# Patient Record
Sex: Female | Born: 1965
Health system: Southern US, Community
[De-identification: ages and names within clinical notes are randomized; demographics above are authoritative.]

## PROBLEM LIST (undated history)

## (undated) DIAGNOSIS — R51 Headache: Secondary | ICD-10-CM

## (undated) DIAGNOSIS — F209 Schizophrenia, unspecified: Secondary | ICD-10-CM

## (undated) DIAGNOSIS — Z973 Presence of spectacles and contact lenses: Secondary | ICD-10-CM

## (undated) DIAGNOSIS — N840 Polyp of corpus uteri: Secondary | ICD-10-CM

## (undated) DIAGNOSIS — F32A Depression, unspecified: Secondary | ICD-10-CM

## (undated) DIAGNOSIS — M545 Low back pain, unspecified: Secondary | ICD-10-CM

## (undated) DIAGNOSIS — R519 Headache, unspecified: Secondary | ICD-10-CM

## (undated) DIAGNOSIS — F200 Paranoid schizophrenia: Secondary | ICD-10-CM

## (undated) DIAGNOSIS — F329 Major depressive disorder, single episode, unspecified: Secondary | ICD-10-CM

## (undated) DIAGNOSIS — I8390 Asymptomatic varicose veins of unspecified lower extremity: Secondary | ICD-10-CM

## (undated) DIAGNOSIS — F419 Anxiety disorder, unspecified: Secondary | ICD-10-CM

## (undated) DIAGNOSIS — R7303 Prediabetes: Secondary | ICD-10-CM

## (undated) DIAGNOSIS — K5909 Other constipation: Secondary | ICD-10-CM

## (undated) DIAGNOSIS — F319 Bipolar disorder, unspecified: Secondary | ICD-10-CM

## (undated) DIAGNOSIS — G8929 Other chronic pain: Secondary | ICD-10-CM

## (undated) HISTORY — PX: NO PAST SURGERIES: SHX2092

## (undated) HISTORY — DX: Depression, unspecified: F32.A

## (undated) HISTORY — DX: Low back pain: M54.5

## (undated) HISTORY — DX: Prediabetes: R73.03

## (undated) HISTORY — DX: Paranoid schizophrenia: F20.0

## (undated) HISTORY — DX: Headache: R51

## (undated) HISTORY — DX: Schizophrenia, unspecified: F20.9

## (undated) HISTORY — DX: Low back pain, unspecified: M54.50

## (undated) HISTORY — DX: Major depressive disorder, single episode, unspecified: F32.9

---

## 2000-10-18 ENCOUNTER — Emergency Department (HOSPITAL_COMMUNITY): Admission: EM | Admit: 2000-10-18 | Discharge: 2000-10-18 | Payer: Self-pay | Admitting: Emergency Medicine

## 2000-10-31 ENCOUNTER — Emergency Department (HOSPITAL_COMMUNITY): Admission: EM | Admit: 2000-10-31 | Discharge: 2000-10-31 | Payer: Self-pay | Admitting: Emergency Medicine

## 2000-10-31 ENCOUNTER — Encounter: Payer: Self-pay | Admitting: Emergency Medicine

## 2000-11-24 ENCOUNTER — Other Ambulatory Visit: Admission: RE | Admit: 2000-11-24 | Discharge: 2000-11-24 | Payer: Self-pay | Admitting: Family Medicine

## 2001-07-14 ENCOUNTER — Inpatient Hospital Stay (HOSPITAL_COMMUNITY): Admission: EM | Admit: 2001-07-14 | Discharge: 2001-07-20 | Payer: Self-pay | Admitting: *Deleted

## 2002-09-01 ENCOUNTER — Other Ambulatory Visit: Admission: RE | Admit: 2002-09-01 | Discharge: 2002-09-01 | Payer: Self-pay | Admitting: Obstetrics and Gynecology

## 2003-04-04 ENCOUNTER — Emergency Department (HOSPITAL_COMMUNITY): Admission: EM | Admit: 2003-04-04 | Discharge: 2003-04-04 | Payer: Self-pay | Admitting: Emergency Medicine

## 2003-04-04 ENCOUNTER — Encounter: Payer: Self-pay | Admitting: Emergency Medicine

## 2003-04-16 ENCOUNTER — Emergency Department (HOSPITAL_COMMUNITY): Admission: EM | Admit: 2003-04-16 | Discharge: 2003-04-17 | Payer: Self-pay | Admitting: *Deleted

## 2004-01-14 ENCOUNTER — Emergency Department (HOSPITAL_COMMUNITY): Admission: EM | Admit: 2004-01-14 | Discharge: 2004-01-14 | Payer: Self-pay | Admitting: Emergency Medicine

## 2004-07-23 ENCOUNTER — Inpatient Hospital Stay (HOSPITAL_COMMUNITY): Admission: RE | Admit: 2004-07-23 | Discharge: 2004-07-27 | Payer: Self-pay | Admitting: Psychiatry

## 2004-11-28 ENCOUNTER — Ambulatory Visit: Payer: Self-pay | Admitting: Internal Medicine

## 2005-01-20 ENCOUNTER — Ambulatory Visit: Payer: Self-pay | Admitting: Internal Medicine

## 2005-04-10 ENCOUNTER — Emergency Department (HOSPITAL_COMMUNITY): Admission: EM | Admit: 2005-04-10 | Discharge: 2005-04-11 | Payer: Self-pay | Admitting: Emergency Medicine

## 2005-05-07 ENCOUNTER — Ambulatory Visit: Payer: Self-pay | Admitting: Internal Medicine

## 2005-07-22 ENCOUNTER — Ambulatory Visit: Payer: Self-pay | Admitting: Internal Medicine

## 2005-07-28 ENCOUNTER — Ambulatory Visit: Payer: Self-pay | Admitting: Internal Medicine

## 2006-08-25 ENCOUNTER — Ambulatory Visit: Payer: Self-pay | Admitting: Internal Medicine

## 2006-11-24 ENCOUNTER — Encounter: Payer: Self-pay | Admitting: Internal Medicine

## 2006-11-24 ENCOUNTER — Ambulatory Visit: Payer: Self-pay | Admitting: Internal Medicine

## 2006-11-24 ENCOUNTER — Other Ambulatory Visit: Admission: RE | Admit: 2006-11-24 | Discharge: 2006-11-24 | Payer: Self-pay | Admitting: Internal Medicine

## 2007-02-04 ENCOUNTER — Ambulatory Visit: Payer: Self-pay | Admitting: Internal Medicine

## 2007-04-12 ENCOUNTER — Ambulatory Visit: Payer: Self-pay | Admitting: Internal Medicine

## 2007-04-12 LAB — CONVERTED CEMR LAB
ALT: 18 units/L (ref 0–40)
AST: 26 units/L (ref 0–37)
Albumin: 3.6 g/dL (ref 3.5–5.2)
Alkaline Phosphatase: 48 units/L (ref 39–117)
Basophils Absolute: 0 10*3/uL (ref 0.0–0.1)
Basophils Relative: 0.6 % (ref 0.0–1.0)
Bilirubin, Direct: 0.1 mg/dL (ref 0.0–0.3)
Eosinophils Absolute: 0 10*3/uL (ref 0.0–0.6)
Eosinophils Relative: 0.7 % (ref 0.0–5.0)
HCT: 37.7 % (ref 36.0–46.0)
Hemoglobin: 13.2 g/dL (ref 12.0–15.0)
Lymphocytes Relative: 25.4 % (ref 12.0–46.0)
MCHC: 35 g/dL (ref 30.0–36.0)
MCV: 92.7 fL (ref 78.0–100.0)
Monocytes Absolute: 0.5 10*3/uL (ref 0.2–0.7)
Monocytes Relative: 11.1 % — ABNORMAL HIGH (ref 3.0–11.0)
Neutro Abs: 3.2 10*3/uL (ref 1.4–7.7)
Neutrophils Relative %: 62.2 % (ref 43.0–77.0)
Phenytoin Lvl: 0.4 ug/mL — ABNORMAL LOW (ref 10.0–20.0)
Platelets: 189 10*3/uL (ref 150–400)
RBC: 4.07 M/uL (ref 3.87–5.11)
RDW: 12.7 % (ref 11.5–14.6)
Total Bilirubin: 0.4 mg/dL (ref 0.3–1.2)
Total Protein: 6.4 g/dL (ref 6.0–8.3)
Valproic Acid Lvl: 107.5 ug/mL — ABNORMAL HIGH (ref 50.0–100.0)
WBC: 4.9 10*3/uL (ref 4.5–10.5)

## 2007-06-03 ENCOUNTER — Ambulatory Visit: Payer: Self-pay | Admitting: Internal Medicine

## 2007-06-03 LAB — CONVERTED CEMR LAB: Phenytoin Lvl: 0.7 ug/mL — ABNORMAL LOW (ref 10.0–20.0)

## 2007-06-09 ENCOUNTER — Ambulatory Visit: Payer: Self-pay | Admitting: Internal Medicine

## 2007-09-05 ENCOUNTER — Ambulatory Visit: Payer: Self-pay | Admitting: Internal Medicine

## 2007-09-05 DIAGNOSIS — M545 Low back pain, unspecified: Secondary | ICD-10-CM | POA: Insufficient documentation

## 2007-09-05 LAB — CONVERTED CEMR LAB
BUN: 6 mg/dL (ref 6–23)
CO2: 28 meq/L (ref 19–32)
Calcium: 9.4 mg/dL (ref 8.4–10.5)
Chloride: 108 meq/L (ref 96–112)
Creatinine, Ser: 1 mg/dL (ref 0.4–1.2)
GFR calc Af Amer: 79 mL/min
GFR calc non Af Amer: 65 mL/min
Glucose, Bld: 87 mg/dL (ref 70–99)
Hgb A1c MFr Bld: 5.7 % (ref 4.6–6.0)
Potassium: 3.5 meq/L (ref 3.5–5.1)
Sodium: 141 meq/L (ref 135–145)

## 2007-10-17 ENCOUNTER — Telehealth: Payer: Self-pay | Admitting: Internal Medicine

## 2007-10-25 ENCOUNTER — Telehealth (INDEPENDENT_AMBULATORY_CARE_PROVIDER_SITE_OTHER): Payer: Self-pay | Admitting: *Deleted

## 2007-12-05 ENCOUNTER — Ambulatory Visit: Payer: Self-pay | Admitting: Internal Medicine

## 2007-12-05 DIAGNOSIS — B372 Candidiasis of skin and nail: Secondary | ICD-10-CM

## 2008-01-02 ENCOUNTER — Telehealth: Payer: Self-pay | Admitting: Internal Medicine

## 2008-01-25 ENCOUNTER — Telehealth (INDEPENDENT_AMBULATORY_CARE_PROVIDER_SITE_OTHER): Payer: Self-pay | Admitting: *Deleted

## 2008-03-05 ENCOUNTER — Ambulatory Visit: Payer: Self-pay | Admitting: Internal Medicine

## 2008-03-22 ENCOUNTER — Telehealth: Payer: Self-pay | Admitting: Internal Medicine

## 2008-04-06 ENCOUNTER — Telehealth: Payer: Self-pay | Admitting: Internal Medicine

## 2008-06-04 ENCOUNTER — Ambulatory Visit: Payer: Self-pay | Admitting: Internal Medicine

## 2008-06-04 DIAGNOSIS — R51 Headache: Secondary | ICD-10-CM

## 2008-06-04 DIAGNOSIS — R519 Headache, unspecified: Secondary | ICD-10-CM | POA: Insufficient documentation

## 2008-06-04 LAB — CONVERTED CEMR LAB: Beta hcg, urine, semiquantitative: NEGATIVE

## 2008-08-13 ENCOUNTER — Telehealth: Payer: Self-pay | Admitting: Internal Medicine

## 2008-08-16 ENCOUNTER — Telehealth: Payer: Self-pay | Admitting: Internal Medicine

## 2008-10-29 ENCOUNTER — Telehealth: Payer: Self-pay | Admitting: Internal Medicine

## 2008-11-14 ENCOUNTER — Telehealth: Payer: Self-pay | Admitting: Internal Medicine

## 2008-11-23 ENCOUNTER — Ambulatory Visit: Payer: Self-pay | Admitting: Internal Medicine

## 2008-11-26 ENCOUNTER — Ambulatory Visit: Payer: Self-pay | Admitting: Internal Medicine

## 2008-11-29 LAB — CONVERTED CEMR LAB
Preg, Serum: NEGATIVE
Prolactin: 32.4 ng/mL

## 2009-04-15 ENCOUNTER — Encounter: Payer: Self-pay | Admitting: Internal Medicine

## 2009-06-12 ENCOUNTER — Ambulatory Visit: Payer: Self-pay | Admitting: Internal Medicine

## 2009-06-12 ENCOUNTER — Telehealth (INDEPENDENT_AMBULATORY_CARE_PROVIDER_SITE_OTHER): Payer: Self-pay | Admitting: *Deleted

## 2009-06-13 LAB — CONVERTED CEMR LAB
ALT: 22 units/L (ref 0–35)
AST: 25 units/L (ref 0–37)
Alkaline Phosphatase: 53 units/L (ref 39–117)
Basophils Absolute: 0 10*3/uL (ref 0.0–0.1)
Eosinophils Relative: 1.2 % (ref 0.0–5.0)
HCT: 39.8 % (ref 36.0–46.0)
Hemoglobin: 13.9 g/dL (ref 12.0–15.0)
Lymphocytes Relative: 27.8 % (ref 12.0–46.0)
Monocytes Relative: 11.9 % (ref 3.0–12.0)
Neutro Abs: 3.5 10*3/uL (ref 1.4–7.7)
Platelets: 198 10*3/uL (ref 150.0–400.0)
RDW: 12.7 % (ref 11.5–14.6)
Total Bilirubin: 0.7 mg/dL (ref 0.3–1.2)
WBC: 6 10*3/uL (ref 4.5–10.5)

## 2009-07-08 ENCOUNTER — Telehealth (INDEPENDENT_AMBULATORY_CARE_PROVIDER_SITE_OTHER): Payer: Self-pay | Admitting: *Deleted

## 2009-09-18 ENCOUNTER — Telehealth: Payer: Self-pay | Admitting: Internal Medicine

## 2010-02-21 ENCOUNTER — Ambulatory Visit: Payer: Self-pay | Admitting: Internal Medicine

## 2010-05-30 ENCOUNTER — Telehealth (INDEPENDENT_AMBULATORY_CARE_PROVIDER_SITE_OTHER): Payer: Self-pay | Admitting: *Deleted

## 2010-06-01 ENCOUNTER — Ambulatory Visit (HOSPITAL_COMMUNITY): Admission: RE | Admit: 2010-06-01 | Discharge: 2010-06-01 | Payer: Self-pay | Admitting: Psychiatry

## 2010-06-02 ENCOUNTER — Ambulatory Visit: Payer: Self-pay | Admitting: Psychiatry

## 2010-06-02 ENCOUNTER — Inpatient Hospital Stay (HOSPITAL_COMMUNITY): Admission: EM | Admit: 2010-06-02 | Discharge: 2010-06-03 | Payer: Self-pay | Admitting: Emergency Medicine

## 2010-06-02 ENCOUNTER — Encounter (INDEPENDENT_AMBULATORY_CARE_PROVIDER_SITE_OTHER): Payer: Self-pay | Admitting: Internal Medicine

## 2010-06-03 ENCOUNTER — Ambulatory Visit: Payer: Self-pay | Admitting: Psychiatry

## 2011-01-22 NOTE — Progress Notes (Signed)
Summary: FYI  Phone Note Call from Patient   Caller: Patient Call For: Stacie Glaze MD Summary of Call: Pt has been vomiting all morning, and is having multiple BMs that are formed.  No fever. [phone] Feels better. Initial call taken by: Lynann Beaver CMA,  May 30, 2010 1:21 PM  Follow-up for Phone Call        clear liquids for 48 h ours per dr j enkins Follow-up by: Willy Eddy, LPN,  May 30, 2010 1:52 PM  Additional Follow-up for Phone Call Additional follow up Details #1::        Pt. notified. Additional Follow-up by: Lynann Beaver CMA,  May 30, 2010 2:01 PM

## 2011-03-09 LAB — COMPREHENSIVE METABOLIC PANEL
ALT: 16 U/L (ref 0–35)
AST: 19 U/L (ref 0–37)
CO2: 25 mEq/L (ref 19–32)
Chloride: 107 mEq/L (ref 96–112)
GFR calc Af Amer: >60 mL/min (ref >60)
GFR calc non Af Amer: >60 mL/min (ref >60)
Sodium: 138 mEq/L (ref 135–145)
Total Bilirubin: 0.8 mg/dL (ref 0.3–1.2)

## 2011-03-09 LAB — CARDIAC PANEL(CRET KIN+CKTOT+MB+TROPI)
CK, MB: 2.3 ng/mL (ref 0.3–4.0)
Total CK: 125 U/L (ref 7–177)

## 2011-03-09 LAB — CK TOTAL AND CKMB (NOT AT ARMC)
CK, MB: 3.4 ng/mL (ref 0.3–4.0)
Relative Index: 1.9 (ref 0.0–2.5)

## 2011-03-09 LAB — LIPID PANEL
Cholesterol: 164 mg/dL (ref 0–200)
Total CHOL/HDL Ratio: 4.3 RATIO

## 2011-03-09 LAB — CBC
MCHC: 33.5 g/dL (ref 30.0–36.0)
MCV: 89.8 fL (ref 78.0–100.0)
Platelets: 208 10*3/uL (ref 150–400)
RBC: 4.07 MIL/uL (ref 3.87–5.11)
RDW: 13.4 % (ref 11.5–15.5)
WBC: 9 10*3/uL (ref 4.0–10.5)

## 2011-03-09 LAB — POCT CARDIAC MARKERS
CKMB, poc: 3.2 ng/mL (ref 1.0–8.0)
Myoglobin, poc: 95.9 ng/mL (ref 12–200)

## 2011-03-09 LAB — DIFFERENTIAL
Basophils Absolute: 0.1 10*3/uL (ref 0.0–0.1)
Basophils Relative: 1 % (ref 0–1)
Eosinophils Absolute: 0.1 10*3/uL (ref 0.0–0.7)
Monocytes Relative: 11 % (ref 3–12)
Neutrophils Relative %: 61 % (ref 43–77)

## 2011-03-09 LAB — RAPID URINE DRUG SCREEN, HOSP PERFORMED
Barbiturates: NOT DETECTED
Opiates: NOT DETECTED
Tetrahydrocannabinol: NOT DETECTED

## 2011-03-09 LAB — BASIC METABOLIC PANEL
BUN: 4 mg/dL — ABNORMAL LOW (ref 6–23)
CO2: 25 mEq/L (ref 19–32)
Calcium: 9.5 mg/dL (ref 8.4–10.5)
Chloride: 105 mEq/L (ref 96–112)
Creatinine, Ser: 0.88 mg/dL (ref 0.4–1.2)
Glucose, Bld: 106 mg/dL — ABNORMAL HIGH (ref 70–99)

## 2011-03-09 LAB — T3, FREE: T3, Free: 2.8 pg/mL (ref 2.3–4.2)

## 2011-05-05 NOTE — Assessment & Plan Note (Signed)
[facility]                                 ON-CALL NOTE   Charlotte [name], Charlotte [name]                       MRN:          [id number]  DATE:09/05/2007                            DOB:          [birth year]    Patient of Dr. Darryll Capers   PHONE NUMBER:  [phone]   Phone call came at 5:52 p.m. on September 05, 2007.  She was in for a  Depo-Provera shot today.  The nurse apparently got the wrong medication  at first, did not give it to her, told her she had the wrong medicine,  went and got the right medicine, came back and gave it to her.  She is  now concerned because she is afraid that maybe she got the wrong  medicine and is calling kind of anxious.   PLAN:  I told her that is extremely unlikely to have gotten the wrong  medicine when the nurse realized she had the wrong medicine first and  went back to get the right one.  I told her she would be really on alert  at that point and tried to reassure that would be unlikely.  I told her  that she could call tomorrow to confirm what the medicine was and the  lot number and all that if she wanted to be sure of it but I told her  that it was unlikely once the nurse had recognized an error and had not  given her the wrong medicine that she would once again get the wrong  medicine for her.     Karie Schwalbe, MD  Electronically Signed    RIL/MedQ  DD: 09/05/2007  DT: 09/06/2007  Job #: [id number]

## 2011-05-08 NOTE — Discharge Summary (Signed)
Charlotte [name], Charlotte [name]                          ACCOUNT NO.:  [id number]   MEDICAL RECORD NO.:  [id number]                   PATIENT TYPE:  IPS   LOCATION:  0500                                 FACILITY[facility]  [facility]   PHYSICIAN:  Geoffery Lyons, M.D.                   DATE OF BIRTH:  1966-01-18   DATE OF ADMISSION:  07/23/2004  DATE OF DISCHARGE:  07/27/2004                                 DISCHARGE SUMMARY   CHIEF COMPLAINT AND PRESENT ILLNESS:  This was the second admission to [facility] for this 45 year old married white female voluntarily  admitted.  History of psychosis.  Experiencing positive visual  hallucinations, seeing Mini-Wheats with faces.  Hearing three songs running  through her head all the time.  Also been seeing faces in the microwave as  well as in her bathroom.  Seeing six different faces on her curtains, the  faces of the devil, Jesus and a rock star.  She started seeing these the  Sunday before this admission.  Claimed she had been compliant with her  medication.  She claimed that she felt overwhelmed with needy friends and  neighbors.  Apparently, her husband has to get up in the middle of the night  to do different things for these people.   PAST PSYCHIATRIC HISTORY:  Second time at [facility].  Was admitted  in 2002.  Had been having thoughts at that time to kill her husband.  No  current outpatient treatment.   ALCOHOL/DRUG HISTORY:  Denies the use or abuse any substances.   PAST MEDICAL HISTORY:  Bulimia.  No other medical conditions.   MEDICATIONS:  Prolixin 5 mg twice a day, Cogentin 1 mg twice a day.   PHYSICAL EXAMINATION:  Performed and failed to show any acute findings.   LABORATORY DATA:  CBC with white blood cell count 11. 6.  Urine pregnancy  test was negative.  Urine drug screen was negative.  TSH was 2.751.   MENTAL STATUS EXAM:  Alert, cooperative female with good eye contact.  Casually dressed.  Speech was clear.  Pleasant,  does not appear to be  stressed by internal stimuli.  Very bright.  Thought processes were positive  for endorsing auditory and visual hallucinations.  No current suicidal or  homicidal ideation.  No evidence of delusional ideas.  Cognition well-  preserved.   ADMISSION DIAGNOSES:   AXIS I:  Psychotic disorder not otherwise specified.   AXIS II:  No diagnosis.   AXIS III:  No diagnosis.   AXIS IV:  Moderate.   AXIS V:  Global Assessment of Functioning upon admission 35; highest Global  Assessment of Functioning in the last year 60-65.   HOSPITAL COURSE:  She was admitted and started intensive individual and  group psychotherapy.  She was placed on Ambien at bedtime for sleep.  She  was maintained on Prolixin 5 mg  twice a day and Cogentin 1 mg twice a day.  Prolixin was increased to 5 mg three times a day.  She was given some  Klonopin for some anxiety.  Endorsed again that the main stressor was  increased demands and expectations from the neighbors and how the husband  has been responding and feeling entitled, hard time setting limits.  As she  was able to open up and discuss all these things, she claimed that she was  feeling better.  She used to use 20 mg of Prolixin and felt that does help  her more.  By August 7th, she continued to improve.  She was able to  tolerate the Prolixin well without any signs of extrapyramidal reactions.  Her mood improved.  Her affect was brighter.  Denied having any more  hallucinations.  Had been able to discuss the stressors with her husband and  they were both coming to some resolutions, setting limits.  She was looking  forward to being discharged and pursuing the relationship with the husband.  She was going to follow up on an outpatient basis.   DISCHARGE DIAGNOSES:   AXIS I:  Psychotic disorder not otherwise specified.   AXIS II:  No diagnosis.   AXIS III:  No diagnosis.   AXIS IV:  Moderate.   AXIS V:  Global Assessment of  Functioning upon discharge 55.   DISCHARGE MEDICATIONS:  1.  Prolixin 5 mg three times a day.  2.  Cogentin 1 mg twice a day.   FOLLOW UP:  Dr. Lovell Sheehan, [facility], and Dr. Lolly Mustache at [facility].                                               Geoffery Lyons, M.D.    IL/MEDQ  D:  08/20/2004  T:  08/21/2004  Job:  [id number]

## 2011-05-08 NOTE — H&P (Signed)
Charlotte [name], Charlotte [name]                          ACCOUNT NO.:  [id number]   MEDICAL RECORD NO.:  [id number]                   PATIENT TYPE:  IPS   LOCATION:  0500                                 FACILITY:  [facility]   PHYSICIAN:  Geoffery Lyons, M.D.                   DATE OF BIRTH:  05-20-1966   DATE OF ADMISSION:  07/23/2004  DATE OF DISCHARGE:                         PSYCHIATRIC ADMISSION ASSESSMENT   IDENTIFYING INFORMATION:  This is a 45 year old married white female  voluntarily admitted on July 23, 2004.   HISTORY OF PRESENT ILLNESS:  The patient presents with a history of  psychosis, experiencing positive visual hallucinations, seeing Mini-Wheats  with faces on, hearing three songs running through her head all the time.  The patient states she has also been seeing faces in the microwave as well  as in her bathroom, seeing six different faces on her curtains, with the  faces of the devil, Jesus and a rock star.  The patient states she started  seeing these the Sunday before this admission.  She reports she has been  compliant with her medications.  Her stressors include that patient feels  overwhelmed with needy friends and neighbors.  Her husband has to get up in  the middle of the night and do different things for these people.  She  states she has been sleeping well.  She does report a history of bulimia.  She reports that she, at times, wants to hurt herself when she gets angry  with the mother but is not experiencing at this time.   PAST PSYCHIATRIC HISTORY:  Second admission to [facility].  Was here  in 2002.  She states she was having thoughts at the time to kill her  husband.  No outpatient treatment.  Has a history of overdosing on Tylenol  and aspirin.  She reports she has had an overdose at least 14 times.   SOCIAL HISTORY:  This is a 45 year old married white female, married for  three years, no children.  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   FAMILY  HISTORY:  Unclear.   ALCOHOL/DRUG HISTORY:  Nonsmoker.  Denies any alcohol or drug use.   PRIMARY CARE PHYSICIAN:  Dr. Darryll Capers.   MEDICAL PROBLEMS:  The patient reports a history of bulimia.  She states she  drinks a lot of fluids right before eating and then when she does eat, she  starts to vomit.  She has not been doing anything very currently.   MEDICATIONS:  Has been on Prolixin 5 mg b.i.d. and Cogentin 1 mg b.i.d.  Again, has been compliant.   ALLERGIES:  SULFA, RISPERDAL.  She states with RISPERDAL she was having  hallucinations to hurt her husband.   PHYSICAL EXAMINATION:  GENERAL:  She is an overweight female in no acute  distress.  CHEST:  Clear.  HEART:  Regular rate and rhythm.  NEUROLOGIC:  Findings are intact.  Nonfocal findings.  VITAL SIGNS:  Stable.  Temperature 97.1, heart rate 93, respirations 16,  blood pressure 135/91, 5 feet 6 inches tall, 280 pounds.   LABORATORY DATA:  CBC shows a white count of 11.6.  Urine pregnancy test was  negative.  Urine drug screen was negative.  Urinalysis was negative.  TSH  was 2.750.   MENTAL STATUS EXAM:  Alert, oriented.  Good eye contact.  Casually dressed.  Speech is clear.  The patient is pleasant.  She does not appear to be  stressed by any internal stimuli.  She is very bright.  Thought processes  are with patient endorsing auditory and visual hallucinations.  No current  suicidal or homicidal thoughts.  Cognitive function is intact.  Memory is  fair.  Judgment is fair.  Insight is fair.  Somewhat of a poor historian.   DIAGNOSES:   AXIS I:  1. Psychosis not otherwise specified.  2. History of bulimia.  3. Rule out anxiety disorder not otherwise specified.   AXIS II:  Deferred.   AXIS III:  None.   AXIS IV:  Deferred.   AXIS V:  Current 35; estimated this past year 60-65.   PLAN:  Admission for psychosis.  Contract for safety.  Stabilize mood and  thinking.  Will increase Prolixin to decrease psychotic  symptoms.  The  patient is to increase her coping skills.  Attend individual and group  therapy.  Will have a family session with husband for baseline and support.  The patient to follow up with Dr. Lovell Sheehan.  The patient may need some  individual therapy.   TENTATIVE LENGTH OF STAY:  Three to five days.     Landry Corporal, N.P.                       Geoffery Lyons, M.D.    JO/MEDQ  D:  07/25/2004  T:  07/25/2004  Job:  [id number]

## 2011-05-08 NOTE — H&P (Signed)
[facility]  Patient:    Charlotte [name], Charlotte [name]                     MRN: [id number] Adm. Date:  [id number] Attending:  Denny Peon Dictator:   Landry Corporal, N.P.                   Psychiatric Admission Assessment  IDENTIFYING INFORMATION:  This is a 45 year old married white female voluntary admit to [facility] on July 14, 2001 for psychosis.  HISTORY OF PRESENT ILLNESS:  The patient presents with a  history of psychotic behavior, having auditory hallucinations.  Patients voices are telling her to hurt her husband, for the past 3 weeks.  Also experiencing visual hallucinations with an arm reaching out from a chair.  Patient attributes these hallucinations to a decrease in the dosage of Risperdal and a change from Prolixin to Risperdal.  Patient reports some depressive symptoms as well. No suicidal or homicidal ideations currently.  Patient was started on Prozac about 2 weeks ago.  She also reports some vivid dreams, denying any current auditory or visual hallucinations.  Her appetite has been satisfactory.  She reports some financial stress in her life.  PAST PSYCHIATRIC HISTORY:  Patient has a history of schizophrenia diagnosed in 49.  This is the first admission to [facility].  On an outpatient basis, she sees Harolyn Rutherford and Dr. Hortencia Pilar.  She had one other admission to [facility] in 1992.  SOCIAL HISTORY:  She is a 45 year old married white female, married for 6 months.  She has no children.  She is on disability.  She lives with her husband.  She has no legal problems.  She has completed 3rd semester at [employer].  FAMILY HISTORY:  She has a brother and father who are alcoholics.  ALCOHOL DRUG HISTORY:  Nonsmoker, denies any alcohol or substance abuse.  PAST MEDICAL HISTORY:  Primary care Rylen Hou is Dr. Lovell Sheehan in [place]. Medical problems are none.  Medications are Prozac 20 mg every day,  decreasing doses of Risperdal 2 mg and birth control pills.  Patient was on Depo-Provera and now on birth control pills.  DRUG ALLERGIES:  SULFA.  REVIEW OF SYSTEMS:  Patient reports no fever or chills or changes in her appetite.  Patient has some impaired vision.  She wears magnifying glasses as regular glasses, has not had an eye exam in several years.  Patient reports some ringing in her ears, no hearing loss.  CARDIO:  No chest pain or palpitations or any type of arrhythmias, no history of hypertension. RESPIRATORY:  She is a nonsmoker, no shortness of breath or cough.  GI: History of hiatal hernia with some constipation and diarrhea.  Patient gets reflux when she eats late at night.  Patient reports frequency and urgency when anxious.  MUSCULOSKELETAL:  Joint pain in her wrists, more so in the right than left.  SKIN: No bruising, open wounds or eczema.  NEUROLOGIC: History of headaches.  PSYCHIATRIC:  Paranoid schizophrenia.  ENDOCRINE: No thyroid or diabetic problems.  LYMPHATIC:  No enlarged or tender nodes. ALLERGIES:  No environmental allergies.  PHYSICAL EXAMINATION:  Patient is 5 feet 5-1/2 inches tall.  She is 187 pounds. Her vital signs:  96.4, 77, 20.  Blood pressure is 122/78.  GENERAL APPEARANCE:  Patient is a 45 year old  Caucasian female in no acute distress.  She is well-groomed, alert and cooperative.  HEAD:  Normocephalic.  She can raise  her eyebrows.  Her hair is bobbed, very dark black, of even distribution.  Her EOMs are intact bilaterally.  Her external ear canals are patent.  Her hearing is appropriate to conversation. There is no sinus tenderness, no nasal discharge.  Mucosa is moist.  Good dentition.  No lesions were seen.  Her tongue protrudes midline without tremor.  NECK:  Supple with full range of motion, no JVD.  Negative lymphadenopathy. Thyroid is nonpalpable, nontender, not enlarged.  Trachea is midline.  RESPIRATORY:  Clear to auscultation, no  adventitious sounds.  No cough.  CARDIOVASCULAR:  Heart rate is regular rate and rhythm without murmurs. Carotid pulses are equal and adequate bilaterally.  No edema was noted.  BREAST EXAM was deferred.  ABDOMEN:  Soft, nontender abdomen.  No CVA tenderness.  MUSCULOSKELETAL:  No joint swelling or deformity, good range of motion. Muscle strength and tone is equal bilaterally.  No signs of injuries.  SKIN:  Warm and dry with good turgor.  Nail beds are pink with good capillary refill.  NEUROLOGICAL:  She is oriented x 3.  Her cranial nerves are grossly intact. Good grip strength bilaterally.  No involuntary movements.  Cerebellar function is intact.  Romberg is negative.  Health maintenance issues were addressed.  MENTAL STATUS EXAMINATION:  She is an alert, young adult, overweight Caucasian female with jet black hair.  Very cooperative.  Good eye contact.  Speech is normal and relevant.  Mood is pleasant.  Some mild anxiety.  Affect is pleasant, with patient appearing mildly depressed.  Thought process is coherent.  No evidence of psychosis, no current auditory or visual hallucinations, no current suicidal or homicidal ideations, no paranoia. Cognitive function is intact.  Her memory is fair, judgment is fair, insight is fair.  ADMISSION DIAGNOSES: Axis I:    1. Psychosis not otherwise specified.            2. Rule out depression with psychotic features, paranoid               schizophrenia. Axis II:   Deferred. Axis III:  None. Axis IV:   Mild with other psychosocial problems related to her psychiatric            illness, economic problem. Axis V:    Current 40, estimated this past year 30.  INITIAL PLAN OF CARE:  Voluntary admission to [facility] for psychotic behavior.  Contract for safety, check every 15 minutes.  Patient agrees to be safe.  We will d/c her Risperdal, will resume her Prolixin, will obtain labs and have patient attend groups.  Our goal is  to stabilize her mood and thinking so patient and others can be safe for patient to return to prior  living arrangements, to follow with [facility].  TENTATIVE LENGTH OF STAY:  Three to four days. DD:  07/15/01 TD:  07/17/01 Job: [id number]

## 2011-05-08 NOTE — Discharge Summary (Signed)
[facility]  Patient:    JANNAT, ROSEMEYER Visit Number: [id number] MRN: [id number]          Service Type: PSY Location: 40 [id number] Attending Physician:  Denny Peon Dictated by:   Netta Cedars, M.D. Admit Date:  07/14/2001 Discharge Date: 07/20/2001                             Discharge Summary  INTRODUCTION:  Shivonne Schwartzman is a 45 year old white married female who was admitted because of signs of psychosis.  She presented with psychotic behavior, having auditory hallucinations and hearing a voice telling her to hurt her husband, for past 3 weeks.  She also had some visual hallucinations. Patient attributed these symptoms to decreasing the doses of Risperdal and change from Prolixin to Risperdal.  At the time of admission she also had some depressive symptoms, denied at the point of evaluation suicidal or homicidal ideations.  The patient has a history of schizophrenia diagnosed in 1.  She does not have any history of substance abuse and denies present use of alcohol or drugs.  MEDICAL PROBLEMS:  The patient is followed by Dr. Lovell Sheehan in [place], not having any medical problems.  She is on Depo-Provera and her others medications are Prozac 20 mg daily and Risperdal 2 mg at night.  Initial examination showed mild anxiety, presence of auditory hallucinations, lack of suicidal or homicidal ideations and some guarding, but not overt paranoia.  HOSPITAL COURSE:  After being admitted to the ward, patient was placed on special observation.  She was started on Prolixin on a p.r.n. basis 2 mg b.i.d. and 2 mg on a p.r.n. basis.  On July 26, Prozac was added.  The patient was also started on Risperdal 2 mg twice  a day.  Since she linked deterioration of her symptoms in switching from Prolixin to Risperdal, I discontinued Risperdal and instead increased Prolixin.  Because the patient had some urinary tract infection, Cipro 250 mg twice a  day x 3 days was ordered.  On July 28, the patient experienced some side effect dystonia-type of her medication and Cogentin was added 1 mg twice a day with good effect. On July 30, the patient felt better, no hallucinations, no dangerous ideas, still some problems infected judgment, but overall grossly improved.  Stable, I decided to discharge her on July 31 home.  Social worker met with patient and her husband and patient felt that she had made progress and could be ready to discharge home.  On July 31, patient was free from psychosis, no side effects from  medications.  She slept well.  I instructed the patient to check herself for abnormal movements and explained the risk for tardive dyskinesia, since she is back on Prolixin.  I felt that long-term possible side effects outweighed benefit from treating adequately her psychosis.  The patient agreed with this and the rest of the family was also compliant.  Before discharge, she felt much better, no depression, no dangerous ideation, no psychosis.  Review of blood work showed borderline elevation of T4, otherwise tyroid is normal.  CBC normal, chemistry profile normal, including renal and kidney function tests.  Drug screen upon admission was negative. Pregnancy test was negative.  Urinalysis was normal upon discharge, but showing many bacteria and white blood cells in the urine.  DISCHARGE DIAGNOSES: Axis I:    Schizophrenia, paranoid type, chronic, exacerbated. Axis II:   No diagnosis.  Axis III:  No diagnosis. Axis IV:   Moderate, psychosocial stressor and exacerbation of illness. Axis V:    Global assessment of function upon admission 35-40,            maximum for past year 65.  DISCHARGE MEDICATIONS: 1. Prozac 20 mg daily. 2. Prolixin 3 mg 3 times a day. 3. Cogentin 1 mg twice a day.  DISCHARGE RECOMMENDATIONS:  The patient was aware of extrapyramidal symptoms and side effects from medication.  She was discharged in good  condition and she promised to be compliant with her treatment.  She felt that Prolixin worked for her much better than Risperdal.  In the event of exacerbation or problems with medication, the patient should call until she sees her therapist and physician at mental health.  The patient received a followup appointment at [facility] on Thursday, August 8, with Harolyn Rutherford at 10 a.m. Dictated by:   Netta Cedars, M.D. Attending Physician:  Denny Peon DD:  09/26/01 TD:  09/26/01 Job: [id number]

## 2011-08-28 ENCOUNTER — Telehealth: Payer: Self-pay | Admitting: Internal Medicine

## 2011-08-28 DIAGNOSIS — E785 Hyperlipidemia, unspecified: Secondary | ICD-10-CM

## 2011-08-28 DIAGNOSIS — Z5181 Encounter for therapeutic drug level monitoring: Secondary | ICD-10-CM

## 2011-08-28 DIAGNOSIS — E039 Hypothyroidism, unspecified: Secondary | ICD-10-CM

## 2011-08-28 DIAGNOSIS — T887XXA Unspecified adverse effect of drug or medicament, initial encounter: Secondary | ICD-10-CM

## 2011-08-28 NOTE — Telephone Encounter (Signed)
Future orders for labs placed give her a lab appointment prior to visit

## 2011-08-28 NOTE — Telephone Encounter (Signed)
Pt has sch and ov to see Dr Lovell Sheehan on 9/18 re: consult on diabetes and thyroid issues. Pt rcvd an order to get Valproic Acid,total and Comp Basic Meta Panel, lipid panel(80061) from [facility]. Pt is wondering if she could get these labs done here at [facility]?

## 2011-09-08 ENCOUNTER — Ambulatory Visit (INDEPENDENT_AMBULATORY_CARE_PROVIDER_SITE_OTHER): Payer: PRIVATE HEALTH INSURANCE | Admitting: Internal Medicine

## 2011-09-08 ENCOUNTER — Encounter: Payer: Self-pay | Admitting: Internal Medicine

## 2011-09-08 DIAGNOSIS — R634 Abnormal weight loss: Secondary | ICD-10-CM

## 2011-09-08 DIAGNOSIS — E785 Hyperlipidemia, unspecified: Secondary | ICD-10-CM

## 2011-09-08 DIAGNOSIS — Z5181 Encounter for therapeutic drug level monitoring: Secondary | ICD-10-CM

## 2011-09-08 DIAGNOSIS — R5381 Other malaise: Secondary | ICD-10-CM

## 2011-09-08 DIAGNOSIS — E039 Hypothyroidism, unspecified: Secondary | ICD-10-CM

## 2011-09-08 DIAGNOSIS — T887XXA Unspecified adverse effect of drug or medicament, initial encounter: Secondary | ICD-10-CM

## 2011-09-08 DIAGNOSIS — H6120 Impacted cerumen, unspecified ear: Secondary | ICD-10-CM

## 2011-09-08 LAB — BASIC METABOLIC PANEL
Chloride: 102 mEq/L (ref 96–112)
GFR: 73.91 mL/min (ref >60.00)
Glucose, Bld: 81 mg/dL (ref 70–99)
Potassium: 4.3 mEq/L (ref 3.5–5.1)
Sodium: 139 mEq/L (ref 135–145)

## 2011-09-08 LAB — HEPATIC FUNCTION PANEL
ALT: 13 U/L (ref 0–35)
AST: 19 U/L (ref 0–37)
Albumin: 3.9 g/dL (ref 3.5–5.2)
Alkaline Phosphatase: 47 U/L (ref 39–117)

## 2011-09-08 LAB — LIPID PANEL
Cholesterol: 149 mg/dL (ref 0–200)
VLDL: 25.2 mg/dL (ref 0.0–40.0)

## 2011-09-08 NOTE — Progress Notes (Signed)
Addended by: Rita Ohara R on: 09/08/2011 03:53 PM   Modules accepted: Orders

## 2011-09-08 NOTE — Progress Notes (Signed)
  Subjective:    Patient ID: Charlotte [name], female    DOB: 02/28/1966, 45 y.o.   MRN: [id number]  HPI Pt is a 45 year old female with chief complaint of weight loss of 8lbs over three months ( but none detected today) and a sence of dry shin and temperature intolerance. She has been see by psychiatry and they manage her medicatons She has hearing loss in the right ear.    Review of Systems  Constitutional: Positive for activity change and unexpected weight change. Negative for appetite change and fatigue.  HENT: Positive for hearing loss. Negative for ear pain, congestion, neck pain, postnasal drip and sinus pressure.   Eyes: Negative for redness and visual disturbance.  Respiratory: Negative for cough, shortness of breath and wheezing.   Gastrointestinal: Negative for abdominal pain and abdominal distention.  Genitourinary: Negative for dysuria, frequency and menstrual problem.  Musculoskeletal: Negative for myalgias, joint swelling and arthralgias.  Skin: Negative for rash and wound.  Neurological: Negative for dizziness, weakness and headaches.  Hematological: Negative for adenopathy. Does not bruise/bleed easily.  Psychiatric/Behavioral: Negative for sleep disturbance and decreased concentration.   Past Medical History  Diagnosis Date  . Headache   . Paranoid schizophrenia   . Depression    No past surgical history on file.  reports that she has quit smoking. She does not have any smokeless tobacco history on file. Her alcohol and drug histories not on file. family history is not on file. Allergies  Allergen Reactions  . Sulfonamide Derivatives     REACTION: Eye irritation       Objective:   Physical Exam  Nursing note and vitals reviewed. Constitutional: She is oriented to person, place, and time. She appears well-developed and well-nourished. No distress.  HENT:  Head: Normocephalic and atraumatic.  Right Ear: External ear normal.  Left Ear: External ear normal.    Nose: Nose normal.  Mouth/Throat: Oropharynx is clear and moist.  Eyes: Conjunctivae and EOM are normal. Pupils are equal, round, and reactive to light.  Neck: Normal range of motion. Neck supple. No JVD present. No tracheal deviation present. No thyromegaly present.  Cardiovascular: Normal rate, regular rhythm, normal heart sounds and intact distal pulses.   No murmur heard. Pulmonary/Chest: Effort normal and breath sounds normal. She has no wheezes. She exhibits no tenderness.  Abdominal: Soft. Bowel sounds are normal.  Musculoskeletal: Normal range of motion. She exhibits no edema and no tenderness.  Lymphadenopathy:    She has no cervical adenopathy.  Neurological: She is alert and oriented to person, place, and time. She has normal reflexes. No cranial nerve deficit.  Skin: Skin is warm and dry. She is not diaphoretic.  Psychiatric: She has a normal mood and affect. Her behavior is normal.          Assessment & Plan:  Wax in ear with hearing loss She  Gave informed consent and the right ear was lavaged until clear and cleared with a cerumen spoon. We will check thyroid labs for fatigue, dry skin and weight loss.

## 2011-09-11 ENCOUNTER — Telehealth: Payer: Self-pay | Admitting: *Deleted

## 2011-09-11 NOTE — Telephone Encounter (Signed)
Left message with normal labs on voice mail.

## 2011-09-11 NOTE — Telephone Encounter (Signed)
All other labs were normal including her cholesterol and her thyroid lab

## 2011-09-11 NOTE — Telephone Encounter (Signed)
Pt is calling for lab results 

## 2011-09-21 ENCOUNTER — Telehealth: Payer: Self-pay | Admitting: *Deleted

## 2011-09-21 NOTE — Telephone Encounter (Signed)
Given normal lab results.

## 2011-09-21 NOTE — Telephone Encounter (Signed)
Calling for lab results. °

## 2011-09-25 ENCOUNTER — Telehealth: Payer: Self-pay | Admitting: *Deleted

## 2011-09-25 NOTE — Telephone Encounter (Signed)
Pt advised.

## 2011-09-25 NOTE — Telephone Encounter (Signed)
Positional dizziness can be many things including dehydration Charlotte [name] to get some Gatorade and drink it over the next few days and see if the dizziness subsides spell her that there is important that she stay hydrated if she is having diarrhea and that is resulting in dizziness that she needs to be seen in the Saturday clinic

## 2011-09-25 NOTE — Telephone Encounter (Signed)
Pt leaves a voice mail on Triage that she gets dizzy everytime she stands up.

## 2011-11-05 ENCOUNTER — Telehealth: Payer: Self-pay | Admitting: Internal Medicine

## 2011-11-05 NOTE — Telephone Encounter (Signed)
Pt said medication is not helping her and would like to come in for labs please contact patient.

## 2011-11-06 ENCOUNTER — Other Ambulatory Visit (INDEPENDENT_AMBULATORY_CARE_PROVIDER_SITE_OTHER): Payer: PRIVATE HEALTH INSURANCE

## 2011-11-06 ENCOUNTER — Telehealth: Payer: Self-pay | Admitting: Internal Medicine

## 2011-11-06 DIAGNOSIS — F259 Schizoaffective disorder, unspecified: Secondary | ICD-10-CM

## 2011-11-06 DIAGNOSIS — Z79899 Other long term (current) drug therapy: Secondary | ICD-10-CM

## 2011-11-06 NOTE — Telephone Encounter (Signed)
Rcvd faxed script from [facility] for Depakote lvs and valproic acid dx v56.69 and 295.70. Bonnye said to go ahead and sch pt for these labs this afternoon. Pts spouse has been called and pt has been sch for today at 2pm, as noted.

## 2011-11-06 NOTE — Telephone Encounter (Signed)
Pt had ov in September-Left message on machine Have pscyh  To put on script what labs they want with dx code an d we will be glad to draw labs

## 2011-11-06 NOTE — Telephone Encounter (Signed)
Ok to order today asap when you get order and dx code-thanks

## 2011-11-06 NOTE — Telephone Encounter (Signed)
[facility] is faxing of lab order for Depakote lvls checked. Pt needs to have these labs drawn today. Orders are coming from Dr Juanda Crumble office. Pls advise.

## 2011-11-07 LAB — VALPROIC ACID LEVEL: Valproic Acid Lvl: 128.2 ug/mL — ABNORMAL HIGH (ref 50.0–100.0)

## 2011-11-18 ENCOUNTER — Telehealth: Payer: Self-pay | Admitting: *Deleted

## 2011-11-18 NOTE — Telephone Encounter (Signed)
Patient calling for lab results and faxed to [phone]

## 2011-11-18 NOTE — Telephone Encounter (Signed)
Done

## 2011-12-09 ENCOUNTER — Ambulatory Visit: Payer: PRIVATE HEALTH INSURANCE | Admitting: Internal Medicine

## 2011-12-17 ENCOUNTER — Encounter: Payer: Self-pay | Admitting: Internal Medicine

## 2011-12-18 ENCOUNTER — Ambulatory Visit: Payer: PRIVATE HEALTH INSURANCE | Admitting: Internal Medicine

## 2011-12-30 ENCOUNTER — Emergency Department (HOSPITAL_COMMUNITY)
Admission: EM | Admit: 2011-12-30 | Discharge: 2012-01-01 | Disposition: A | Discharge status: Other Acute Inpatient Hospital | Payer: PRIVATE HEALTH INSURANCE | Source: Home / Self Care | Attending: Emergency Medicine | Admitting: Emergency Medicine

## 2011-12-30 ENCOUNTER — Encounter (HOSPITAL_COMMUNITY): Payer: Self-pay | Admitting: *Deleted

## 2011-12-30 DIAGNOSIS — F209 Schizophrenia, unspecified: Secondary | ICD-10-CM | POA: Insufficient documentation

## 2011-12-30 DIAGNOSIS — Z79899 Other long term (current) drug therapy: Secondary | ICD-10-CM | POA: Insufficient documentation

## 2011-12-30 LAB — COMPREHENSIVE METABOLIC PANEL
AST: 15 U/L (ref 0–37)
Albumin: 3.9 g/dL (ref 3.5–5.2)
Chloride: 103 mEq/L (ref 96–112)
Creatinine, Ser: 0.87 mg/dL (ref 0.50–1.10)
Total Bilirubin: 0.2 mg/dL — ABNORMAL LOW (ref 0.3–1.2)
Total Protein: 7.2 g/dL (ref 6.0–8.3)

## 2011-12-30 LAB — CBC
MCV: 92.9 fL (ref 78.0–100.0)
Platelets: 157 10*3/uL (ref 150–400)
RDW: 13.5 % (ref 11.5–15.5)
WBC: 7.3 10*3/uL (ref 4.0–10.5)

## 2011-12-30 LAB — RAPID URINE DRUG SCREEN, HOSP PERFORMED
Amphetamines: NOT DETECTED
Benzodiazepines: NOT DETECTED
Cocaine: NOT DETECTED
Opiates: NOT DETECTED
Tetrahydrocannabinol: NOT DETECTED

## 2011-12-30 MED ORDER — ACETAMINOPHEN 325 MG PO TABS: 650.0000 mg | ORAL_TABLET | ORAL | Status: DC | PRN

## 2011-12-30 MED ORDER — ONDANSETRON HCL 4 MG PO TABS: 4.0000 mg | ORAL_TABLET | Freq: Three times a day (TID) | ORAL | Status: DC | PRN

## 2011-12-30 MED ORDER — LORAZEPAM 1 MG PO TABS
1.0000 mg | ORAL_TABLET | Freq: Three times a day (TID) | ORAL | Status: DC | PRN
  Administered 2011-12-31: 1 mg via ORAL
  Filled 2011-12-30: qty 1

## 2011-12-30 MED ORDER — PALIPERIDONE ER 6 MG PO TB24
6.0000 mg | ORAL_TABLET | Freq: Every day | ORAL | Status: DC
  Administered 2011-12-30 – 2011-12-31 (×2): 6 mg via ORAL
  Filled 2011-12-30 (×3): qty 1

## 2011-12-30 NOTE — ED Notes (Signed)
Pt received from triage  Pt is alert and oriented x 3 Skin warm and dry  Pt states she is schizophrenic and has been having visual and audible hallucinations Pt states she has been having difficulty with her husband  States he does not understand how she can be a christian and have schizophrenia  Pt states she has known she had problems since middle school  Pt states she used to leave class everyday and go to the restroom or wander the grounds during class  Pt states even in college she would not stay in classes  Pt states she went to college for a year and a half but did not graduate  Pt states she got schizophrenia as a baby because her oxygen levels were low and half her body was purple and they used to have to smack her a lot to make her breathe to keep her oxygen levels up  Pt states recently she has been reminded of problems from her past and has been having a difficult time dealing with it

## 2011-12-30 NOTE — BH Assessment (Signed)
Assessment Note   Charlotte [name] is a 46 y.o. female who presents to the ED with AVHV. Patient was brought in by husband after patient reportedly locked her self in her bathroom for several hours last night. Patient states she had "things going through my head" explaining "it was like I was listening to a tape a player and following the instructions." Patient currently denies any AVHV with commands. Patient states she has a history of schizophrenia. She states she has been hearing several voices for the past 2 weeks. She states she hears her own voice, people talking, her husbands voice, and a man from her church who died last year. She states earlier today she heard a voice tell her to "have an agenda." Patient states she is very religous but is having doubts about her faith due to her illness. She reports this is very troubling for her. She states she feels "emotionally challenged." Patient reports 4 prior inpatient stays at [facility], [facility], and [facility]. Patient states she feels safe at home when her husband is home but is unsure about safety when he is at work.   Patient denies any SI, HI, and SA. Patient denies any abuse but states that she feels neglected by her family. She states that her husband is very supportive.   Patient's husband reports patient has an appointment with a psychiatrist, Dr. Steve Rattler January 30th. He states concerns that patient has difficulty taking medications and has side effects from medications. Patient reports tremors due to one of her medications, but is unsure which one. Patient was visibly shaking during assessment. Both patient and husband are seeking assistance with medication management.            Axis I: Psychotic Disorder NOS Axis II: Deferred Axis III:  Past Medical History  Diagnosis Date  . Headache   . Paranoid schizophrenia   . Depression   . Schizophrenia   . Low back pain    Axis IV: economic problems and other psychosocial or  environmental problems Axis V: 31-40 impairment in reality testing  Past Medical History:  Past Medical History  Diagnosis Date  . Headache   . Paranoid schizophrenia   . Depression   . Schizophrenia   . Low back pain     History reviewed. No pertinent past surgical history.  Family History:  Family History  Problem Relation Age of Onset  . Diabetes    . Hypertension    . Breast cancer      Social History:  reports that she has never smoked. She does not have any smokeless tobacco history on file. She reports that she drinks alcohol. She reports that she does not use illicit drugs.  Additional Social History:  Alcohol / Drug Use History of alcohol / drug use?: No history of alcohol / drug abuse Allergies:  Allergies  Allergen Reactions  . Depakote     Causing tremors  . Risperdal Itching    Causes altered mental status  . Sulfonamide Derivatives     REACTION: Eye irritation    Home Medications:  Medications Prior to Admission  Medication Dose Route Frequency Provider Last Rate Last Dose  . acetaminophen (TYLENOL) tablet 650 mg  650 mg Oral Q4H PRN Angus Seller, PA      . LORazepam (ATIVAN) tablet 1 mg  1 mg Oral Q8H PRN Angus Seller, PA      . ondansetron [facility]) tablet 4 mg  4 mg Oral Q8H PRN Phill Mutter  Dammen, PA      . paliperidone (INVEGA) 24 hr tablet 6 mg  6 mg Oral Daily Angus Seller, PA   6 mg at 12/30/11 1027   Medications Prior to Admission  Medication Sig Dispense Refill  . benztropine (COGENTIN) 1 MG tablet Take 1 mg by mouth 3 (three) times daily.        . fluPHENAZine (PROLIXIN) 5 MG tablet Take 2.5 mg by mouth 3 (three) times daily.        . paliperidone (INVEGA) 6 MG 24 hr tablet Take 9 mg by mouth every morning.       . Valproic Acid (STAVZOR) 500 MG CPDR Take by mouth. 2 at bedtime          OB/GYN Status:  No LMP recorded.  General Assessment Data Location of Assessment: [facility] ED Living Arrangements: Spouse/significant other Can pt return  to current living arrangement?: Yes Admission Status: Voluntary Is patient capable of signing voluntary admission?: Yes Transfer from: Home Referral Source: Self/Family/Friend     Risk to self Suicidal Ideation: No Suicidal Intent: No Is patient at risk for suicide?: No Suicidal Plan?: No Access to Means: No What has been your use of drugs/alcohol within the last 12 months?: none Previous Attempts/Gestures: No How many times?: 0  Other Self Harm Risks: none Triggers for Past Attempts: None known Intentional Self Injurious Behavior: None Family Suicide History: No Recent stressful life event(s): Conflict (Comment);Financial Problems Persecutory voices/beliefs?: Yes Depression: Yes Depression Symptoms: Fatigue;Isolating Substance abuse history and/or treatment for substance abuse?: No Suicide prevention information given to non-admitted patients: Not applicable  Risk to Others Homicidal Ideation: No Thoughts of Harm to Others: No Current Homicidal Intent: No Current Homicidal Plan: No Access to Homicidal Means: No Identified Victim: none History of harm to others?: No Assessment of Violence: None Noted Violent Behavior Description: none Does patient have access to weapons?: No Criminal Charges Pending?: No Does patient have a court date: No  Psychosis Hallucinations: Auditory;Visual;With command Delusions: None noted  Mental Status Report Appear/Hygiene: Disheveled Eye Contact: Fair Motor Activity: Tremors Speech: Slow;Tangential Level of Consciousness: Alert Mood: Depressed;Apprehensive Affect: Depressed;Apprehensive Anxiety Level: Minimal Thought Processes: Tangential Judgement: Impaired Orientation: Person;Place;Time Obsessive Compulsive Thoughts/Behaviors: None  Cognitive Functioning Concentration: Decreased Memory: Recent Intact;Remote Intact IQ: Average Insight: Poor Impulse Control: Fair Appetite: Good Weight Loss: 0  Weight Gain: 0  Sleep: No  Change (change in times sleeping, not in ammount) Total Hours of Sleep: 8  Vegetative Symptoms: None  Prior Inpatient Therapy Prior Inpatient Therapy: Yes Prior Therapy Dates: 1610,9604, unknown Prior Therapy Facilty/Provider(s): [facility], [facility], [facility] Reason for Treatment: schizophrenia   Prior Outpatient Therapy Prior Outpatient Therapy: Yes Prior Therapy Facilty/Provider(s): Dr. Steve Rattler Reason for Treatment: schizophrenia          Abuse/Neglect Assessment (Assessment to be complete while patient is alone) Physical Abuse: Denies Verbal Abuse: Denies Sexual Abuse: Denies Exploitation of patient/patient's resources: Denies Self-Neglect: Denies Values / Beliefs Cultural Requests During Hospitalization: None Spiritual Requests During Hospitalization: None        Additional Information 1:1 In Past 12 Months?: No CIRT Risk: No Elopement Risk: No Does patient have medical clearance?: Yes     Disposition:  Disposition Disposition of Patient: Other dispositions (Pending Telepsych)  On Site Evaluation by:   Reviewed with Physician:     Marjean Donna 12/30/2011 8:37 PM

## 2011-12-30 NOTE — ED Notes (Signed)
Pt given blue scrubs, and instructed to take all clothing items off and place in pt belonging bags.

## 2011-12-30 NOTE — ED Notes (Signed)
Asked patient if she would like a shower now. Patient still declining. Encouraged pt to let me know if she changed her mind.

## 2011-12-30 NOTE — ED Notes (Signed)
Pt remains on unit, denies needs, cont to monitor 

## 2011-12-30 NOTE — ED Provider Notes (Signed)
Medical screening examination/treatment/procedure(s) were performed by non-physician practitioner and as supervising physician I was immediately available for consultation/collaboration.   Vida Roller, MD 12/30/11 2204

## 2011-12-30 NOTE — ED Provider Notes (Signed)
History     CSN: [id number]  Arrival date & time 12/30/11  0150   First MD Initiated Contact with Patient 12/30/11 0315      Chief Complaint  Patient presents with  . Medical Clearance     HPI  History provided by the patient and significant other. Patient is a 46 year old female with history of paranoid schizophrenia who presents with complaints of worsening symptoms of paranoia and "losing touch with reality". Patient spouse reports that she has been behaving fairly normally all day yesterday until later on in the evening when she became very quiet and withdrawn. Patient states that she does not fill her medications are helping with her condition. She was recently seen last month and had increase her dosage of Invega. Patient's spouse states that he is concerned she has history in the past of wondering out of the house and into the streets or doing other erratic behaviors. Patient denies any intentional thoughts of SI/HI. She does state that she feels unsafe at times but gives no specific examples. Pt has upcoming appointment with her psychiatrist in March. Pt has no other complaints.    Past Medical History  Diagnosis Date  . Headache   . Paranoid schizophrenia   . Depression   . Schizophrenia   . Low back pain     History reviewed. No pertinent past surgical history.  Family History  Problem Relation Age of Onset  . Diabetes    . Hypertension    . Breast cancer      History  Substance Use Topics  . Smoking status: Never Smoker   . Smokeless tobacco: Not on file  . Alcohol Use: Yes    OB History    Grav Para Term Preterm Abortions TAB SAB Ect Mult Living                  Review of Systems  Constitutional: Negative for fever and chills.  Respiratory: Negative for cough and shortness of breath.   Gastrointestinal: Negative for nausea, vomiting, abdominal pain and diarrhea.  All other systems reviewed and are negative.    Allergies  Depakote; Risperdal; and  Sulfonamide derivatives  Home Medications   Current Outpatient Rx  Name Route Sig Dispense Refill  . BENZTROPINE MESYLATE 1 MG PO TABS Oral Take 1 mg by mouth 3 (three) times daily.      Marland Kitchen FLUPHENAZINE HCL 5 MG PO TABS Oral Take 2.5 mg by mouth 3 (three) times daily.      Marland Kitchen PALIPERIDONE ER 6 MG PO TB24 Oral Take 9 mg by mouth every morning.     Marland Kitchen VALPROIC ACID 500 MG PO CPDR Oral Take by mouth. 2 at bedtime        BP 138/90  Pulse 108  Temp(Src) 98.8 F (37.1 C) (Oral)  Resp 20  SpO2 100%  Physical Exam  Nursing note and vitals reviewed. Constitutional: She is oriented to person, place, and time. She appears well-developed and well-nourished. No distress.  HENT:  Head: Normocephalic.  Cardiovascular: Normal rate and regular rhythm.   Pulmonary/Chest: Effort normal and breath sounds normal.  Abdominal: Soft.  Neurological: She is alert and oriented to person, place, and time.  Skin: Skin is warm and dry. No rash noted.  Psychiatric: Her affect is blunt. She is withdrawn. She is not actively hallucinating. Thought content is paranoid. She expresses no homicidal and no suicidal ideation.    ED Course  Procedures (including critical care time)   Labs  Reviewed  CBC  COMPREHENSIVE METABOLIC PANEL  ETHANOL  URINE RAPID DRUG SCREEN (HOSP PERFORMED)  VALPROIC ACID LEVEL    Results for orders placed during the hospital encounter of 12/30/11  CBC      Component Value Range   WBC 7.3  4.0 - 10.5 (K/uL)   RBC 4.06  3.87 - 5.11 (MIL/uL)   Hemoglobin 12.4  12.0 - 15.0 (g/dL)   HCT 02.7  25.3 - 66.4 (%)   MCV 92.9  78.0 - 100.0 (fL)   MCH 30.5  26.0 - 34.0 (pg)   MCHC 32.9  30.0 - 36.0 (g/dL)   RDW 40.3  47.4 - 25.9 (%)   Platelets 157  150 - 400 (K/uL)  COMPREHENSIVE METABOLIC PANEL      Component Value Range   Sodium 141  135 - 145 (mEq/L)   Potassium 3.6  3.5 - 5.1 (mEq/L)   Chloride 103  96 - 112 (mEq/L)   CO2 26  19 - 32 (mEq/L)   Glucose, Bld 101 (*) 70 - 99  (mg/dL)   BUN 6  6 - 23 (mg/dL)   Creatinine, Ser 5.63  0.50 - 1.10 (mg/dL)   Calcium 9.3  8.4 - 87.5 (mg/dL)   Total Protein 7.2  6.0 - 8.3 (g/dL)   Albumin 3.9  3.5 - 5.2 (g/dL)   AST 15  0 - 37 (U/L)   ALT 13  0 - 35 (U/L)   Alkaline Phosphatase 64  39 - 117 (U/L)   Total Bilirubin 0.2 (*) 0.3 - 1.2 (mg/dL)   GFR calc non Af Amer 79 (*) >90 (mL/min)   GFR calc Af Amer >90  >90 (mL/min)  ETHANOL      Component Value Range   Alcohol, Ethyl (B) <11  0 - 11 (mg/dL)  URINE RAPID DRUG SCREEN (HOSP PERFORMED)      Component Value Range   Opiates NONE DETECTED  NONE DETECTED    Cocaine NONE DETECTED  NONE DETECTED    Benzodiazepines NONE DETECTED  NONE DETECTED    Amphetamines NONE DETECTED  NONE DETECTED    Tetrahydrocannabinol NONE DETECTED  NONE DETECTED    Barbiturates NONE DETECTED  NONE DETECTED       1. Schizophrenia       MDM  3:15AM patient seen and evaluated. Patient in no acute distress.   Patient is voluntary. Psych holding orders in place. [facility] act team to see and evaluate.     Angus Seller, [place] 12/30/11 [phone]

## 2011-12-30 NOTE — ED Notes (Signed)
Pt in c/o auditory hallucinations and paranoia, states she has a history of schizophrenia and depression, has been taking her medication as directed but noted increased symptoms over last few week, also noted to have tremors that started tonight, pt states she had a recent increase in her Depakote dose, pt denies SI/HI but states she has been struggling with these thoughts due to the voices she is hearing

## 2011-12-30 NOTE — ED Notes (Signed)
Therapeutic discussion with pt ie need to have good rest/activity balance. Pt states her husband's shift has changed and they are now staying up until 3 or 4 am but sleeping until afternoon which has affected her . Discussion with pt concerning exercise which would help her with depression, sleep, circulation etc. Pt states she could walk her dog.

## 2011-12-30 NOTE — ED Notes (Signed)
Psych ED notified of potential pt, awaiting return call for report

## 2011-12-30 NOTE — ED Notes (Signed)
Patient is resting comfortably. Given several women's magazines to read. Pt states she does not like to watch TV. Pt has been quite calm. Pt has been beside another pt who has been acting out loudly, so offered Ativan for anxiety. Mrs. Byington stated she was ok and did not need the medication.

## 2011-12-30 NOTE — ED Notes (Signed)
Husband leaves, pt to be moved to conference room

## 2011-12-30 NOTE — ED Notes (Signed)
Patient did not eat her lunch tray. Tray sat on bedside table too long. I disposed of tray and offered pt [place] sandwich and chips. Pt also requested cup of water. Provided for patient.

## 2011-12-30 NOTE — ED Notes (Signed)
ALP bedside to speak with pt 

## 2011-12-30 NOTE — ED Notes (Signed)
Pt given blue scrubs.  

## 2011-12-31 MED ORDER — PALIPERIDONE ER 6 MG PO TB24
9.0000 mg | ORAL_TABLET | ORAL | Status: DC
  Administered 2012-01-01: 9 mg via ORAL
  Filled 2011-12-31 (×4): qty 1

## 2011-12-31 MED ORDER — VALPROIC ACID 500 MG PO CPDR: 1000.0000 mg | DELAYED_RELEASE_CAPSULE | Freq: Every day | ORAL | Status: DC

## 2011-12-31 MED ORDER — BENZTROPINE MESYLATE 1 MG PO TABS
1.0000 mg | ORAL_TABLET | Freq: Three times a day (TID) | ORAL | Status: DC
  Administered 2011-12-31 – 2012-01-01 (×3): 1 mg via ORAL
  Filled 2011-12-31 (×3): qty 1

## 2011-12-31 MED ORDER — FLUPHENAZINE HCL 2.5 MG PO TABS
2.5000 mg | ORAL_TABLET | Freq: Three times a day (TID) | ORAL | Status: DC
  Administered 2011-12-31 – 2012-01-01 (×3): 2.5 mg via ORAL
  Filled 2011-12-31 (×8): qty 1

## 2011-12-31 NOTE — ED Notes (Signed)
Asked patient if she would like to shower today. Patient stated "no I don't really want to." Told patient that since she didn't take one yesterday that she would feel better if she took a shower. Patient decided she would shower. Provided with toiletries and clean scrubs. Cleaned room and changed linens while pt showering.

## 2011-12-31 NOTE — ED Notes (Signed)
Pt telepsych consult completed - recommended to have IP admission to psych ward - continue meds as being given.  Vida Roller, MD 12/31/11 [phone]

## 2011-12-31 NOTE — ED Notes (Signed)
Telepsych recommends inpatient treatment. Patients information sent to [facility].

## 2012-01-01 ENCOUNTER — Inpatient Hospital Stay (HOSPITAL_COMMUNITY)
Admission: AD | Admit: 2012-01-01 | Discharge: 2012-01-06 | DRG: 885 | Disposition: A | Discharge status: Home / Self Care | Payer: PRIVATE HEALTH INSURANCE | Source: Ambulatory Visit | Attending: Psychiatry | Admitting: Psychiatry

## 2012-01-01 ENCOUNTER — Encounter (HOSPITAL_COMMUNITY): Payer: Self-pay

## 2012-01-01 DIAGNOSIS — F3130 Bipolar disorder, current episode depressed, mild or moderate severity, unspecified: Secondary | ICD-10-CM

## 2012-01-01 DIAGNOSIS — F259 Schizoaffective disorder, unspecified: Principal | ICD-10-CM

## 2012-01-01 DIAGNOSIS — Z79899 Other long term (current) drug therapy: Secondary | ICD-10-CM

## 2012-01-01 DIAGNOSIS — F250 Schizoaffective disorder, bipolar type: Secondary | ICD-10-CM | POA: Diagnosis present

## 2012-01-01 MED ORDER — ALUM & MAG HYDROXIDE-SIMETH 200-200-20 MG/5ML PO SUSP: 30.0000 mL | ORAL | Status: DC | PRN

## 2012-01-01 MED ORDER — MAGNESIUM HYDROXIDE 400 MG/5ML PO SUSP: 30.0000 mL | Freq: Every day | ORAL | Status: DC | PRN

## 2012-01-01 MED ORDER — ACETAMINOPHEN 325 MG PO TABS: 650.0000 mg | ORAL_TABLET | Freq: Four times a day (QID) | ORAL | Status: DC | PRN

## 2012-01-01 NOTE — ED Notes (Signed)
AVW:[id number]> Expected date:<BR> Expected time:<BR> Means of arrival:<BR> Comments:<BR> For triage 4

## 2012-01-01 NOTE — Progress Notes (Signed)
Pt did not bring any belongings with her to the hospital.  Did make Pt aware that any items brought to the hospital and brought on the unit is her responsible.

## 2012-01-01 NOTE — Progress Notes (Signed)
Pt is admitted involuntarily to the service of Dr. Allena Katz. Pt is cooperative through out the admission. States that her "brain was misfiring and I was hearing voices". Pt states that she is not hearing voices now, but was glad that she was in the hospital. Reports that she has Schizophrenia as well as being Bipolar. Given support, and reassurance. Oriented to the unit. Encouraged to attend all the groups. Denies SI and HI.

## 2012-01-01 NOTE — ED Provider Notes (Addendum)
Pt alert, vitals normal, no acute distress. Assessment team has assessed pt - awaiting 400 hall bed.   Suzi Roots, MD 01/01/12 1028   Act team indicates pt has bed at [facility], bed ready, Dr readling accepting md.   Suzi Roots, MD 01/01/12 [phone]

## 2012-01-02 MED ORDER — FLUPHENAZINE HCL 5 MG PO TABS
2.5000 mg | ORAL_TABLET | Freq: Three times a day (TID) | ORAL | Status: DC
  Administered 2012-01-02 – 2012-01-04 (×7): 2.5 mg via ORAL
  Filled 2012-01-02 (×13): qty 1

## 2012-01-02 MED ORDER — VALPROIC ACID 500 MG PO CPDR
500.0000 mg | DELAYED_RELEASE_CAPSULE | ORAL | Status: DC
  Administered 2012-01-02 – 2012-01-06 (×9): 500 mg via ORAL

## 2012-01-02 MED ORDER — PALIPERIDONE ER 3 MG PO TB24
9.0000 mg | ORAL_TABLET | ORAL | Status: DC
  Administered 2012-01-02 – 2012-01-06 (×5): 9 mg via ORAL
  Filled 2012-01-02 (×7): qty 3

## 2012-01-02 MED ORDER — BENZTROPINE MESYLATE 1 MG PO TABS
1.0000 mg | ORAL_TABLET | Freq: Three times a day (TID) | ORAL | Status: DC
  Administered 2012-01-02 – 2012-01-04 (×7): 1 mg via ORAL
  Filled 2012-01-02 (×13): qty 1

## 2012-01-02 NOTE — Progress Notes (Signed)
Pt request her medications this morning saying she has to have them or she will have an episode   She does report hearing voices at times but not at the present time   Pt is cooperative and compliant with treatment and has attended group this morning   Verbal support given  Medications administered and effectiveness monitored   Q 15 min checks   Pt safe at present

## 2012-01-02 NOTE — Progress Notes (Addendum)
Suicide Risk Assessment  Admission Assessment     Demographic factors:   lives with her husband Current Mental Status:    Fully alert female completely oriented to person and situation, calm and cooperative. Her affect is mildly anxious, mood is mildly anxious. She's denying any suicidal thoughts, plan, or intent. She does not appear internally distracted. No delusional statements made, and she is given a coherent history today.  Loss Factors:   upset about finances Historical Factors:   hx of bipolar d/o Risk Reduction Factors:   living with husband, taking meds and out pt care  CLINICAL FACTORS:   Bipolar Disorder:   Depressive phase  COGNITIVE FEATURES THAT CONTRIBUTE TO RISK:  Thought constriction (tunnel vision)    SUICIDE RISK:   Mild:  Suicidal ideation of limited frequency, intensity, duration, and specificity.  There are no identifiable plans, no associated intent, mild dysphoria and related symptoms, good self-control (both objective and subjective assessment), few other risk factors, and identifiable protective factors, including available and accessible social support.  PLAN OF CARE:  1. admiting for safety  2. Adjusting meds a needed  Wonda Cerise 01/02/2012, 6:17 PM

## 2012-01-02 NOTE — Progress Notes (Signed)
[facility] Group Notes:  (Counselor/Nursing/MHT/Case Management/Adjunct)  01/02/2012 11 AM  Type of Therapy:  Group Therapy, Dance/Movement Therapy   Participation Level:  Minimal  Participation Quality:  Attentive  Affect:  Appropriate  Cognitive:  Appropriate  Insight:  Limited  Engagement in Group:  Limited  Engagement in Therapy:  Limited  Modes of Intervention:  Clarification, Problem-solving, Role-play, Socialization and Support  Summary of Progress/Problems: pt participated in a group discussion on feelings. Pt stated that they felt like the color "orange" for she can be bright and "wishy washy" at the same time. Pt then spoke about always wanting her room painted orange and believing that the color orange helps her to be calm.    Gevena Mart

## 2012-01-02 NOTE — Progress Notes (Signed)
Patient ID: Charlotte [name], female   DOB: 1966/12/20, 46 y.o.   MRN: [id number] The patient was somewhat apprehensive, but pleasant. She stated that sometimes she hears voices but denies any auditory hallucination at present. Social with roommate and interacting appropriately in the milieu. Denies any suicidal ideation.

## 2012-01-02 NOTE — H&P (Signed)
  Identifying information: This is a 46 year old Caucasian female, this is a voluntary admission.  History of present illness: Charlotte [name] presents after being brought to the emergency room by her husband. She says she thought her husband was speaking like the devil and locked herself in the bathroom. Her husband finally broke in and brought her to the emergency room, concerned about her agitation and delusional thinking. She reports that she's been under a lot of extra stress recently and has been having nightmares for the past 3 night screaming that she died. Most recently the couple has made a purchase of a new home a new car and most recently a new TV. Charlotte [name] believes that these purchases are the straw that broke the camel's back.  Today she reports she's been taking her medications regularly and is feeling anxious but denies any suicidal thoughts, intent, or plan for suicide. She rates her depression a 5/10, on 1-10 scale if 10 is the worst symptoms.  She reports sleep very poor for the past 1-2 weeks.   Past psychiatric history:  Charlotte [name] denies any substance abuse. [facility] and Dr. Archer Asa. She reports that she has been very stable for the past year on her current medication regimen. No hospitalizations in more than a year. She was diagnosed with schizophrenia 20 years ago and several years ago was also diagnosed with manic depression.  Social history:  Married Caucasian female, lives at home with her husband of many years, who is also bipolar and very supportive. No legal problems. She is on disability for schizophrenia. No children.  Medical evaluation:  Have medically and physically evaluated this patient today in my findings are consistent with those of the emergency room where her full medical clearance was performed. This is a normally developed Caucasian female with no abnormal movements, adequately hydrated,  adequately nourished. BUN 6 creatinine 0.87, her CBC was normal as was her metabolic panel. Urine drug screen and alcohol screens negative. VPA 98.5.  Admitting medications:  Prolixin 2.5 mg by mouth 3 times a day for clear thoughts. Invega 9 mg by mouth every morning for clear thoughts. Valproic acid CPDR (Stafzor)  500 mg 2 tabs at bedtime.  Benztropine 1 mg 3 times a day, to prevent Invega side effects.  Mental status exam:  Fully alert female completely oriented to person and situation, calm and cooperative. Her affect is mildly anxious, mood is mildly anxious. She's denying any suicidal thoughts, plan, or intent. She does not appear internally distracted. No delusional statements made, and she is given a coherent history today.  Admitting diagnosis: Schizoaffective disorder, bipolar type, depressed  Plan: We are going to continue her current medication regimen. She is apparently allergic to Depakote and we've asked her to bring him her  Stafzor valproic acid medication from home to take in its place. We'll continue her other routine medications and consider adding an antidepressant.

## 2012-01-02 NOTE — H&P (Signed)
Psychiatric Admission Assessment Adult  Patient Identification:  Charlotte [name] Date of Evaluation:  01/02/2012   I saw the pt with NP and agree with the her key elements stated in her H&P note dated for today.  Wonda Cerise 1/12/20136:20 PM

## 2012-01-03 DIAGNOSIS — F3130 Bipolar disorder, current episode depressed, mild or moderate severity, unspecified: Secondary | ICD-10-CM

## 2012-01-03 NOTE — Progress Notes (Signed)
[facility] Group Notes:  (Counselor/Nursing/MHT/Case Management/Adjunct)  01/03/2012 11 AM  Type of Therapy:  Group Therapy, Dance/Movement Therapy   Participation Level:  Did Not Attend   Charlotte [name]  

## 2012-01-03 NOTE — Progress Notes (Signed)
Pt is pleasant and cooperative  She is still a little confused about her medication dosages and wonders why the md has decreased some of her medications   She takes her medications without any problem and is compliant with treatment  Verbal support given  Medications administered and effectiveness monitored  Q 15 min checks  Pt safe at present

## 2012-01-03 NOTE — Progress Notes (Signed)
Patient ID: Charlotte [name], female   DOB: 1966-02-12, 46 y.o.   MRN: [id number] The patient is is pleasant and cooperative, but concerned that we are giving her too much medication. She spent most of the evening in her room and retired for bed early. Admits to hearing voices. Denies any suicidal ideation.
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
## 2012-01-03 NOTE — Progress Notes (Signed)
Pt's husband reported that the pt has an apt with Dr. Alcario Drought office with a Demetrios Isaacs 01/20/12 9:50 AM.

## 2012-01-03 NOTE — Progress Notes (Signed)
[facility] Adult Inpatient Family/Significant Other Suicide Prevention Education  Suicide Prevention Education:  Education Completed; Yumalay Circle (husband) [phone] Rexene Edison, [phone] C has been identified by the patient as the family member/significant other with whom the patient will be residing, and identified as the person(s) who will aid the patient in the event of a mental health crisis (suicidal ideations/suicide attempt).  With written consent from the patient, the family member/significant other has been provided the following suicide prevention education, prior to the and/or following the discharge of the patient.  The suicide prevention education provided includes the following:  Suicide risk factors  Suicide prevention and interventions  National Suicide Hotline telephone number  [facility] assessment telephone number  [facility] Emergency Assistance 911  [facility] and/or Residential Mobile Crisis Unit telephone number  Request made of family/significant other to:  Remove weapons (e.g., guns, rifles, knives), all items previously/currently identified as safety concern.    Remove drugs/medications (over-the-counter, prescriptions, illicit drugs), all items previously/currently identified as a safety concern.  The family member/significant other verbalizes understanding of the suicide prevention education information provided.  The family member/significant other agrees to remove the items of safety concern listed above.  Pt. accepted information on suicide prevention, warning signs to look for with suicide and crisis line numbers to use. The pt. agreed to call crisis line numbers if having warning signs or having thoughts of suicide.  Pt's husband reported that the pt was not talking to him about her mediaction not working until she was in crisis. He worried about leaving the pt at home when he works nights, he would like to take the pt to her mothers home during  this time but reports that the pt wont do this.    [facility] 01/03/2012, 4:24 PM

## 2012-01-03 NOTE — Progress Notes (Signed)
Patient ID: Charlotte [name], female   DOB: 18-Dec-1966, 46 y.o.   MRN: [id number]  "I'm tired today."   [name] husband has brought in her Stafzor valproic acid and she has been taking it as ordered.  Restarted on medications yesterday.  Had an episode of 'scary' thoughts very early this morning upon awakening, but none since.  She awakened and went to breakfast in the cafeteria.  She is pleased that her mother and sister came by yesterday to visit with her and her husband talked with her by phone.  She rates her depression a 7/10, and denies hopelessness and anxiety. Denies suicidal thoughts. Denies hallucinations.   Says she doesn't want to to talk in group about the financial stressors at home that have been worrying her - thinks she just made a big thing out of it when really 'it is just life.'  VPA level is therapeutic.   P:  Will contact husband for corroboration. Continue current meds.

## 2012-01-04 DIAGNOSIS — F250 Schizoaffective disorder, bipolar type: Secondary | ICD-10-CM | POA: Diagnosis present

## 2012-01-04 DIAGNOSIS — F259 Schizoaffective disorder, unspecified: Principal | ICD-10-CM

## 2012-01-04 LAB — COMPREHENSIVE METABOLIC PANEL WITH GFR
ALT: 17 U/L (ref 0–35)
AST: 22 U/L (ref 0–37)
Albumin: 3.7 g/dL (ref 3.5–5.2)
Alkaline Phosphatase: 51 U/L (ref 39–117)
BUN: 21 mg/dL (ref 6–23)
CO2: 26 meq/L (ref 19–32)
Calcium: 9.8 mg/dL (ref 8.4–10.5)
Chloride: 102 meq/L (ref 96–112)
Creatinine, Ser: 0.9 mg/dL (ref 0.50–1.10)
GFR calc Af Amer: 88 mL/min — ABNORMAL LOW
GFR calc non Af Amer: 76 mL/min — ABNORMAL LOW
Glucose, Bld: 111 mg/dL — ABNORMAL HIGH (ref 70–99)
Potassium: 3.9 meq/L (ref 3.5–5.1)
Sodium: 139 meq/L (ref 135–145)
Total Bilirubin: 0.2 mg/dL — ABNORMAL LOW (ref 0.3–1.2)
Total Protein: 7.2 g/dL (ref 6.0–8.3)

## 2012-01-04 MED ORDER — TRAZODONE HCL 100 MG PO TABS
100.0000 mg | ORAL_TABLET | Freq: Every day | ORAL | Status: DC
  Administered 2012-01-04 – 2012-01-05 (×2): 100 mg via ORAL
  Filled 2012-01-04 (×3): qty 1

## 2012-01-04 MED ORDER — FLUPHENAZINE HCL 2.5 MG PO TABS
2.5000 mg | ORAL_TABLET | ORAL | Status: DC
  Administered 2012-01-04 – 2012-01-06 (×5): 2.5 mg via ORAL
  Filled 2012-01-04 (×8): qty 1

## 2012-01-04 MED ORDER — MAGNESIUM HYDROXIDE 400 MG/5ML PO SUSP: 30.0000 mL | Freq: Every day | ORAL | Status: DC | PRN

## 2012-01-04 MED ORDER — BENZTROPINE MESYLATE 1 MG PO TABS
1.0000 mg | ORAL_TABLET | ORAL | Status: DC
  Administered 2012-01-04 – 2012-01-06 (×5): 1 mg via ORAL
  Filled 2012-01-04 (×6): qty 1

## 2012-01-04 NOTE — Discharge Planning (Signed)
Met with patient in Aftercare Planning Group.   States she is scheduled to see Bonita Quin at [facility] on 01/20/12.  Husband called Case Manager, and CM referred to counselor.  Utilization review done for additional days.  Ambrose Mantle, LCSW 01/04/2012, 1:58 PM

## 2012-01-04 NOTE — Progress Notes (Signed)
[facility] MD Progress Note  01/04/2012 3:22 PM  Diagnosis:  Axis I: Schizoaffective Disorder - Bipolar Type.   The patient was seen today and reports the following:   AL's: Intact  Sleep: The patient reports to having moderate difficulty maintaining sleep.  Appetite: The patient reports a good appetite.   Mild>(1-10) >Severe  Hopelessness (1-10): 2  Depression (1-10): 2  Anxiety (1-10): 3-4   Suicidal Ideation: The patient adamantly denies any suicidal ideations today.  Plan: No  Intent: No  Means: No   Homicidal Ideation: The patient adamantly denies any homicidal ideations today.  Plan: No  Intent: No.  Means: No   General Appearance /Behavior: The patient was appropriate and friendly but was mildly hypomanic.  Eye Contact: Good.  Speech: Appropriate in rate and volume with mild pressuring today.  Motor Behavior: wnl.  Level of Consciousness: Alert and Oriented x 3.  Mental Status: O x 3.  Mood: Mildly depressed.  Affect: Mildly constricted.  Anxiety Level: Mild to moderately anxiety.  Thought Process: WNL.  Thought Content: The patient denies any auditory or visual hallucinations for the last 24 hours.  She does report mild paranoid delusions related to her husband. Perception: nl.  Judgment: Fair to Good.  Insight: Fair to Good.  Cognition: Orientation time, place and person.  Sleep:  Number of Hours: 3.75   Vital Signs:Blood pressure 106/76, pulse 99, temperature 97.5 F (36.4 C), temperature source Oral, resp. rate 20, height 5' 4.75" (1.645 m), weight 91.173 kg (201 lb).  Lab Results: No results found for this or any previous visit (from the past 48 hour(s)).  Treatment Plan Summary:  1. Daily contact with patient to assess and evaluate symptoms and progress in treatment  2. Medication management  3. The patient will deny suicidal ideations or homicidal ideations for 48 hours prior to discharge and have a depression and anxiety rating of 3 or less. The patient  will also deny any auditory or visual hallucinations or delusional thinking.   Plan:  1. Will continue the patient on her current medications.  2. Will continue to monitor. 3. Will add Trazodone 100 mgs po qhs for sleep. 4. Discharge soon.  Jahzeel Poythress 01/04/2012, 3:22 PM

## 2012-01-04 NOTE — Progress Notes (Signed)
Recreation Therapy Notes  01/04/2012         Time: 0930      Group Topic/Focus: The focus of this group is on enhancing the patient's understanding of leisure, barriers to leisure, and the importance of engaging in positive leisure activities upon discharge for improved total health.  Participation Level: Active  Participation Quality: Appropriate and Attentive  Affect: Appropriate  Cognitive: Oriented   Additional Comments: None.   Anil Havard 01/04/2012 11:46 AM 

## 2012-01-04 NOTE — Progress Notes (Signed)
In hallway on approach. Appears flat and depressed. Calm and cooperative with assessment. A/Ox4. No acute distress noted. States she has had and up/down day. States the up part was going to the gym. The down part is her "problems with religion which is normal. I am trying to come up with all the answers, but I am not doing well at it.". Support and encouragement provided. Did question HS meds and requested to skip her trazodone because it is new, she doesn't want to be too drowsy in AM and she wants to discuss with MD. Discussed medication and indication. Pt more calm after discussion and agreed to take it tonight and discuss it with MD in AM. Otherwise, no additional questions or concerns. Denies SI/HI/AVH and contracts for safety. Denies pain or discomfort. POC and medications for the shift reviewed and understanding verbalized. Safety has been maintained with Q68minute observation. Will continue current POC.

## 2012-01-04 NOTE — Tx Team (Signed)
Interdisciplinary Treatment Plan Update (Adult)  Date:  01/04/2012  Time Reviewed:  11:20 AM   Progress in Treatment: Attending groups:  Yes Participating in groups:    Yes, fully engaged Taking medication as prescribed:    Yes Tolerating medication:   Yes Family/Significant other contact made:  Yes, needed to establish what baseline is Patient understands diagnosis:   Yes Discussing patient identified problems/goals with staff:   Yes Medical problems stabilized or resolved:   Yes Denies suicidal/homicidal ideation:  Yes Issues/concerns per patient self-inventory:   Concerned about staying on medications Other:  New problem(s) identified: No, Describe:    Reason for Continuation of Hospitalization: Delusions  Depression Hallucinations Medication stabilization  Interventions implemented related to continuation of hospitalization:  Medication monitoring and adjustment, safety checks Q15 min., group therapy, psychoeducation, collateral contact  Additional comments:  Not applicable  Estimated length of stay:  2 days  Discharge Plan:  Return home to live with husband, follow up with [facility]  New goal(s):  Not applicable  Review of initial/current patient goals per problem list:   1.  Goal(s):  Reduce manic symptoms to baseline.  Met:  Yes  Target date:  By Discharge   As evidenced by:  No evidence of mania  2.  Goal(s):  Reduce paranoid delusions & voices to baseline.  Met:  No  Target date:  By Discharge   As evidenced by:  Is getting better, voices gone and paranoid delusions "mild" (still some about husband)  3.  Goal(s):  Make commitment and plan to staying on medications.  Met:  No  Target date:  By Discharge   As evidenced by:  Working with Charity fundraiser on a plan, pill box, understanding of medication  4.  Goal(s):  Reduce depression from 10 to 3.  Met:  Yes  Target date:  By Discharge   As evidenced by:  Today is at a "2"  Attendees: Patient:   Charlotte [name]  01/04/2012 11:19 AM   Family:     Physician:  Dr. Harvie Heck Readling 01/04/2012 11:19 AM  Nursing:   Robbie Louis, RN 01/04/2012 11:19 AM     Case Manager:  Ambrose Mantle, LCSW 01/04/2012  11:19 AM   Counselor:  Veto Kemps, MT-BC 01/04/2012 11:20 AM    Other:   Lynann Bologna, NP 01/04/2012 11:19 AM   Other:      Other:      Other:       Scribe for Treatment Team:   Sarina Ser, 01/04/2012, 11:20 AM

## 2012-01-04 NOTE — Progress Notes (Signed)
Pt was lying in bed awake upon first assessment.  She answered her questions on her self-inventory and rated both her depression and hopelessness a 1 today and her anxiety 0.  She did answer that she might would have a problem staying on her medications at discharge.  Once we discussed why, she stated,"I really just need a list of times and dosages I get them confused sometimes"  We did talk about how she would get a list of her meds and times they are due upon discharge.  She did say she had a pill box at home and she was planning to use that and then right the times on the side.  Pt denies any A/V hallucinations and had lunch with her husband today and feels "comfortable" with him now.  She talked about how it has been 20 years since the last time she heard any voices.  She did discuss issues she had with her mother and her husband mentioned that too.  Pt's husband said that pt will call her mother 4-10 times a day sometimes and her mother is watching children and has to get up at 0400 and doesn't have time to talk with her all the time.  Discussed this with pt and she agreed that she did call her mother some days but if she doesn't call her for a couple of days, then her mother is calling her.  Her husband is also concerned that pt tries to keep up with his schedule instead of her own.  Most of the time, he works late in the evening and come home late.  He will typically sleep late in the morning and she needs to be up doing things for herself.  He wants her to get back involved in [facility] which she could be there during the day.  Pt has been talking about getting involved with them again but hasn't yet.  Pt did admit this morning that she didn't sleep well last night because she is used to staying up late and sleeping during the day.  She did agree that she needs to try and change her habits so she can do more for herself.  Dr. Allena Katz has ordered some labs to be drawn and added trazodone for sleep to  start tonight.  Pt voiced understanding.  Dr. Allena Katz told her in treatment team that she would be here a couple of days longer.

## 2012-01-04 NOTE — Progress Notes (Signed)
Patient ID: Charlotte [name], female   DOB: 28-Dec-1965, 46 y.o.   MRN: [id number] The patient has a blunted affect and neutral mood. She was pleasant but only minimal engagement in the milieu. Denies any A/V hallucinations.

## 2012-01-05 LAB — T3, FREE: T3, Free: 2.4 pg/mL (ref 2.3–4.2)

## 2012-01-05 LAB — TSH: TSH: 2.689 u[IU]/mL (ref 0.350–4.500)

## 2012-01-05 LAB — T4, FREE: Free T4: 0.88 ng/dL (ref 0.80–1.80)

## 2012-01-05 NOTE — Progress Notes (Signed)
Patient ID: Charlotte [name], female   DOB: [birth year], 46 y.o.   MRN: [id number] Slept very well all through the night last night after taking Trazodone 100mg  - she thinks this helped her a lot. At home she had gotten into a cycle of sleeping a lot during the day and then being up at night - and this had worried her husband.  She denies suicidal thoughts.  Rates depression a 1-2/10, and rates hopelessness a 0/10. Denies suicidal thoughts. Her sister came to visit last night.  She has spoken with her husband on the phone .  She thinks she might be ready to go home tomorrow.   O: Full affect, appropriate manner, dress, and hygiene.  Pleasant and cooperative. Denying any dangerous ideas.  Eye contact is solid.  She is pleasant.  Group participation is good. No delusional statements made.   P: Consider discharge in am, continue current plan.

## 2012-01-05 NOTE — Progress Notes (Signed)
[facility] Group Notes:  (Counselor/Nursing/MHT/Case Management/Adjunct)  01/05/2012 12:18 PM  Type of Therapy:  Group Therapy  Participation Level:  Active  Participation Quality:  Attentive and Sharing  Affect:  Blunted  Cognitive:  Oriented  Insight:  Limited  Engagement in Group:  Good  Engagement in Therapy:  Good  Modes of Intervention:  Clarification, Education and Support  Summary of Progress/Problems: Patient was quiet but was attentive. She gave support for her peer. Patient reported being drowsy today from her Trazadone. Slept well last night.    HartisAram [name] 01/05/2012, 12:18 PM

## 2012-01-05 NOTE — Progress Notes (Signed)
In hallway on approach. Appears flat and depressed, but does brighten at intervals. Calm and cooperative with assessment. A/Ox4. No acute distress noted. States she has had a good day. When asked to qualify good day, states she is glad to know she will be D/C'd in AM. States she feels like her mood is improving and is hopeful trazodone will continue to help her get more regular sleep. Support and encouragement provided. When asked what will be different after D/C, replied, "probably not much." with a half smile, but then returned to better sleep and med compliance as her plan. Otherwise, no additional questions or concerns. Denies SI/HI/AVH and contracts for safety. POC and medications for the shift reviewed and understanding verbalized. Safety has been maintained with Q25minute observation. Will f/u response to prn and continue current POC

## 2012-01-05 NOTE — Progress Notes (Signed)
[facility] Group Notes:  (Counselor/Nursing/MHT/Case Management/Adjunct)  01/05/2012 12:13 PM  Type of Therapy:  Group Therapy 01/04/2012  Participation Level:  Active  Participation Quality:  Attentive and Sharing  Affect:  Blunted  Cognitive:  Oriented  Insight:  Limited  Engagement in Group:  Good  Engagement in Therapy:  Good  Modes of Intervention:  Clarification, Education and Support  Summary of Progress/Problems: Patient was active in discussion about [facility] which is where her husband wants her to go during the day. She stated that she has a friend that goes there but she doesn't want to go because she doesn't want to make his girlfriend jealous. Group strongly encouraged her to go and take care of herself. Patient discussed her husband wanting these same friends to move in with them to keep her company, but she doesn't this to happen.   HartisAram Beecham 01/05/2012, 12:13 PM

## 2012-01-05 NOTE — Progress Notes (Signed)
Pt has been up for all groups today.  She rated her depression a 2 hopelessness a 1 and her anxiety a 2 today.  She denies any voices at all.  She has been tearful at times this afternoon talking about her husband and after she had time to talk, she would calm right down.  She talked about how she was glad she was taking the trazodone now to help her sleep.  She stated, "my husband worries about me because I stay up late at night and he thinks I should be asleep"  She talked about when she goes home she wants to plan to sleep more at night so she can do more activities in the daytime.  Possible discharge tomorrow if she continues to do well.  No new orders thus far.

## 2012-01-05 NOTE — Discharge Planning (Signed)
Met with patient in Aftercare Planning Group.   Patient believes that Husband will pick her up from hospital, but it may be mother.  She is unable to say how her Husband feels she is doing, although she says he has been talking to her on the phone.  Follow-up confirmed with [name], PA, at [facility] on 01/21/12 at 9:50AM.  Ambrose Mantle, LCSW 01/05/2012, 3:10 PM

## 2012-01-05 NOTE — Progress Notes (Signed)
Adult Services Patient-Family Contact/Session  Attendees:  Patient's husband, [name] [phone])  Goal(s):  Return call  Safety Concerns:  None reported  Narrative:  Husband called to get an update on patient. Reviewed with him from treatment team meeting with patient. Reported patient not currently hearing voices, depression and anxiety both decreased. Continues to have some difficulty with her thinking. Doctor plans to discharge her in a few days. Husband asked about medications and was referred to the nursing staff for questions. Counselor discussed further plans for when patient is discharged. He would like for her to go to [facility] but she is afraid if she goes there she will get something from the others (i.e. their illness). Told husband that this would be addressed because this would be a way to keep patient more active. He is at home during the day but he wants her to get on a schedule of her own, not his. He stated that she spends too much time contacting her mother, but her mother is trying to push her away. He has tried to get patient to stay with mother from 5-10pm when he works for [employer] but she refuses.  Barrier(s):  None   Interventions: information, discharge planning   Recommendation(s):  Outpatient follow up, attend [facility]  Follow-up Required:  No  Explanation:    Veto Kemps 01/05/2012, 8:35 AM

## 2012-01-06 MED ORDER — TRAZODONE HCL 100 MG PO TABS: 100.0000 mg | ORAL_TABLET | Freq: Every day | ORAL | Status: AC

## 2012-01-06 MED ORDER — VALPROIC ACID 500 MG PO CPDR: 1000.0000 mg | DELAYED_RELEASE_CAPSULE | Freq: Every day | ORAL | Status: DC

## 2012-01-06 MED ORDER — PALIPERIDONE ER 6 MG PO TB24: ORAL_TABLET | ORAL | Status: DC

## 2012-01-06 MED ORDER — VALPROIC ACID 500 MG PO CPDR: 500.0000 mg | DELAYED_RELEASE_CAPSULE | ORAL | Status: DC

## 2012-01-06 MED ORDER — BENZTROPINE MESYLATE 1 MG PO TABS: 1.0000 mg | ORAL_TABLET | Freq: Three times a day (TID) | ORAL | Status: DC

## 2012-01-06 MED ORDER — FLUPHENAZINE HCL 5 MG PO TABS: 2.5000 mg | ORAL_TABLET | Freq: Three times a day (TID) | ORAL | Status: DC

## 2012-01-06 NOTE — [facility] Suicide Risk Assessment (Signed)
Suicide Risk Assessment  Discharge Assessment     Demographic factors:    Current Mental Status:    Risk Reduction Factors:     CLINICAL FACTORS:   Previous Psychiatric Diagnoses and Treatments Schizoaffective Disorder - Bipolar Type.  COGNITIVE FEATURES THAT CONTRIBUTE TO RISK:    Diagnosis:  Axis I: Schizoaffective Disorder - Bipolar Type.   The patient was seen today and reports the following:   AL's: Intact  Sleep: The patient reports to sleeping well with the medication Trazodone.  Appetite: The patient reports a good appetite.   Mild>(1-10) >Severe  Hopelessness (1-10): 0  Depression (1-10): 0  Anxiety (1-10): 0   Suicidal Ideation: The patient adamantly denies any suicidal ideations today.  Plan: No  Intent: No  Means: No   Homicidal Ideation: The patient adamantly denies any homicidal ideations today.  Plan: No  Intent: No.  Means: No   General Appearance /Behavior: The patient was appropriate and friendly today.  Eye Contact: Good.  Speech: Appropriate in rate and volume with no pressuring noted.  Motor Behavior: wnl.  Level of Consciousness: Alert and Oriented x 3.  Mental Status: O x 3.  Mood: Euthymic.  Affect: Bright and Full.  Anxiety Level: None Reported or observed. Thought Process: WNL.  Thought Content: The patient denies any auditory or visual hallucinations today as well as any delusional thinking. Perception: wnl.  Judgment: Good.  Insight: Good.  Cognition: Orientated to time, place and person.   Time was spent today discussing with the patient her discharge today.  The patient states that she is feeling well and is appreciative of her care.  She plans to stay with her Mother for a few days while her husband is at work to make sure she adjusts well to being back home.  She will follow up with [facility].  Treatment Plan Summary:  1. Daily contact with patient to assess and evaluate symptoms and progress in treatment  2.  Medication management  3. The patient will deny suicidal ideations or homicidal ideations for 48 hours prior to discharge and have a depression and anxiety rating of 3 or less. The patient will also deny any auditory or visual hallucinations or delusional thinking.   Plan:  1. Will continue the patient on her current medications.  2. Will continue to monitor.  3. Discharge today to outpatient follow up.  SUICIDE RISK:   Minimal: No identifiable suicidal ideation.  Patients presenting with no risk factors but with morbid ruminations; may be classified as minimal risk based on the severity of the depressive symptoms  Charlotte [name] 01/06/2012, 11:31 AM

## 2012-01-06 NOTE — Tx Team (Signed)
Interdisciplinary Treatment Plan Update (Adult)  Date:  01/06/2012  Time Reviewed:  10:55 AM   Progress in Treatment: Attending groups:  Yes Participating in groups:  Yes, fully engaged   Taking medication as prescribed: Yes    Tolerating medication:   Yes, no side effects have been reported by patient or noted by staff Family/Significant other contact made:  Yes Patient understands diagnosis:   Yes Discussing patient identified problems/goals with staff:   Yes Medical problems stabilized or resolved:   Yes Denies suicidal/homicidal ideation:  Yes Issues/concerns per patient self-inventory:   None Other:  New problem(s) identified: No, Describe:    Reason for Continuation of Hospitalization: None  Interventions implemented related to continuation of hospitalization:  Medication monitoring and adjustment, safety checks Q15 min., group therapy, psychoeducation, collateral contact - until discharge  Additional comments:  Will stay with mother for a few days to feel comfortable, then will go home with husband when ready.  Is vacillating on whether she would be interested in going too Psychosocial Rehabilitation at [facility].  Agrees to consider and look into this, whether they take her insurance, etc.  Estimated length of stay:  Discharge today  Discharge Plan:  Go home with husband (stay with mother at first), follow up at [facility]  New goal(s):  Not applicable  Review of initial/current patient goals per problem list:   1.  Goal(s):  Reduce paranoid delusions & voices to baseline.  Met:  Yes  Target date:  By Discharge   As evidenced by:  Denies but then hesitates, then denies again  2.  Goal(s):  Make commitment and plan to staying on medications.  Met:  Yes  Target date:  By Discharge   As evidenced by:  Has a pillbox and will fill this on the same day weekly with help of husband, will put on dryer so she can see it throughout the day instead of it being  in the cabinet where cannot see it  3.  Goal(s):  Reduce depression from 10 to 3.  Met:  Yes  Target date:  By Discharge   As evidenced by:  Denies any depression today     Attendees: Patient:  Charlotte [name]  01/06/2012 10:55 AM   Family:     Physician:  Dr. Harvie Heck Readling 01/06/2012 10:55 AM   Nursing:   Robbie Louis, RN 01/06/2012   10:55 AM   Case Manager:  Ambrose Mantle, LCSW 01/06/2012  10:55 AM   Counselor:  Veto Kemps, MT-BC 01/06/2012  10:55 AM   Other:   Riverside Cellar, RN 01/06/2012 10:56 AM   Other:      Other:      Other:       Scribe for Treatment Team:   Sarina Ser, 01/06/2012, 10:55 AM

## 2012-01-06 NOTE — Progress Notes (Signed)
[facility] Case Management Discharge Plan:  Will you be returning to the same living situation after discharge: Yes,  with husband after short stay with mother At discharge, do you have transportation home?:Yes,  husband to transport Do you have the ability to pay for your medications:Yes,  insurance and income  Interagency Information:     Release of information consent forms completed and in the chart;  Patient's signature needed at discharge.  Patient to Follow up at:  Follow-up Information    Follow up with [name] at [facility] on 01/21/2012. (9:50AM appointment)    Contact information:   [address] [place]  [address] Telephone:  [phone]         Patient denies SI/HI:   Yes,      Safety Planning and Suicide Prevention discussed:  Yes,  During Aftercare Planning Group, Case Manager provided psychoeducation on "Suicide Prevention Information."  This included descriptions of risk factors for suicide, warning signs that an individual is in crisis and thinking of suicide, and what to do if this occurs.  Pt indicated understanding of information provided, and will read brochure given upon discharge.     Barrier to discharge identified:No.  Summary and Recommendations:  Consider PSR, follow up at [facility].   Sarina Ser 01/06/2012, 12:46 PM

## 2012-01-06 NOTE — Discharge Summary (Signed)
  H&P: This is a 46 year old married Caucasian female, this is a voluntary admission.  Date of admission: 01/01/2012 Date of discharge: 01/06/2012  Discharge diagnoses: Axis I: Schizoaffective disorder, bipolar type. Axis II: No diagnosis Axis III: No diagnosis Axis IV: Recent financial stressors, stabilizing. Axis V: Current 58 past year not known.  Note: This patient is being discharged on 2 antipsychotics after at least 2 failed trials of a single antipsychotic. This is the same medication regimen on which she was admitted. We recommend her outpatient psychiatrist consider reducing or discontinuing Prolixin.  Discharge medications: Trazodone 100 mg each bedtime, to promote sleep. Benztropine 1 mg 3 times daily, to prevent EPS. Fluphenazine 5 mg tablets, one half tab 3 times daily, for psychosis. Invega 9 mg every morning, for psychosis. Valproic acid 500 CPDR mg every morning and each bedtime, for mood stability. Note: She is allergic to Depakote.   Diagnostic studies: Valproic acid level 98.5 at the time of admission.  Course Of Hospitalization This was a brief admission for Charlotte [name] who presented after being brought to the emergency room by her husband. She thought her husband was speaking like the devil and locked herself in the bathroom. Her husband finally broke in and brought her to the emergency room, concerned about her agitation and delusional thinking. She reports that she's been under a lot of extra stress recently and has been having nightmares for the past 3 night screaming that she died. Most recently the couple has made a purchase of a new home a new car and most recently a new TV. Charlotte [name] believes that these purchases are the "straw that broke the camel's back."  Charlotte [name] was restarted on her routine medications, which he tolerated well. She revealed that she had gotten into a cycle at home where she slept most of the day and wandered around the house at night. We added  trazodone to facilitate her sleep, and she tolerated this well and has been sleeping through the night. Her group participation was satisfactory. She was calm and cooperative on the unit., And by the 16th her thinking was logical insight much improved she was ready for discharge.  She will followup with [name] at [facility] on January 31 at 9:50 AM.

## 2012-01-06 NOTE — Progress Notes (Signed)
Pt was discharged home today.  She denied any S/I H/I or A/V hallucinations.  She was given f/u appointment, rx, sample medications, hotline info booklet, and letter provided by the case manager.  She voiced understanding to all instructions provided.  She declined the need for smoking cessation materials.   

## 2012-01-06 NOTE — Progress Notes (Signed)
Recreation Therapy Notes  01/06/2012         Time: 0930      Group Topic/Focus: The focus of this group is on discussing various styles of communication and communicating assertively using 'I' (feeling) statements.  Participation Level: Minimal  Participation Quality: Appropriate and Attentive  Affect: Appropriate  Cognitive: Oriented   Additional Comments: Patient missed much of group as she was meeting with the doctor.  Migdalia Olejniczak 01/06/2012 11:51 AM

## 2012-01-07 NOTE — Progress Notes (Signed)
Patient Discharge Instructions:  Admission Note Faxed,  01/07/2012 After Visit Summary Faxed,  01/07/2012 Faxed to the Next Level Care provider:  01/07/2012 D/C Summary faxed 01/07/2012 Facesheet faxed 01/07/2012  Faxed to [facility] @ [phone], Eduard Clos, 01/07/2012, 5:57 PM

## 2012-05-26 ENCOUNTER — Encounter: Payer: Self-pay | Admitting: Internal Medicine

## 2012-09-02 ENCOUNTER — Encounter: Payer: Self-pay | Admitting: Internal Medicine

## 2012-10-06 ENCOUNTER — Encounter (HOSPITAL_COMMUNITY): Payer: Self-pay | Admitting: Family Medicine

## 2012-10-06 ENCOUNTER — Telehealth (HOSPITAL_COMMUNITY): Payer: Self-pay | Admitting: Licensed Clinical Social Worker

## 2012-10-06 ENCOUNTER — Emergency Department (HOSPITAL_COMMUNITY)
Admission: EM | Admit: 2012-10-06 | Discharge: 2012-10-08 | Disposition: A | Discharge status: Home / Self Care | Payer: Medicare Other | Attending: Emergency Medicine | Admitting: Emergency Medicine

## 2012-10-06 DIAGNOSIS — T50901A Poisoning by unspecified drugs, medicaments and biological substances, accidental (unintentional), initial encounter: Secondary | ICD-10-CM

## 2012-10-06 DIAGNOSIS — T443X1A Poisoning by other parasympatholytics [anticholinergics and antimuscarinics] and spasmolytics, accidental (unintentional), initial encounter: Secondary | ICD-10-CM | POA: Insufficient documentation

## 2012-10-06 DIAGNOSIS — T433X1A Poisoning by phenothiazine antipsychotics and neuroleptics, accidental (unintentional), initial encounter: Secondary | ICD-10-CM | POA: Insufficient documentation

## 2012-10-06 DIAGNOSIS — F250 Schizoaffective disorder, bipolar type: Secondary | ICD-10-CM

## 2012-10-06 DIAGNOSIS — F205 Residual schizophrenia: Secondary | ICD-10-CM

## 2012-10-06 DIAGNOSIS — T43591A Poisoning by other antipsychotics and neuroleptics, accidental (unintentional), initial encounter: Secondary | ICD-10-CM | POA: Insufficient documentation

## 2012-10-06 DIAGNOSIS — F259 Schizoaffective disorder, unspecified: Secondary | ICD-10-CM | POA: Insufficient documentation

## 2012-10-06 LAB — COMPREHENSIVE METABOLIC PANEL
BUN: 10 mg/dL (ref 6–23)
CO2: 25 mEq/L (ref 19–32)
Calcium: 9.2 mg/dL (ref 8.4–10.5)
Creatinine, Ser: 0.9 mg/dL (ref 0.50–1.10)
GFR calc Af Amer: 88 mL/min — ABNORMAL LOW (ref >90)
GFR calc non Af Amer: 76 mL/min — ABNORMAL LOW (ref >90)
Glucose, Bld: 92 mg/dL (ref 70–99)
Total Bilirubin: 0.4 mg/dL (ref 0.3–1.2)

## 2012-10-06 LAB — VALPROIC ACID LEVEL: Valproic Acid Lvl: <10 ug/mL — ABNORMAL LOW (ref 50.0–100.0)

## 2012-10-06 LAB — POCT PREGNANCY, URINE: Preg Test, Ur: NEGATIVE

## 2012-10-06 LAB — RAPID URINE DRUG SCREEN, HOSP PERFORMED
Amphetamines: NOT DETECTED
Barbiturates: NOT DETECTED

## 2012-10-06 LAB — CBC
HCT: 36.6 % (ref 36.0–46.0)
Hemoglobin: 12.6 g/dL (ref 12.0–15.0)
MCH: 29.2 pg (ref 26.0–34.0)
MCV: 84.7 fL (ref 78.0–100.0)
RBC: 4.32 MIL/uL (ref 3.87–5.11)

## 2012-10-06 LAB — ACETAMINOPHEN LEVEL: Acetaminophen (Tylenol), Serum: <15 ug/mL (ref 10–30)

## 2012-10-06 LAB — SALICYLATE LEVEL: Salicylate Lvl: <2 mg/dL — ABNORMAL LOW (ref 2.8–20.0)

## 2012-10-06 LAB — GLUCOSE, CAPILLARY

## 2012-10-06 MED ORDER — FLUPHENAZINE HCL 2.5 MG PO TABS
2.5000 mg | ORAL_TABLET | Freq: Three times a day (TID) | ORAL | Status: DC
  Administered 2012-10-06 – 2012-10-08 (×6): 2.5 mg via ORAL
  Filled 2012-10-06 (×8): qty 1

## 2012-10-06 MED ORDER — BENZTROPINE MESYLATE 1 MG PO TABS
2.0000 mg | ORAL_TABLET | Freq: Every day | ORAL | Status: DC
  Administered 2012-10-06 – 2012-10-08 (×3): 2 mg via ORAL
  Filled 2012-10-06 (×3): qty 2

## 2012-10-06 MED ORDER — FLUPHENAZINE HCL 5 MG PO TABS: 5.0000 mg | ORAL_TABLET | Freq: Two times a day (BID) | ORAL | Status: DC

## 2012-10-06 MED ORDER — POTASSIUM CHLORIDE CRYS ER 20 MEQ PO TBCR
20.0000 meq | EXTENDED_RELEASE_TABLET | Freq: Once | ORAL | Status: AC
  Administered 2012-10-06: 20 meq via ORAL
  Filled 2012-10-06: qty 1

## 2012-10-06 MED ORDER — BENZTROPINE MESYLATE 1 MG/ML IJ SOLN: 2.0000 mg | Freq: Every day | INTRAMUSCULAR | Status: DC

## 2012-10-06 NOTE — ED Notes (Signed)
Requested urine sample from pt x2. Pt sts she cannot give sample at this time.

## 2012-10-06 NOTE — ED Notes (Signed)
Pt's husband reports pt was hospitalized at [facility] this past January and sts none of her medications were changed and that they "have been dealing with this since then." Sts pt wanders from home. Sts he wants to make sure that pt's medications are adjusted.

## 2012-10-06 NOTE — ED Notes (Signed)
Pt reports "I took too many pills because I thought it was what the Shaune Pollack wanted me to do".  Pt reports taking medications at 0700.   Presents with slurred speech, drowsiness. Per EMS Vitals: BP: 122/82, 90bpm, RR18, 99%RA

## 2012-10-06 NOTE — ED Notes (Signed)
1 Belonging's bag at nurse's station in front of room 16.  Bag contains 1 pair pajama pants, gray shirt, tan shirt, underwear, and 1 yellow metal ring.

## 2012-10-06 NOTE — BH Assessment (Signed)
Assessment Note   Charlotte [name] is an 46 y.o. female. Patient presented to [facility]. Patient was brought to [facility] via EMS after taking am overdose on her antipsychotic medication. Reportedly patient took fluphenazine 5 mg tablets x 5, and benztropine one milligrams tablets x 5, stating she had the freedom to take what she wanted. Patient later stated that  that God wanted her to to take her medication.  Patient was found in distress, in a fetal position, drowsy, with slurred speech. Pt's spouse reported he freaked out when he saw her in fetal position in a chair at home and not responding verbally. Patients husband called her mother, who asked him to wake her up and then contacted the emergency medical services During the assessment patient was  guarded, paranoid, delusional, and hypomanic. She denies SI, HI, and AVH's.  Patient has no history of for drug of abuse or alcoholism. She does admit to a prior diagnosis of schizophrenia. Reportedly during her previous psychiatric admission January 2013. Patient thought her husband was speaking like the devil and than locked herself in the bathroom. atient reported she has been suffering with with mental illness since he was 46 years old. Patient has been following up with outpatient psychiatry at [facility], saw the providers two days ago without changes in her medication.   Patient evaluated by Dr. Oneta Rack and he noted that patient overdosed on her medications in response to command hallucinations/delusions. Patient has no history of for drug of abuse and alcoholism.    Axis I: Schizoaffective Disorder NOS Axis II: Deferred Axis III:  Past Medical History  Diagnosis Date  . Headache   . Paranoid schizophrenia   . Depression   . Schizophrenia   . Low back pain    Axis IV: problems related to social environment Axis V: 31-40 impairment in reality testing  Past Medical History:  Past Medical History  Diagnosis Date  .  Headache   . Paranoid schizophrenia   . Depression   . Schizophrenia   . Low back pain     History reviewed. No pertinent past surgical history.  Family History:  Family History  Problem Relation Age of Onset  . Diabetes    . Hypertension    . Breast cancer      Social History:  reports that she has never smoked. She does not have any smokeless tobacco history on file. She reports that she drinks alcohol. She reports that she does not use illicit drugs.  Additional Social History:  Alcohol / Drug Use Pain Medications: SEE MAR Prescriptions: SEE MAR Over the Counter: SEE MAR History of alcohol / drug use?: No history of alcohol / drug abuse Longest period of sobriety (when/how long): n/a  CIWA: CIWA-Ar BP: 130/89 mmHg Pulse Rate: 93  COWS:    Allergies:  Allergies  Allergen Reactions  . Divalproex Sodium     Causing tremors  . Risperidone Itching    Causes altered mental status  . Sulfonamide Derivatives     REACTION: Eye irritation    Home Medications:  (Not in a hospital admission)  OB/GYN Status:  Patient's last menstrual period was 09/29/2012.  General Assessment Data Location of Assessment: [facility] Living Arrangements: Spouse/significant other Can pt return to current living arrangement?: Yes Admission Status: Voluntary Is patient capable of signing voluntary admission?: Yes Transfer from: Acute Hospital Referral Source: Self/Family/Friend  Education Status Is patient currently in school?: No  Risk to self Suicidal Ideation: No Suicidal Intent:  No Is patient at risk for suicide?: No Suicidal Plan?: No Access to Means: No What has been your use of drugs/alcohol within the last 12 months?:  (pt denies) Previous Attempts/Gestures: Yes How many times?:  (1x in 1997) Other Self Harm Risks:  (none reported) Triggers for Past Attempts: Other (Comment) Intentional Self Injurious Behavior: None Family Suicide History: Yes (pt refused to provide  details) Recent stressful life event(s): Other (Comment) (pt denies) Persecutory voices/beliefs?: No Depression: No Substance abuse history and/or treatment for substance abuse?: No Suicide prevention information given to non-admitted patients: Not applicable  Risk to Others Homicidal Ideation: No Thoughts of Harm to Others: No Current Homicidal Intent: No Current Homicidal Plan: No Access to Homicidal Means: No Identified Victim:  (n/a) History of harm to others?: No Assessment of Violence: None Noted Violent Behavior Description:  (pt calm and cooperative) Does patient have access to weapons?: No Criminal Charges Pending?: No Does patient have a court date: No  Psychosis Hallucinations: None noted Delusions: None noted  Mental Status Report Appear/Hygiene: Disheveled Eye Contact: Poor Motor Activity: Freedom of movement Speech: Logical/coherent Level of Consciousness: Alert Mood: Preoccupied Affect: Apprehensive Anxiety Level: None Thought Processes: Circumstantial Judgement: Impaired Orientation: Person;Place;Time;Situation Obsessive Compulsive Thoughts/Behaviors: None  Cognitive Functioning Concentration: Decreased Memory: Remote Intact;Recent Intact IQ: Average Insight: Poor Impulse Control: Poor Appetite: Fair Weight Loss:  (none reported) Weight Gain:  (none reported) Sleep: Decreased Total Hours of Sleep:  (3-4 hrs) Vegetative Symptoms: None  ADLScreening [facility] Assessment Services) Patient's cognitive ability adequate to safely complete daily activities?: Yes Patient able to express need for assistance with ADLs?: Yes Independently performs ADLs?: Yes (appropriate for developmental age)  Abuse/Neglect [facility]) Physical Abuse: Denies Verbal Abuse: Denies Sexual Abuse: Denies  Prior Inpatient Therapy Prior Inpatient Therapy: Yes Prior Therapy Dates:  (last admission was 1 yr ago; pt unable to recall others) Prior Therapy Facilty/Provider(s):  ([facility]  yr ago and [facility]) Reason for Treatment:  (schizophrenia/psychotic symptoms)  Prior Outpatient Therapy Prior Outpatient Therapy: Yes ([facility]) Prior Therapy Dates:  (current) Prior Therapy Facilty/Provider(s):  ([facility], English as a foreign language) Reason for Treatment:  (medication management)  ADL Screening (condition at time of admission) Patient's cognitive ability adequate to safely complete daily activities?: Yes Patient able to express need for assistance with ADLs?: Yes Independently performs ADLs?: Yes (appropriate for developmental age) Weakness of Legs: None Weakness of Arms/Hands: None  Home Assistive Devices/Equipment Home Assistive Devices/Equipment: None    Abuse/Neglect Assessment (Assessment to be complete while patient is alone) Physical Abuse: Denies Verbal Abuse: Denies Sexual Abuse: Denies Exploitation of patient/patient's resources: Denies Values / Beliefs Cultural Requests During Hospitalization: None Spiritual Requests During Hospitalization: None   Advance Directives (For Healthcare) Advance Directive: Patient does not have advance directive Nutrition Screen- [facility] Adult/[facility] Patient's home diet: Regular  Additional Information 1:1 In Past 12 Months?: No CIRT Risk: No Elopement Risk: No Does patient have medical clearance?: No     Disposition:   Pt referred to [facility] and pending review.   On Site Evaluation by:   Reviewed with Physician:     Melynda Ripple [facility] 10/06/2012 7:12 PM

## 2012-10-06 NOTE — ED Notes (Signed)
Poison control called to review pt status. Case closed per poison control.

## 2012-10-06 NOTE — ED Notes (Signed)
Pt's husband [name] requests to be made aware of any major changes.  Cell Phone: [phone]

## 2012-10-06 NOTE — ED Notes (Signed)
[facility] 91 

## 2012-10-06 NOTE — ED Provider Notes (Signed)
History     CSN: [id number]  Arrival date & time 10/06/12  4098   First MD Initiated Contact with Patient 10/06/12 [phone]      Chief Complaint  Patient presents with  . Drug Overdose    (Consider location/radiation/quality/duration/timing/severity/associated sxs/prior treatment) Patient is a 46 y.o. female presenting with Overdose. The history is provided by the patient.  Drug Overdose Pertinent negatives include no chest pain, no abdominal pain, no headaches and no shortness of breath.  patient states that approximately 7 am today, she 'misunderstood what the Shaune Pollack was telling her' and took approximately 5 extra of her prolixin and cogentin. Denies other ingestion. Denies suicidal thoughts. Denies depression. Hx schizophrenia. Denies hearing voices currently. States recent health at baseline. w symptoms this am, denies any specific exacerbating or alleviating factors.     Past Medical History  Diagnosis Date  . Headache   . Paranoid schizophrenia   . Depression   . Schizophrenia   . Low back pain     History reviewed. No pertinent past surgical history.  Family History  Problem Relation Age of Onset  . Diabetes    . Hypertension    . Breast cancer      History  Substance Use Topics  . Smoking status: Never Smoker   . Smokeless tobacco: Not on file  . Alcohol Use: Yes    OB History    Grav Para Term Preterm Abortions TAB SAB Ect Mult Living                  Review of Systems  Constitutional: Negative for fever and chills.  HENT: Negative for neck pain.   Eyes: Negative for redness.  Respiratory: Negative for shortness of breath.   Cardiovascular: Negative for chest pain.  Gastrointestinal: Negative for abdominal pain.  Genitourinary: Negative for flank pain.  Musculoskeletal: Negative for back pain.  Skin: Negative for rash.  Neurological: Negative for headaches.  Hematological: Does not bruise/bleed easily.  Psychiatric/Behavioral: Negative for  agitation. The patient is not nervous/anxious.     Allergies  Divalproex sodium; Risperidone; and Sulfonamide derivatives  Home Medications   Current Outpatient Rx  Name Route Sig Dispense Refill  . BENZTROPINE MESYLATE 1 MG PO TABS Oral Take 1 tablet (1 mg total) by mouth 3 (three) times daily. To Prevent EPS.    Marland Kitchen FLUPHENAZINE HCL 5 MG PO TABS Oral Take 0.5 tablets (2.5 mg total) by mouth 3 (three) times daily. For Psychosis    . PALIPERIDONE ER 6 MG PO TB24  Take 9mg  by mouth every morning, for psychosis.    Marland Kitchen VALPROIC ACID 500 MG PO CPDR Oral Take 1 capsule (500 mg total) by mouth 2 (two) times daily in the am and at bedtime.. For mood stability.      BP 120/89  Pulse 97  Temp 97.7 F (36.5 C) (Oral)  Resp 19  SpO2 100%  LMP 09/29/2012  Physical Exam  Nursing note and vitals reviewed. Constitutional: She is oriented to person, place, and time. She appears well-developed and well-nourished. No distress.  HENT:  Head: Atraumatic.  Mouth/Throat: Oropharynx is clear and moist.  Eyes: Conjunctivae normal are normal. Pupils are equal, round, and reactive to light. No scleral icterus.  Neck: Neck supple. No tracheal deviation present.  Cardiovascular: Normal rate, regular rhythm, normal heart sounds and intact distal pulses.   Pulmonary/Chest: Effort normal and breath sounds normal. No respiratory distress.  Abdominal: Soft. Normal appearance and bowel sounds are normal. She exhibits  no distension.  Musculoskeletal: She exhibits no edema and no tenderness.  Neurological: She is alert and oriented to person, place, and time.  Skin: Skin is warm and dry. No rash noted.  Psychiatric: She has a normal mood and affect.       Denies si.     ED Course  Procedures (including critical care time)   Labs Reviewed  CBC  COMPREHENSIVE METABOLIC PANEL  ETHANOL  ACETAMINOPHEN LEVEL  SALICYLATE LEVEL  URINE RAPID DRUG SCREEN (HOSP PERFORMED)  VALPROIC ACID LEVEL      MDM    Monitor, pulse ox.  Reviewed nursing notes and prior charts for additional history.    Date: 10/06/2012  Rate: 87  Rhythm: normal sinus rhythm  QRS Axis: normal  Intervals: normal  ST/T Wave abnormalities: normal  Conduction Disutrbances:none  Narrative Interpretation:   Old EKG Reviewed: unchanged          Suzi Roots, MD 10/09/12 2345

## 2012-10-06 NOTE — Consult Note (Signed)
Reason for Consult: Delusional, paranoid, overdose on medications Referring Physician: Dr. Roney Jaffe is an 46 y.o. female.  HPI: Patient was seen and chart reviewed. Patient was brought in by emergency medical services for taking overdose on her antipsychotic medication and saying that God wanted her to to take her medication. Reportedly patient taken fluphenazine 5 mg tablets x 5, and benztropine one milligrams tablets x 5, as a response to a psychotic delusions. Patient was found in distress and fetal position, drowsiness and slurred speech. Patient asked [place] to pray with her during assessment and also stated she took pills because she has the "freedom of choice". Patient has been guarded, paranoid, delusional, and hypomanic. Patient was found in her room dancing on the floor when no one watching her. Patient husband was concerned about financial reasons not to be admitted to the inpatient hospital. Patient husband reported he freaked out when he saw her in fetal position in a chair at home and not responding verbally. Patient husband called her mother, who asked him to wake her up and then contacted the emergency medical services. Reportedly during her previous psychiatric admission January 2013, Patient thought her husband was speaking like the devil and than locked herself in the bathroom. Her husband finally was broke in and brought her to the emergency department. Patient reported she has been suffering with with mental illness since he was 46 years old. She's been married for 12 years. Patient husband reported he has disability and works part-time only. Patient has been following up with outpatient psychiatry at [facility], saw the providers two days ago without changes in her medication. Patient denied symptoms of depression, and anxiety. Patient denied current suicidal or homicidal ideation, intentions, or plans. Patient overdose on her medications in response  to command hallucinations/delusions. Patient has no menstrual cycles in 10-12 weeks and she thought she was pregnant, but it was determined, She has no pregnancy, as per pregnancy test completed in primary care office. Patient has no history of for drug of abuse and alcoholism.  Past Medical History  Diagnosis Date  . Headache   . Paranoid schizophrenia   . Depression   . Schizophrenia   . Low back pain     History reviewed. No pertinent past surgical history.  Family History  Problem Relation Age of Onset  . Diabetes    . Hypertension    . Breast cancer      Social History:  reports that she has never smoked. She does not have any smokeless tobacco history on file. She reports that she drinks alcohol. She reports that she does not use illicit drugs.  Allergies:  Allergies  Allergen Reactions  . Divalproex Sodium     Causing tremors  . Risperidone Itching    Causes altered mental status  . Sulfonamide Derivatives     REACTION: Eye irritation    Medications: I have reviewed the patient's current medications.  Results for orders placed during the hospital encounter of 10/06/12 (from the past 48 hour(s))  CBC     Status: Normal   Collection Time   10/06/12 10:05 AM      Component Value Range Comment   WBC 10.0  4.0 - 10.5 K/uL    RBC 4.32  3.87 - 5.11 MIL/uL    Hemoglobin 12.6  12.0 - 15.0 g/dL    HCT 09.8  11.9 - 14.7 %    MCV 84.7  78.0 - 100.0 fL  MCH 29.2  26.0 - 34.0 pg    MCHC 34.4  30.0 - 36.0 g/dL    RDW 95.6  21.3 - 08.6 %    Platelets 234  150 - 400 K/uL   COMPREHENSIVE METABOLIC PANEL     Status: Abnormal   Collection Time   10/06/12 10:05 AM      Component Value Range Comment   Sodium 136  135 - 145 mEq/L    Potassium 3.2 (*) 3.5 - 5.1 mEq/L    Chloride 100  96 - 112 mEq/L    CO2 25  19 - 32 mEq/L    Glucose, Bld 92  70 - 99 mg/dL    BUN 10  6 - 23 mg/dL    Creatinine, Ser 5.78  0.50 - 1.10 mg/dL    Calcium 9.2  8.4 - 46.9 mg/dL    Total Protein  6.8  6.0 - 8.3 g/dL    Albumin 4.0  3.5 - 5.2 g/dL    AST 20  0 - 37 U/L    ALT 16  0 - 35 U/L    Alkaline Phosphatase 61  39 - 117 U/L    Total Bilirubin 0.4  0.3 - 1.2 mg/dL    GFR calc non Af Amer 76 (*) >90 mL/min    GFR calc Af Amer 88 (*) >90 mL/min   ETHANOL     Status: Normal   Collection Time   10/06/12 10:05 AM      Component Value Range Comment   Alcohol, Ethyl (B) <11  0 - 11 mg/dL   ACETAMINOPHEN LEVEL     Status: Normal   Collection Time   10/06/12 10:05 AM      Component Value Range Comment   Acetaminophen (Tylenol), Serum <15.0  10 - 30 ug/mL   SALICYLATE LEVEL     Status: Abnormal   Collection Time   10/06/12 10:05 AM      Component Value Range Comment   Salicylate Lvl <2.0 (*) 2.8 - 20.0 mg/dL   VALPROIC ACID LEVEL     Status: Abnormal   Collection Time   10/06/12 10:05 AM      Component Value Range Comment   Valproic Acid Lvl <10.0 (*) 50.0 - 100.0 ug/mL   URINE RAPID DRUG SCREEN (HOSP PERFORMED)     Status: Normal   Collection Time   10/06/12 10:54 AM      Component Value Range Comment   Opiates NONE DETECTED  NONE DETECTED    Cocaine NONE DETECTED  NONE DETECTED    Benzodiazepines NONE DETECTED  NONE DETECTED    Amphetamines NONE DETECTED  NONE DETECTED    Tetrahydrocannabinol NONE DETECTED  NONE DETECTED    Barbiturates NONE DETECTED  NONE DETECTED   POCT PREGNANCY, URINE     Status: Normal   Collection Time   10/06/12 10:58 AM      Component Value Range Comment   Preg Test, Ur NEGATIVE  NEGATIVE   GLUCOSE, CAPILLARY     Status: Normal   Collection Time   10/06/12 11:01 AM      Component Value Range Comment   Glucose-Capillary 91  70 - 99 mg/dL    Comment 1 Notify RN       No results found.  No depression, No anxiety and Positive for delusional, hallucination and overdose on antipsychotics. Blood pressure 117/80, pulse 81, temperature 97.7 F (36.5 C), temperature source Oral, resp. rate 16, last menstrual period 09/29/2012, SpO2  100.00%.   Assessment/Plan: Schizophrenia, chronic, undifferentiated  Recommended acute psychiatric hospitalization for safety and stabilization. Patient will continue her current home medication fluphenazine 5 mg twice daily, and Cogentin 2 mg at bedtime.  Katianna Mcclenney,[name] 10/06/2012, 5:35 PM

## 2012-10-06 NOTE — ED Notes (Signed)
JYN:[id number]<AO> Expected date:<BR> Expected time:<BR> Means of arrival:<BR> Comments:<BR> EMS: overdose

## 2012-10-07 NOTE — ED Notes (Signed)
After Terri, CN spoke with patient her mood appeared changed to happiness.

## 2012-10-07 NOTE — ED Notes (Signed)
Patient came to desk and stated that she wanted to see someone in charge because when she get"s discharged she wanted to take her bed, mattress, and everything in her room including TV. Patient informed that she could not and patient then replied " what's going to keep from taking it" Tresa Endo, RN replied jail. Patient was visibly upset when she returned to her room. Charge Nurse Terri contacted to come and speak with patient.

## 2012-10-08 MED ORDER — DIPHENHYDRAMINE HCL 25 MG PO CAPS
50.0000 mg | ORAL_CAPSULE | Freq: Four times a day (QID) | ORAL | Status: DC | PRN
  Administered 2012-10-08: 50 mg via ORAL
  Filled 2012-10-08: qty 2

## 2012-10-08 MED ORDER — BENZTROPINE MESYLATE 1 MG PO TABS: 1.0000 mg | ORAL_TABLET | Freq: Two times a day (BID) | ORAL | Status: DC

## 2012-10-08 MED ORDER — FLUPHENAZINE HCL 5 MG PO TABS: 2.5000 mg | ORAL_TABLET | Freq: Three times a day (TID) | ORAL | Status: DC

## 2012-10-08 NOTE — ED Notes (Signed)
Pt says that she prayed about different companies and then used what God gave her and called stoke brokers and gave them tips and they kept wanting more and more tips from her. Pt is religiously preoccupied.   Pt's husband says that we didn't get it right 10 months ago and she's been hospitalized 7 times since they've been married and 4 of them were just for a vacation because she didn't really need to be hospitalized. He mentioned he doesn't know if pt is doing the things she's doing for attention or if there's a real problem behind her behaviors. He said pt acted like she took her medication when he told her to but really she didn't because she thought she was pregnant and even though the doctors have told her that her pregnancy test was negative she wanted a pelvic exam or ultrasound to confirm whether or not she was pregnant.   Pt says that the stress of the pelvic exam could have caused her symptoms this time.   Husband and pt both feel that she's better and can be handled by her doctor at [facility] now rather than her being hospitalized.

## 2012-10-08 NOTE — ED Notes (Signed)
Standing in her doorway 

## 2012-10-08 NOTE — ED Provider Notes (Signed)
0715.  Pt sleep, NAD.  Note stable VS.  No acute overnight events reported.  She awaits placement.  Tobin Chad, MD 10/08/12 [phone]

## 2012-10-08 NOTE — ED Notes (Signed)
Standing in her doorway

## 2012-10-08 NOTE — ED Provider Notes (Signed)
  Physical Exam  BP 119/77  Pulse 74  Temp 97.5 F (36.4 C) (Oral)  Resp 18  SpO2 100%  LMP 09/29/2012  Physical Exam  ED Course  Procedures  MDM Patient has had a total psych evaluation and is recommended discharge. He also requested that the medicines but refilled. She appears to be cleared at this time and will followup.      Juliet Rude. [name], MD 10/08/12 1722

## 2012-10-08 NOTE — ED Notes (Signed)
Spoke to EDP about husband and pt feeling she can be managed outpt now rather than go inpt and EDP said we can repeat Telepsych and see what they say but he's following their recommendations.

## 2012-10-08 NOTE — ED Notes (Signed)
Pt sitting on bed reading a magazine.

## 2012-10-08 NOTE — ED Notes (Signed)
Pt is standing in her doorway

## 2012-10-08 NOTE — [facility] Counselor (Signed)
Telepsych consult rec pt to be discharged. Pt refuses to sign no harm contract. 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 Pt sts that she doesn't want to sign as then the doc could be used by her mother to get her admitted. She says mother unfairly had her admitted when pt was gone from house only 1 hr. Clinical research associate explains content of contract again. Pt replies that her signing would be her confirming that she has SI thoughts and pt sts she handles those thoughts safely. Pt sts her admission of having SI thoughts would lead to her being admitted again against her will. Pt sts she will call her current provider this week and arrange an appt.

## 2012-10-08 NOTE — ED Notes (Signed)
Info called / faxed to [facility]

## 2012-10-08 NOTE — ED Notes (Signed)
Up to bathroom

## 2012-10-08 NOTE — ED Notes (Signed)
Explained to pt that the EDP wants her to talk to the psychiatrist on the monitor again today and see if they change her disposition and allow her to go home and do out patient care.

## 2012-10-19 ENCOUNTER — Other Ambulatory Visit: Payer: Self-pay | Admitting: Family Medicine

## 2012-10-19 DIAGNOSIS — Z1231 Encounter for screening mammogram for malignant neoplasm of breast: Secondary | ICD-10-CM

## 2012-10-28 ENCOUNTER — Other Ambulatory Visit (HOSPITAL_COMMUNITY)
Admission: RE | Admit: 2012-10-28 | Discharge: 2012-10-28 | Disposition: A | Discharge status: Home / Self Care | Payer: Medicare Other | Source: Ambulatory Visit | Attending: Family Medicine | Admitting: Family Medicine

## 2012-10-28 ENCOUNTER — Other Ambulatory Visit: Payer: Self-pay | Admitting: Family Medicine

## 2012-10-28 DIAGNOSIS — Z124 Encounter for screening for malignant neoplasm of cervix: Secondary | ICD-10-CM | POA: Insufficient documentation

## 2012-10-28 DIAGNOSIS — Z113 Encounter for screening for infections with a predominantly sexual mode of transmission: Secondary | ICD-10-CM | POA: Insufficient documentation

## 2012-11-30 ENCOUNTER — Encounter (HOSPITAL_COMMUNITY): Payer: Self-pay | Admitting: Family Medicine

## 2012-11-30 ENCOUNTER — Emergency Department (HOSPITAL_COMMUNITY)
Admission: EM | Admit: 2012-11-30 | Discharge: 2012-11-30 | Disposition: A | Discharge status: Home / Self Care | Payer: Medicare Other | Attending: Emergency Medicine | Admitting: Emergency Medicine

## 2012-11-30 DIAGNOSIS — Z8659 Personal history of other mental and behavioral disorders: Secondary | ICD-10-CM | POA: Insufficient documentation

## 2012-11-30 DIAGNOSIS — Z8679 Personal history of other diseases of the circulatory system: Secondary | ICD-10-CM | POA: Insufficient documentation

## 2012-11-30 DIAGNOSIS — F29 Unspecified psychosis not due to a substance or known physiological condition: Secondary | ICD-10-CM | POA: Insufficient documentation

## 2012-11-30 DIAGNOSIS — Z8739 Personal history of other diseases of the musculoskeletal system and connective tissue: Secondary | ICD-10-CM | POA: Insufficient documentation

## 2012-11-30 DIAGNOSIS — Z79899 Other long term (current) drug therapy: Secondary | ICD-10-CM | POA: Insufficient documentation

## 2012-11-30 DIAGNOSIS — Z008 Encounter for other general examination: Secondary | ICD-10-CM | POA: Insufficient documentation

## 2012-11-30 DIAGNOSIS — F259 Schizoaffective disorder, unspecified: Secondary | ICD-10-CM

## 2012-11-30 DIAGNOSIS — F250 Schizoaffective disorder, bipolar type: Secondary | ICD-10-CM

## 2012-11-30 LAB — URINALYSIS, ROUTINE W REFLEX MICROSCOPIC
Bilirubin Urine: NEGATIVE
Ketones, ur: NEGATIVE mg/dL
Leukocytes, UA: NEGATIVE
Nitrite: NEGATIVE
Specific Gravity, Urine: 1.007 (ref 1.005–1.030)
Urobilinogen, UA: 0.2 mg/dL (ref 0.0–1.0)

## 2012-11-30 LAB — COMPREHENSIVE METABOLIC PANEL
ALT: 19 U/L (ref 0–35)
Alkaline Phosphatase: 65 U/L (ref 39–117)
BUN: 13 mg/dL (ref 6–23)
Chloride: 101 mEq/L (ref 96–112)
GFR calc Af Amer: 83 mL/min — ABNORMAL LOW (ref >90)
Glucose, Bld: 102 mg/dL — ABNORMAL HIGH (ref 70–99)
Potassium: 3.4 mEq/L — ABNORMAL LOW (ref 3.5–5.1)
Sodium: 136 mEq/L (ref 135–145)
Total Bilirubin: 0.3 mg/dL (ref 0.3–1.2)
Total Protein: 7.4 g/dL (ref 6.0–8.3)

## 2012-11-30 LAB — ACETAMINOPHEN LEVEL: Acetaminophen (Tylenol), Serum: <15 ug/mL (ref 10–30)

## 2012-11-30 LAB — RAPID URINE DRUG SCREEN, HOSP PERFORMED
Barbiturates: NOT DETECTED
Benzodiazepines: NOT DETECTED
Cocaine: NOT DETECTED
Tetrahydrocannabinol: NOT DETECTED

## 2012-11-30 LAB — CBC
MCHC: 33.4 g/dL (ref 30.0–36.0)
RDW: 13.5 % (ref 11.5–15.5)

## 2012-11-30 MED ORDER — LORAZEPAM 1 MG PO TABS
1.0000 mg | ORAL_TABLET | Freq: Once | ORAL | Status: AC
  Administered 2012-11-30: 1 mg via ORAL
  Filled 2012-11-30: qty 1

## 2012-11-30 MED ORDER — CLONAZEPAM 0.5 MG PO TABS
0.5000 mg | ORAL_TABLET | Freq: Two times a day (BID) | ORAL | Status: DC
Start: 2012-11-30 — End: 2012-11-30
  Administered 2012-11-30: 0.5 mg via ORAL
  Filled 2012-11-30: qty 1

## 2012-11-30 MED ORDER — FLUPHENAZINE HCL 2.5 MG PO TABS
2.5000 mg | ORAL_TABLET | Freq: Three times a day (TID) | ORAL | Status: DC
Start: 2012-11-30 — End: 2012-11-30
  Administered 2012-11-30: 2.5 mg via ORAL
  Filled 2012-11-30 (×2): qty 1

## 2012-11-30 MED ORDER — CLONAZEPAM 0.5 MG PO TABS: 0.5000 mg | ORAL_TABLET | Freq: Two times a day (BID) | ORAL | Status: DC

## 2012-11-30 MED ORDER — LAMOTRIGINE 25 MG PO TABS: 25.0000 mg | ORAL_TABLET | Freq: Every day | ORAL | Status: DC

## 2012-11-30 MED ORDER — BENZTROPINE MESYLATE 1 MG PO TABS
1.0000 mg | ORAL_TABLET | Freq: Two times a day (BID) | ORAL | Status: DC
  Administered 2012-11-30: 1 mg via ORAL
  Filled 2012-11-30: qty 1

## 2012-11-30 MED ORDER — LAMOTRIGINE 25 MG PO TABS
25.0000 mg | ORAL_TABLET | Freq: Every day | ORAL | Status: DC
  Administered 2012-11-30: 25 mg via ORAL
  Filled 2012-11-30: qty 1

## 2012-11-30 NOTE — ED Notes (Signed)
Pt asked if we could call her husband for her and she gave me the telephone number. Number provided was called and I handed the phone to the pt and she totally wigged out and cut the phone off, sat it down, and walked away. MD notified. Pt had previously stated that technology is bad and that she would not be using the telephone nor watching any tv.

## 2012-11-30 NOTE — ED Provider Notes (Signed)
History     CSN: [id number]  Arrival date & time 11/30/12  0341   First MD Initiated Contact with Patient 11/30/12 [phone]      Chief Complaint  Patient presents with  . Medical Clearance    (Consider location/radiation/quality/duration/timing/severity/associated sxs/prior treatment) HPI 46 yo female presents to the emergency department with her husband who reports that she has had a change in behavior since Thanksgiving. He reports that she is more physical, clingy. He reports she wants to "hug and love on me" all the time. Patient has been confused at times, speaking out of the ordinary. He reports it started up with her playing loud music at all hours. 2 days ago, patient got naked and went to bed, which he reports as not her norm. The next day she was showering without the shower curtain in place. She told him that "I'm waiting for you".   He reports that she cries often, has very labile moods. Her menstrual cycle has been very erratic. Her last menstrual period was 52 days ago, before that it was 36 days, and the one before that was 22. She was seen by her primary care doctor yesterday who did a women's exam. At that time they did a pregnancy test which he reports was equivocal and she is to return in 2 days to have a blood test again.  Patient was admitted in October, and husband report after discharge she was doing much better. He reports that they dropped her Prolixin from 10 mg a day to 7.5. He is unsure if she is taking her a.m. dosing. He reports he leaves the pills out for her, but does not watch her take them. Occasionally he does not watch her take her afternoon dose in. He reports that she is not sleeping, up all hours of the night.   Past Medical History  Diagnosis Date  . Headache   . Paranoid schizophrenia   . Depression   . Schizophrenia   . Low back pain     History reviewed. No pertinent past surgical history.  Family History  Problem Relation Age of Onset  .  Diabetes    . Hypertension    . Breast cancer      History  Substance Use Topics  . Smoking status: Never Smoker   . Smokeless tobacco: Not on file  . Alcohol Use: No    OB History    Grav Para Term Preterm Abortions TAB SAB Ect Mult Living                  Review of Systems  Unable to perform ROS: Mental status change    Allergies  Divalproex sodium; Risperidone; and Sulfonamide derivatives  Home Medications   Current Outpatient Rx  Name  Route  Sig  Dispense  Refill  . BENZTROPINE MESYLATE 1 MG PO TABS   Oral   Take 1 tablet (1 mg total) by mouth 2 (two) times daily. To Prevent EPS.   60 tablet   0   . FLUPHENAZINE HCL 5 MG PO TABS   Oral   Take 2.5 mg by mouth 3 (three) times daily.         Marland Kitchen MELATONIN 5 MG PO TABS   Oral   Take 1 tablet by mouth at bedtime. scheduled           BP 132/87  Pulse 96  Temp 98.7 F (37.1 C) (Oral)  Resp 18  SpO2 100%  LMP 11/17/2012  Physical Exam  Nursing note and vitals reviewed. Constitutional: She is oriented to person, place, and time. She appears well-developed and well-nourished.  HENT:  Head: Normocephalic and atraumatic.  Nose: Nose normal.  Mouth/Throat: Oropharynx is clear and moist.       Patient noted be hirsute   Eyes: Conjunctivae normal and EOM are normal. Pupils are equal, round, and reactive to light.  Neck: Normal range of motion. Neck supple. No JVD present. No tracheal deviation present. No thyromegaly present.  Cardiovascular: Normal rate, regular rhythm, normal heart sounds and intact distal pulses.  Exam reveals no gallop and no friction rub.   No murmur heard. Pulmonary/Chest: Effort normal and breath sounds normal. No stridor. No respiratory distress. She has no wheezes. She has no rales. She exhibits no tenderness.  Abdominal: Soft. Bowel sounds are normal. She exhibits no distension and no mass. There is no tenderness. There is no rebound and no guarding.  Musculoskeletal: Normal range  of motion. She exhibits no edema and no tenderness.  Lymphadenopathy:    She has no cervical adenopathy.  Neurological: She is alert and oriented to person, place, and time. She has normal reflexes. No cranial nerve deficit. She exhibits normal muscle tone. Coordination normal.  Skin: Skin is warm and dry. No rash noted. No erythema. No pallor.  Psychiatric: She has a normal mood and affect. Her behavior is normal. Judgment normal.       During my valuation of her, patient comes abscess with the idea that she drank tequila as a teenager which "caused me to be this way" patient has not drank tequila since this time, but thinks that she's been eating a lot of [place] food recently and this has caused her to become altered    ED Course  Procedures (including critical care time)  Labs Reviewed  COMPREHENSIVE METABOLIC PANEL - Abnormal; Notable for the following:    Potassium 3.4 (*)     Glucose, Bld 102 (*)     GFR calc non Af Amer 72 (*)     GFR calc Af Amer 83 (*)     All other components within normal limits  SALICYLATE LEVEL - Abnormal; Notable for the following:    Salicylate Lvl <2.0 (*)     All other components within normal limits  URINALYSIS, ROUTINE W REFLEX MICROSCOPIC - Abnormal; Notable for the following:    Hgb urine dipstick MODERATE (*)     All other components within normal limits  ACETAMINOPHEN LEVEL  CBC  ETHANOL  URINE RAPID DRUG SCREEN (HOSP PERFORMED)  HCG, QUANTITATIVE, PREGNANCY  URINE MICROSCOPIC-ADD ON   No results found.   1. Psychosis       MDM   46 year old female with what sounds to be worsening of her schizophrenia. She is not suicidal or homicidal. She is very reluctant to be admitted to a psychiatric facility, and husband feels the same. Husband feels like he is able to take care of her at home, but is confused and frustrated with her current behaviors. They both declined telepsych at this time, as patient has had bad results with this in the  past. Will have her seen by the a.m. psychiatrist for medication recommendations and evaluation       Charlotte Mackie, MD 11/30/12 [phone]

## 2012-11-30 NOTE — ED Notes (Addendum)
Per husband, patient has been having "radical behavior since leaving here before Thanksgiving." Had blood pregnancy test done at Dr. Zachery Dauer' office today and was told that her result was "iffy" and is scheduled to have repeat pregnancy test done this Thursday. States that patient is not sleeping and not sure if she has been taking her medication. States that patient has been craving dairy products and that she is having difficulty remembering things. Patient denies SI/HI.

## 2012-11-30 NOTE — ED Notes (Signed)
Pt's husband reports that the "radical behavior" consisted of the pt being "clingy" with the husband.  Pt's husband also reports that he found his clothing taken out of the closet and placed in a spare bedroom closet.  Then a neighbor left the house and the husband found his wife naked.  On another instance, the husband found the pt taking a shower in the morning without the shower curtain pulled over.  The pt denies remembering these specific events.  Pt has a has a focus on things western and states "I like western things.  I like the style of clothing and the houses."  Pt denies SI or HI.

## 2012-11-30 NOTE — Consult Note (Signed)
Reason for Consult: Manic symptoms, bizarre behaviors, insomnia Referring Physician: Dr. Teola Bradley is an 46 y.o. female.  HPI: Patient was known to this provider from her [name]. Evaluation on 10/06/2012. Patient was brought in by her husband with the complaints of bizarre behavior, playing the radio allowed, hypersexuality, mood swings, decreased concentration, forgetfulness, and delusional about being pregnant. Patient medication was Prolixin 2.5 mg 3 times a day and Cogentin 1 mg twice daily. Patient also takes melatonin 5 mg at bedtime for sleep, but not helpful. Patient denied current symptoms of for suicidal ideation, intentions, or plans. Patient has no homicidal ideation, intention, or plan. Patient has no evidence of psychotic symptoms. The patient has a, cognitively intact with the her name, date of birth current time, date, month, and year. Patient reported she was confused, because she was not sleeping well. She sleeps only 2 hours a night. Patient was well rested, but more than 5 hours after taking Ativan 1 mg today morning. Patient has been requested. The brain scan for possible brain tumor, but she does not have a signs of for neurological abnormalities and also has no family history. Patient has no drug of abuse or alcoholism. Patient urine drug screen was negative for drug of abuse and the blood alcohol was not significant. Patient and her husband weren't willing to followup with outpatient psychiatric services and medication adjustment during this visit.  MSE: Patient was calm, quite cooperative and pleasant during this evaluation. Patient reported mood, and doing fine. Her affect was constricted. She is a normal rate, rhythm, and volume of speech. Her thought processes linear and goal-directed. She denied suicidal, homicidal ideations, has no evidence of psychotic symptoms during this visit.  Past Medical History  Diagnosis Date  .  Headache   . Paranoid schizophrenia   . Depression   . Schizophrenia   . Low back pain     History reviewed. No pertinent past surgical history.  Family History  Problem Relation Age of Onset  . Diabetes    . Hypertension    . Breast cancer      Social History:  reports that she has never smoked. She does not have any smokeless tobacco history on file. She reports that she does not drink alcohol or use illicit drugs.  Allergies:  Allergies  Allergen Reactions  . Divalproex Sodium     Causing tremors  . Risperidone Itching    Causes altered mental status  . Sulfonamide Derivatives     REACTION: Eye irritation    Medications: I have reviewed the patient's current medications.  Results for orders placed during the hospital encounter of 11/30/12 (from the past 48 hour(s))  URINE RAPID DRUG SCREEN (HOSP PERFORMED)     Status: Normal   Collection Time   11/30/12  4:15 AM      Component Value Range Comment   Opiates NONE DETECTED  NONE DETECTED    Cocaine NONE DETECTED  NONE DETECTED    Benzodiazepines NONE DETECTED  NONE DETECTED    Amphetamines NONE DETECTED  NONE DETECTED    Tetrahydrocannabinol NONE DETECTED  NONE DETECTED    Barbiturates NONE DETECTED  NONE DETECTED   URINALYSIS, ROUTINE W REFLEX MICROSCOPIC     Status: Abnormal   Collection Time   11/30/12  4:20 AM      Component Value Range Comment   Color, Urine YELLOW  YELLOW    APPearance CLEAR  CLEAR    Specific  Gravity, Urine 1.007  1.005 - 1.030    pH 6.5  5.0 - 8.0    Glucose, UA NEGATIVE  NEGATIVE mg/dL    Hgb urine dipstick MODERATE (*) NEGATIVE    Bilirubin Urine NEGATIVE  NEGATIVE    Ketones, ur NEGATIVE  NEGATIVE mg/dL    Protein, ur NEGATIVE  NEGATIVE mg/dL    Urobilinogen, UA 0.2  0.0 - 1.0 mg/dL    Nitrite NEGATIVE  NEGATIVE    Leukocytes, UA NEGATIVE  NEGATIVE   URINE MICROSCOPIC-ADD ON     Status: Normal   Collection Time   11/30/12  4:20 AM      Component Value Range Comment   Squamous  Epithelial / LPF RARE  RARE    WBC, UA 0-2  <3 WBC/hpf    RBC / HPF 3-6  <3 RBC/hpf    Bacteria, UA RARE  RARE   ACETAMINOPHEN LEVEL     Status: Normal   Collection Time   11/30/12  4:22 AM      Component Value Range Comment   Acetaminophen (Tylenol), Serum <15.0  10 - 30 ug/mL   CBC     Status: Normal   Collection Time   11/30/12  4:22 AM      Component Value Range Comment   WBC 10.1  4.0 - 10.5 K/uL    RBC 4.73  3.87 - 5.11 MIL/uL    Hemoglobin 13.6  12.0 - 15.0 g/dL    HCT 84.1  32.4 - 40.1 %    MCV 86.0  78.0 - 100.0 fL    MCH 28.8  26.0 - 34.0 pg    MCHC 33.4  30.0 - 36.0 g/dL    RDW 02.7  25.3 - 66.4 %    Platelets 313  150 - 400 K/uL   COMPREHENSIVE METABOLIC PANEL     Status: Abnormal   Collection Time   11/30/12  4:22 AM      Component Value Range Comment   Sodium 136  135 - 145 mEq/L    Potassium 3.4 (*) 3.5 - 5.1 mEq/L    Chloride 101  96 - 112 mEq/L    CO2 26  19 - 32 mEq/L    Glucose, Bld 102 (*) 70 - 99 mg/dL    BUN 13  6 - 23 mg/dL    Creatinine, Ser 4.03  0.50 - 1.10 mg/dL    Calcium 9.9  8.4 - 47.4 mg/dL    Total Protein 7.4  6.0 - 8.3 g/dL    Albumin 4.2  3.5 - 5.2 g/dL    AST 25  0 - 37 U/L    ALT 19  0 - 35 U/L    Alkaline Phosphatase 65  39 - 117 U/L    Total Bilirubin 0.3  0.3 - 1.2 mg/dL    GFR calc non Af Amer 72 (*) >90 mL/min    GFR calc Af Amer 83 (*) >90 mL/min   ETHANOL     Status: Normal   Collection Time   11/30/12  4:22 AM      Component Value Range Comment   Alcohol, Ethyl (B) <11  0 - 11 mg/dL   SALICYLATE LEVEL     Status: Abnormal   Collection Time   11/30/12  4:22 AM      Component Value Range Comment   Salicylate Lvl <2.0 (*) 2.8 - 20.0 mg/dL   HCG, QUANTITATIVE, PREGNANCY     Status: Normal   Collection Time  11/30/12  4:22 AM      Component Value Range Comment   hCG, Beta Chain, Quant, S <1  <5 mIU/mL     No results found.  Positive for anxiety, bipolar, mood swings, sleep disturbance and Bizarre behaviors and  hypersexual behaviors Blood pressure 116/79, pulse 79, temperature 97.5 F (36.4 C), temperature source Oral, resp. rate 16, last menstrual period 11/17/2012, SpO2 100.00%.   Assessment/Plan: Schizoaffective disorder chronic most recent episode mania  Recommendations: Patient does not meet criteria for inpatient psychiatric hospitalization. Patient medication will be adjusted as below. Patient will continue taking fluphenazine 2.5 mg 3 times a day for psychosis, benztropine 1 mg twice daily for extrapyramidal symptoms and Lamictal 25 mg at bedtime for mania, and clonazepam 0.5 mg 2 times daily for anxiety. Patient has a scheduled appointment with the primary care psychiatry at [facility].  Makayela Secrest,[name] 11/30/2012, 3:22 PM

## 2012-11-30 NOTE — ED Notes (Signed)
Pt came in with a necklace on.  Pt. Necklace is locked in locker number [id number].

## 2012-11-30 NOTE — ED Notes (Signed)
Otter, MD at bedside.  

## 2012-11-30 NOTE — ED Notes (Signed)
Pt had one necklace with pearl on the end that was returned to pt at discharge. Husband arrived with pts clothing and belongings from home. Pt and husband escorted to discharge with security.

## 2012-11-30 NOTE — ED Notes (Signed)
Pt pacing the floor; anxiety meds given. Pt states she is waiting for her husband.

## 2012-11-30 NOTE — [facility] Suicide Risk Assessment (Signed)
Suicide Risk Assessment  Discharge Assessment     Demographic Factors:  Adolescent or young adult, Caucasian and Low socioeconomic status  Mental Status Per Nursing Assessment::   On Admission:     Current Mental Status by Physician: Patient was well rested after 1 mg of for Ativan, and than has fine mood with appropriate affect. Patient has no suicidal, homicidal ideation, intention, or plan. Patient does not appear to psychotic at this time.  Loss Factors: Financial problems/change in socioeconomic status  Historical Factors: Impulsivity  Risk Reduction Factors:   Sense of responsibility to family, Living with another person, especially a relative, Positive social support, Positive therapeutic relationship and Positive coping skills or problem solving skills  Continued Clinical Symptoms:  Severe Anxiety and/or Agitation Bipolar Disorder:   Mixed State Depression:   Impulsivity Insomnia Recent sense of peace/wellbeing Severe Previous Psychiatric Diagnoses and Treatments Medical Diagnoses and Treatments/Surgeries  Cognitive Features That Contribute To Risk:  Loss of executive function Polarized thinking    Suicide Risk:  Minimal: No identifiable suicidal ideation.  Patients presenting with no risk factors but with morbid ruminations; may be classified as minimal risk based on the severity of the depressive symptoms  Discharge Diagnoses:   AXIS I:  Schizoaffective Disorder AXIS II:  Deferred AXIS III:   Past Medical History  Diagnosis Date  . Headache   . Paranoid schizophrenia   . Depression   . Schizophrenia   . Low back pain    AXIS IV:  economic problems and problems related to social environment AXIS V:  51-60 moderate symptoms  Plan Of Care/Follow-up recommendations:  Activity:  as tolerated Diet:  Regular  Is patient on multiple antipsychotic therapies at discharge:  No   Has Patient had three or more failed trials of antipsychotic monotherapy by  history:  No  Recommended Plan for Multiple Antipsychotic Therapies: Not applicable  Julian Askin,[name] 11/30/2012, 3:33 PM

## 2012-11-30 NOTE — Progress Notes (Signed)
CARE MANAGEMENT ED NOTE 11/30/2012  Patient:  [name], [name]   Account Number:  [id number]  Date Initiated:  11/30/2012  Documentation initiated by:  Edd Arbour  Subjective/Objective Assessment:   46 Yr old female with [employer] medicare complete ready for d/c Schizoaffective disorder chronic most recent episode mania     Subjective/Objective Assessment Detail:   [facility] ED Psych RN stating pt needs medications for d/c. Husband first denied pt had insurance Husband then confirmed pt has [employer] medicare complete coverage He took the insurance card out of his wallet Husband requesting cost of d/c medications (Klonopin & Lamictal) [facility]ses [facility] to [facility] Rx per husband [facility] to [facility] out the cost of [facility]/c medicines. Husband informed CM & RN [facility] will not tell him the cost until after filling the medication Husband refuse to take pt home until find out cost of medicine Husband states he is disabled     Action/Plan:   Cm reviewed EPIC to find that pt has [employer] complete insurance coverage.  Confirmed with husband that pt does have AARP complete coverage Informed husband that [facility] is unable to assist pt with medications for d/c   Action/Plan Detail:   CM encouraged husband to contact the medication vendor for his insurance carrier to find out the cost of klonopin & lamictal Encouraged Security to show husband to conference room to have time to speak with his insurance company   Anticipated DC Date:  11/30/2012     Status Recommendation to Physician:   Result of Recommendation:    Other ED Services  Consult Working Plan    DC Planning Services  CM consult  Outpatient Services - Pt will follow up    Choice offered to / List presented to:            Status of service:  Completed, signed off

## 2012-11-30 NOTE — Progress Notes (Signed)
1629 CM contacted [name] at [facility] outpatient pharmacy to inquire if there was another way pt could find out the cost of her medication Charlotte [name] confirmed aarp medicare complete cost of medication can only be found out by contacting insurance carrier

## 2013-01-06 ENCOUNTER — Ambulatory Visit: Payer: Self-pay

## 2013-01-11 ENCOUNTER — Ambulatory Visit (HOSPITAL_COMMUNITY)
Admission: RE | Admit: 2013-01-11 | Discharge: 2013-01-11 | Disposition: A | Discharge status: Home / Self Care | Payer: 59 | Attending: Psychiatry | Admitting: Psychiatry

## 2013-01-11 DIAGNOSIS — F652 Exhibitionism: Secondary | ICD-10-CM | POA: Insufficient documentation

## 2013-01-11 DIAGNOSIS — F200 Paranoid schizophrenia: Secondary | ICD-10-CM | POA: Insufficient documentation

## 2013-01-11 NOTE — BH Assessment (Signed)
Assessment Note   [name] is an 47 y.o. female. PT WAS D/C FROM [facility] YESTERDAY AND OV MADE APPOINTMENT  THROUGH  [facility] FOR PT TO BE ASSESSED FOR PSYCH - IOP. PT REPORTS SHE HAS A HISTORY OF PARANOID SCHIZOPHRENIA, DEPRESSION AND EXHIBITIONISM REPORTING THAT SOME TIMES SHE BLACKS OUT AND TAKES HER CLOTHES OFF AND DO INAPPROPRIATE THINGS.  PT REPORTS SHE KNOWS OF 14 SUICIDE ATTEMPTS OF WHICH ALL WERE NOT REPORTED.  SHE OVERDOSED ON EACH OCCASION. SHE REPORTS SHE MAY BE ADDICTED TO RISPERDAL BUT IS NOT SURE. PT  WAS REFERRED TO PSYCH-IOP TO GET HELP WITH SELF-MOTIVATION,NOT CONCENTRATING CONFUSION WANTING TO HAVE A BABY AND FEELING SHE HAS A MENTALITY OF A 47 YR OLD.  BUT NEVER DIAGNOSED. PT REPORTS SHE HAS NOT HAD A SUICIDE ATTEMPT IN OVER  ONE YEAR AND CURRENTLY DENIES S/I BUT SUICIDAL PAMPHLET WAS GIVEN TO PATIENT. PT DENIES H/I AND IS NOT PSYCHOTIC. NO EMS NEEDED.  PT ALSO REPORTS SHE DOES NOT HAVE SUPPORT FROM HER FAMILY AND FEELS LIKE SHE IS TREATED RIGHT. SHE REPORTS THE ONLY TIME HER MOTHER WILL SEE HER IS WHEN SHE HAD TO BE ADMITTED TO A [facility].Marland Kitchen  THIS SEEMS TO PLAY A BIG ROLE IN PT'S MENTAL STABILITY.     Axis I: SCHIZOPHRENIA PARANOID TYPE, EXHIBITIONISM Axis II: Deferred Axis III:  Past Medical History  Diagnosis Date  . Headache   . Paranoid schizophrenia   . Depression   . Schizophrenia   . Low back pain    Axis IV: other psychosocial or environmental problems and problems with primary support group Axis V: 51-60 moderate symptoms  Past Medical History:  Past Medical History  Diagnosis Date  . Headache   . Paranoid schizophrenia   . Depression   . Schizophrenia   . Low back pain     No past surgical history on file.  Family History:  Family History  Problem Relation Age of Onset  . Diabetes    . Hypertension    . Breast cancer      Social History:  reports that she has never smoked. She does not have any smokeless tobacco history on  file. She reports that she does not drink alcohol or use illicit drugs.  Additional Social History:  Alcohol / Drug Use Pain Medications: NA Prescriptions: NA Over the Counter: NA History of alcohol / drug use?: Yes Substance #1 Name of Substance 1: ETOH 1 - Age of First Use: 15 1 - Amount (size/oz): WHAT EVER HER DAD LEFT AROUND THE HOUSE 1 - Frequency: 2-3 X WEEK 1 - Duration: 14 YRS 1 - Last Use / Amount: AGE 28  CIWA:   COWS:    Allergies:  Allergies  Allergen Reactions  . Divalproex Sodium     Causing tremors  . Risperidone Itching    Causes altered mental status  . Sulfonamide Derivatives     REACTION: Eye irritation    Home Medications:  (Not in a hospital admission)  OB/GYN Status:  No LMP recorded.  General Assessment Data Location of Assessment: [facility] Assessment Services Living Arrangements: Spouse/significant other Can pt return to current living arrangement?: Yes Admission Status: Voluntary Is patient capable of signing voluntary admission?: Yes Transfer from: Home Referral Source: Psychiatrist (DR REDDY AT [facility])  Education Status Is patient currently in school?: No Highest grade of school patient has completed: SOME COLLEGE  Risk to self Suicidal Ideation: No Suicidal Intent: No Is patient at risk for suicide?:  No Suicidal Plan?: No Access to Means: No What has been your use of drugs/alcohol within the last 12 months?: ALCOHOL Previous Attempts/Gestures: Yes How many times?: 14  Other Self Harm Risks: NA Triggers for Past Attempts: Family contact;Other personal contacts;Unpredictable;Other (Comment) (PSYCHOSIS) Intentional Self Injurious Behavior: None Family Suicide History: No Recent stressful life event(s): Other (Comment) (WANTS TO HAVE A BABY) Persecutory voices/beliefs?: No Depression: Yes Depression Symptoms: Despondent;Loss of interest in usual pleasures;Isolating;Fatigue;Feeling worthless/self pity Substance abuse history  and/or treatment for substance abuse?: Yes Suicide prevention information given to non-admitted patients: Yes  Risk to Others Homicidal Ideation: No Thoughts of Harm to Others: No Current Homicidal Intent: No Current Homicidal Plan: No Access to Homicidal Means: No Identified Victim: NA History of harm to others?: No Assessment of Violence: None Noted Violent Behavior Description: NA Does patient have access to weapons?: No Criminal Charges Pending?: No Does patient have a court date: No  Psychosis Hallucinations: None noted Delusions: None noted  Mental Status Report Appear/Hygiene: Improved Eye Contact: Good Motor Activity: Freedom of movement Speech: Logical/coherent Level of Consciousness: Alert Mood: Depressed;Helpless Affect: Appropriate to circumstance;Depressed;Sad Anxiety Level: None Thought Processes: Coherent;Relevant Judgement: Unimpaired Orientation: Person;Place;Time;Situation Obsessive Compulsive Thoughts/Behaviors: None  Cognitive Functioning Concentration: Normal Memory: Recent Intact;Remote Intact IQ: Average Insight: Good Impulse Control: Good Appetite: Good Sleep: No Change Total Hours of Sleep: 7  Vegetative Symptoms: None  ADLScreening [facility] Assessment Services) Patient's cognitive ability adequate to safely complete daily activities?: Yes Patient able to express need for assistance with ADLs?: Yes Independently performs ADLs?: Yes (appropriate for developmental age)  Abuse/Neglect [facility]) Physical Abuse: Denies Verbal Abuse: Denies Sexual Abuse: Denies  Prior Inpatient Therapy Prior Inpatient Therapy: Yes Prior Therapy Dates: UNK Prior Therapy Facilty/Provider(s): [facility] X 4  [facility] X 2 Reason for Treatment: PSYCHOSIS, SUICIDAL  Prior Outpatient Therapy Prior Outpatient Therapy: Yes Prior Therapy Dates: CURRENTLY Prior Therapy Facilty/Provider(s): [facility] Reason for Treatment: PSYCHOTIC, SUICIDAL  ADL Screening (condition at  time of admission) Patient's cognitive ability adequate to safely complete daily activities?: Yes Patient able to express need for assistance with ADLs?: Yes Independently performs ADLs?: Yes (appropriate for developmental age) Weakness of Legs: None Weakness of Arms/Hands: None     Therapy Consults (therapy consults require a physician order) PT Evaluation Needed: No OT Evalulation Needed: No SLP Evaluation Needed: No Abuse/Neglect Assessment (Assessment to be complete while patient is alone) Physical Abuse: Denies Verbal Abuse: Denies Sexual Abuse: Denies Exploitation of patient/patient's resources: Denies Self-Neglect: Denies Values / Beliefs Cultural Requests During Hospitalization: None Consults Spiritual Care Consult Needed: No Social Work Consult Needed: No Merchant navy officer (For Healthcare) Advance Directive: Patient does not have advance directive;Patient would not like information Pre-existing out of facility DNR order (yellow form or pink MOST form): No    Additional Information 1:1 In Past 12 Months?: No CIRT Risk: No Elopement Risk: No Does patient have medical clearance?: Yes     Disposition: [facility]ERRED FROM [facility] FOR [facility]OP APPOINTMENT NO MSE NECESSARY  Disposition Disposition of Patient: Outpatient treatment Type of outpatient treatment: Psych Intensive Outpatient  On Site Evaluation by:   Reviewed with Physician:     Hattie Perch Winford 01/11/2013 5:05 PM

## 2013-01-30 ENCOUNTER — Ambulatory Visit (INDEPENDENT_AMBULATORY_CARE_PROVIDER_SITE_OTHER): Payer: Self-pay | Admitting: Psychiatry

## 2013-01-30 ENCOUNTER — Encounter (HOSPITAL_COMMUNITY): Payer: Self-pay | Admitting: Psychiatry

## 2013-01-30 VITALS — BP 131/91 | HR 88 | Wt 191.6 lb

## 2013-01-30 DIAGNOSIS — F200 Paranoid schizophrenia: Secondary | ICD-10-CM

## 2013-01-30 MED ORDER — VENLAFAXINE HCL ER 37.5 MG PO CP24: 37.5000 mg | ORAL_CAPSULE | Freq: Every day | ORAL | Status: DC

## 2013-01-30 MED ORDER — VALPROIC ACID 250 MG PO CAPS: 250.0000 mg | ORAL_CAPSULE | Freq: Every day | ORAL | Status: DC

## 2013-01-30 MED ORDER — BENZTROPINE MESYLATE 1 MG PO TABS: 1.0000 mg | ORAL_TABLET | Freq: Two times a day (BID) | ORAL | Status: DC

## 2013-01-30 MED ORDER — FLUPHENAZINE HCL 5 MG PO TABS: 5.0000 mg | ORAL_TABLET | Freq: Two times a day (BID) | ORAL | Status: DC

## 2013-01-30 MED ORDER — OLANZAPINE 10 MG PO TBDP: 20.0000 mg | ORAL_TABLET | Freq: Every day | ORAL | Status: DC

## 2013-01-30 NOTE — Progress Notes (Addendum)
Chief complaint I cannot see my psychiatrist due to insurance reason.  I need a new psychiatrist.  History presenting illness Patient is 47 year old Caucasian married female who came with her husband for her appointment.  Patient is a poor historian and most of the information was obtained from her husband.  Patient was recently discharged from old [facility] due to psychotic episode.  Patient was seeing Dr. Betti Cruz [facility] but due to recent insurance change, her psychiatrist is out of network.  As per husband patient has a long history of schizophrenia.  In past 2 years she's been hospitalized at least 6 times.  Her last psychiatric discharge paper shows that she is taking Zyprexa 40 mg, Trileptal, lorazepam and Cogentin 1 mg twice a day.  She's also taking Effexor 37.5 mg daily as per husband and the medicine is working very well.  She cannot manage her medication by herself, she stays most of the time at home and continues to engage in erratic behavior.  Patient has a history of anger problem and recently she tear up discharge papers.  Husband belief that patient was doing better on Prolixin, valproic acid in the past.  He had tried Dr. Betti Cruz and old [facility] psychiatrist to change the medication but it was not done.  I also reviewed patient's discharge summary and multiple visits that she had in past few months.  Patient has history of disorganized behavior, taking her cloths off, delusional thinking that she is pregnant and feel that people are talking to her.  In the past she had tried Risperdal, Haldol, inVega, Prolixin, Lamictal, Klonopin and recently Zyprexa.  Patient continues to believe that she is spiritually pregnant and sometime hear peoples voices.  Her husband is very supportive and believe that she has a brain of 47 year-old.  Patient and her husband admitted that patient had tried to kill herself few times but do not remember the details.  There has been a questionable  history of taking overdose in past.  Patient and her husband feels that current medication is making her very dizzy, sleepy and groggy.  As per husband patient is still has agitation anger and some mood swing.  She sleeps all night.  She does not interact with people and had a limited social contact.  Patient does talk to herself sometime to calm herself.  Patient also has history of giving things to strangers and sometime destroying the box and pans in the kitchen.  Patient believe that she is a God loving person and try to help people by giving food and clothes.  Patient denies any tremors or shakes at this time limited to provide any details.  She's not drinking or using any illegal substance.  Past psychiatric history Patient has extensive history of psychiatric illness.  As per chart repeated hospitalization either due to noncompliance of medication or disorganized behavior.  As per husband best medication regime was Prolixin, Depakote in the past.  There is a history of noncompliance with medication.  There is also a history of suicidal attempt in the past but patient do not remember very well.  No history of ECT treatment.  Psychosocial history Patient was born and raised in [place].  She's been married for 16 years.  Husband is very supportive.  She has no children.  Patient has a very limited contact with her family.  Patient has been sister but they don't talk to her normally.  Family history As per husband patient's grandparents  committed suicide.  Patient's brother do drugs.  Alcohol and substance use history Patient admitted history of using drugs in the college but no recent history of drinking or using drugs.  Education background and work history Patient has college education.  She's unemployed.  Medical history Patient has low back pain and chronic headache.  She is obese female.  Review of Systems  Constitutional: Negative.   Musculoskeletal: Positive for back pain.   Neurological: Negative.   Psychiatric/Behavioral: Positive for hallucinations. The patient is nervous/anxious.    Mental status examination Patient is obese female who is casually dressed and fairly groomed.  She is a poor historian and maintained poor eye contact.  Her speech is slow and at times rambling and incoherent.  There is a thought blocking in her thought process is tangential at times.  She's cooperative in conversation but remained disorganized and paranoid.  She has delusion being pregnant.  She endorse people talk about herself but patient was cooperative in conversation.  She denies any active or passive suicidal thoughts or homicidal thoughts.  She endorse sometimes visual hallucinations and auditory hallucination.  Her attention and concentration is poor.  There were mild tremors in her hand.  There is a poverty of thought content.  Her psychomotor activity is slow.  She's oriented x2.  Her insight judgment is fair.  Her impulse control is okay.  Assessment Axis I paranoid schizophrenia chronic Axis II deferred Axis III obesity, chronic back pain and headache Axis IV moderate Axis V 45-50  Plan I review her symptoms,  discharge summary and collateral information.  Most of the time patient is on Prolixin as per notes and has been believe that she does well when she takes Prolixin.  I will start Prolixin 5 mg twice a day and reduce Zyprexa from 40 mg to 20 mg at bedtime.  I will also discontinue Trileptal and try Depakote 250 mg at bedtime.  Patient does not want lorazepam or Klonopin at this time so I will discontinue lorazepam.  I will continue Cogentin and Effexor at present does.  We will schedule appointment with therapist in this office for coping and social skills.  Risk and benefit explain in detail with the patient and her husband.  Recommend to call us if she feels worsening of the symptom or if she has any question.  I will see her again in 2 weeks.  We will gradually taper  Zyprexa increased Prolixin.  Patient will be a candidate for Prolixin Decanoate in the future if there is question of noncompliance.  I'll see her again in 2 weeks.  Time spent 60 minutes.

## 2013-02-01 ENCOUNTER — Encounter (HOSPITAL_COMMUNITY): Payer: Self-pay | Admitting: Psychiatry

## 2013-02-01 ENCOUNTER — Encounter (HOSPITAL_COMMUNITY): Payer: Self-pay

## 2013-02-01 ENCOUNTER — Emergency Department (HOSPITAL_COMMUNITY)
Admission: EM | Admit: 2013-02-01 | Discharge: 2013-02-03 | Disposition: A | Discharge status: Other Acute Inpatient Hospital | Payer: Medicare Other | Attending: Emergency Medicine | Admitting: Emergency Medicine

## 2013-02-01 ENCOUNTER — Ambulatory Visit (INDEPENDENT_AMBULATORY_CARE_PROVIDER_SITE_OTHER): Payer: Self-pay | Admitting: Psychiatry

## 2013-02-01 VITALS — BP 109/79 | HR 101 | Wt 191.0 lb

## 2013-02-01 DIAGNOSIS — F200 Paranoid schizophrenia: Secondary | ICD-10-CM

## 2013-02-01 DIAGNOSIS — F259 Schizoaffective disorder, unspecified: Secondary | ICD-10-CM

## 2013-02-01 DIAGNOSIS — F329 Major depressive disorder, single episode, unspecified: Secondary | ICD-10-CM | POA: Insufficient documentation

## 2013-02-01 DIAGNOSIS — Z79899 Other long term (current) drug therapy: Secondary | ICD-10-CM | POA: Insufficient documentation

## 2013-02-01 DIAGNOSIS — F3289 Other specified depressive episodes: Secondary | ICD-10-CM | POA: Insufficient documentation

## 2013-02-01 DIAGNOSIS — Z8739 Personal history of other diseases of the musculoskeletal system and connective tissue: Secondary | ICD-10-CM | POA: Insufficient documentation

## 2013-02-01 LAB — COMPREHENSIVE METABOLIC PANEL
ALT: 21 U/L (ref 0–35)
BUN: 9 mg/dL (ref 6–23)
CO2: 23 mEq/L (ref 19–32)
Calcium: 9.6 mg/dL (ref 8.4–10.5)
Creatinine, Ser: 0.74 mg/dL (ref 0.50–1.10)
GFR calc Af Amer: >90 mL/min (ref >90)
GFR calc non Af Amer: >90 mL/min (ref >90)
Glucose, Bld: 117 mg/dL — ABNORMAL HIGH (ref 70–99)

## 2013-02-01 LAB — CBC WITH DIFFERENTIAL/PLATELET
Eosinophils Relative: 1 % (ref 0–5)
HCT: 40.4 % (ref 36.0–46.0)
Lymphocytes Relative: 13 % (ref 12–46)
Lymphs Abs: 1.2 10*3/uL (ref 0.7–4.0)
MCV: 85.8 fL (ref 78.0–100.0)
Monocytes Absolute: 1 10*3/uL (ref 0.1–1.0)
Monocytes Relative: 11 % (ref 3–12)
RBC: 4.71 MIL/uL (ref 3.87–5.11)
WBC: 9 10*3/uL (ref 4.0–10.5)

## 2013-02-01 LAB — ETHANOL: Alcohol, Ethyl (B): <11 mg/dL (ref 0–11)

## 2013-02-01 MED ORDER — LORAZEPAM 2 MG/ML IJ SOLN
2.0000 mg | Freq: Once | INTRAMUSCULAR | Status: AC
  Administered 2013-02-01: 2 mg via INTRAMUSCULAR
  Filled 2013-02-01: qty 1

## 2013-02-01 MED ORDER — IBUPROFEN 600 MG PO TABS: 600.0000 mg | ORAL_TABLET | Freq: Three times a day (TID) | ORAL | Status: DC | PRN

## 2013-02-01 MED ORDER — ACETAMINOPHEN 325 MG PO TABS: 650.0000 mg | ORAL_TABLET | ORAL | Status: DC | PRN

## 2013-02-01 MED ORDER — LORAZEPAM 1 MG PO TABS
1.0000 mg | ORAL_TABLET | Freq: Three times a day (TID) | ORAL | Status: DC | PRN
  Administered 2013-02-01 – 2013-02-02 (×2): 1 mg via ORAL
  Filled 2013-02-01 (×2): qty 1

## 2013-02-01 MED ORDER — ZIPRASIDONE MESYLATE 20 MG IM SOLR
10.0000 mg | Freq: Once | INTRAMUSCULAR | Status: AC
  Administered 2013-02-01: 10 mg via INTRAMUSCULAR
  Filled 2013-02-01: qty 20

## 2013-02-01 MED ORDER — HALOPERIDOL LACTATE 5 MG/ML IJ SOLN
10.0000 mg | Freq: Once | INTRAMUSCULAR | Status: AC
  Administered 2013-02-01: 10 mg via INTRAMUSCULAR
  Filled 2013-02-01: qty 2

## 2013-02-01 NOTE — ED Notes (Signed)
Pt brought in by husband, states sent here by [facility] d/t paranoid and agitation, states her meds where were changed monday

## 2013-02-01 NOTE — Progress Notes (Signed)
Chief complaint As per husband my life cannot sleep at night.  History presenting illness Patient is 47 year old Caucasian married female who was recently seen 2 days ago for the first time came today with her husband with a complain of poor sleep.  Patient did not provide much information, she is a poor historian.  Husband endorse that for past 29 she's not sleeping and moving things at home around.  She pulled clothes from the closet all over the house.  Yesterday she call 911 and asking police to find a husband despite the fact that she knew husband is working.  Husband is a [employer] and patient knows his cellular phone number.  Per husband patient is been more disorganized and decided to taking her clothes off.  When I ask from patient why she did that, she endorse it was fun doing that.  She admitted not slept all night.  Patient denies any active or passive suicidal thoughts or homicidal thoughts but remains very disorganized with erratic behavior.  On February 10, I recommend to reduce Zyprexa from 40-20 mg and started Prolixin which has been believe help in the past.  We also started Depakote and I stop Trileptal.  Patient denies any side effects of medication but I believe reducing Zyprexa may have cause some decompensation.  Patient and her husband insisted that Prolixin and valproic acid works very well for the patient.  Husband does not want to change Prolixin or valproic acid. Patient denies any tremors or shakes at this time limited to provide any details.  She's not drinking or using any illegal substance.  Past psychiatric history Patient has extensive history of psychiatric illness.  Patient has been hospitalized at least 6 times in past 2 years.  She was recently discharged from old [facility] with Zyprexa 40 mg, Trileptal lorazepam Cogentin and Effexor.  Patient has repeated hospitalization either due to noncompliance of medication or disorganized behavior.  She has history of  hearing voices and gets delusional about her pregnancy.  She is a history of taking her clothes off.  As per husband best medication regime was Prolixin, Depakote in the past.  She had tried Risperdal, Haldol, Prolixin, Lamictal, Klonopin and Invega .  There is a history of noncompliance with medication.  There is also a history of suicidal attempt in the past but patient do not remember very well.  No history of ECT treatment.  Psychosocial history Patient was born and raised in [place].  She's been married for 16 years.  Husband is very supportive.  She has no children.  Patient has a very limited contact with her family.  Patient has been sister but they don't talk to her normally.  Family history As per husband patient's grandparents committed suicide.  Patient's brother do drugs.  Alcohol and substance use history Patient admitted history of using drugs in the college but no recent history of drinking or using drugs.  Education background and work history Patient has college education.  She's unemployed.  Medical history Patient has low back pain and chronic headache.  She is obese female.  Filed Vitals:   02/01/13 1032  BP: 109/79  Pulse: 101     Review of Systems  Constitutional: Negative.   Musculoskeletal: Positive for back pain.  Neurological: Negative.   Psychiatric/Behavioral: Positive for hallucinations. The patient is nervous/anxious.    Mental status examination Patient is obese female who is casually dressed and fairly groomed.  She is a poor historian and maintained poor  eye contact.  Her speech is slow and at times rambling and incoherent.  There is a thought blocking in her thought process is tangential at times.  She appears tired and remained disorganized and paranoid.  She has delusion being pregnant.  She endorse people talk about herself but patient was cooperative in conversation.  She denies any active or passive suicidal thoughts or homicidal thoughts.   She endorse sometimes visual hallucinations and auditory hallucination.  Her attention and concentration is poor.  There were mild tremors in her hand.  There is a poverty of thought content.  Her psychomotor activity is slow.  She's oriented x2.  Her insight judgment is fair.  Her impulse control is okay.  Assessment Axis I paranoid schizophrenia chronic Axis II deferred Axis III obesity, chronic back pain and headache Axis IV moderate Axis V 45-50  Plan I review her symptoms,  medication response and recent behavior.  I do believe reducing Zyprexa has caused some decompensation into her illness.  I recommend to take Zyprexa 10 mg 3 tablet at bedtime.  I also recommend to move her morning Prolixin does with her night does.  I will consider increasing her valproic acid on her next visit.  We will also consider Prolixin decannulated on her next visit.  I recommend to call us if she is any question or concern about the medication.  One more time discussed safety plan in detail.  Patient may require too antipsychotic medication for a while until she is more stable.  At this time patient does not have any command auditory hallucination or any active suicidal thoughts or homicidal thoughts.  She does not meet criteria for inpatient psychiatric treatment.  I will see her again in 10 days.  Time spent 30 minutes.

## 2013-02-01 NOTE — ED Provider Notes (Signed)
Medical screening examination/treatment/procedure(s) were performed by non-physician practitioner and as supervising physician I was immediately available for consultation/collaboration.   Gerik Coberly L Samrat Hayward, MD 02/01/13 1957 

## 2013-02-01 NOTE — ED Provider Notes (Signed)
History     CSN: [id number]  Arrival date & time 02/01/13  1428   First MD Initiated Contact with Patient 02/01/13 1521      Chief Complaint  Patient presents with  . Medical Clearance    (Consider location/radiation/quality/duration/timing/severity/associated sxs/prior treatment) HPI Comments: This is a 83 rolled female, past medical history her markable for schizophrenia, and depression, who presents emergency department with chief complaint of worsening paranoia and agitation. Patient is brought in by her husband, who states that she's been extremely paranoid for the past 2 days, after her medications were changed. He states that she has been behaving irregularly, throwing things and clothes around the house, calling 911 at inappropriate times, and stating that she has a baby or is pregnant. Patient is poor historian, and a level V caveat applies in this situation.  The history is provided by the patient. No language interpreter was used.    Past Medical History  Diagnosis Date  . Headache   . Paranoid schizophrenia   . Depression   . Schizophrenia   . Low back pain     History reviewed. No pertinent past surgical history.  Family History  Problem Relation Age of Onset  . Diabetes    . Hypertension    . Breast cancer      History  Substance Use Topics  . Smoking status: Never Smoker   . Smokeless tobacco: Not on file  . Alcohol Use: No    OB History   Grav Para Term Preterm Abortions TAB SAB Ect Mult Living                  Review of Systems  Unable to perform ROS: Psychiatric disorder    Allergies  Divalproex sodium; Risperidone; and Sulfonamide derivatives  Home Medications   Current Outpatient Rx  Name  Route  Sig  Dispense  Refill  . benztropine (COGENTIN) 1 MG tablet   Oral   Take 1 mg by mouth 2 (two) times daily.         . fluPHENAZine (PROLIXIN) 5 MG tablet   Oral   Take 5 mg by mouth 2 (two) times daily.         Marland Kitchen OLANZapine zydis  (ZYPREXA) 10 MG disintegrating tablet   Oral   Take 30 mg by mouth at bedtime.          . valproic acid (DEPAKENE) 250 MG capsule   Oral   Take 250 mg by mouth at bedtime.         Marland Kitchen venlafaxine XR (EFFEXOR-XR) 37.5 MG 24 hr capsule   Oral   Take 37.5 mg by mouth every morning.           BP 131/96  Pulse 107  Temp(Src) 98.7 F (37.1 C) (Oral)  Resp 20  SpO2 95%  Physical Exam  Nursing note and vitals reviewed. Constitutional: She is oriented to person, place, and time. She appears well-developed and well-nourished.  HENT:  Head: Normocephalic and atraumatic.  Eyes: Conjunctivae and EOM are normal. Pupils are equal, round, and reactive to light.  Neck: Normal range of motion. Neck supple.  Cardiovascular: Normal rate and regular rhythm.  Exam reveals no gallop and no friction rub.   No murmur heard. Slightly tachycardic  Pulmonary/Chest: Effort normal and breath sounds normal. No respiratory distress. She has no wheezes. She has no rales. She exhibits no tenderness.  Abdominal: Soft. Bowel sounds are normal. She exhibits no distension and no mass. There  is no tenderness. There is no rebound and no guarding.  Musculoskeletal: Normal range of motion. She exhibits no edema and no tenderness.  Neurological: She is alert and oriented to person, place, and time.  Skin: Skin is warm and dry.  Psychiatric:  Withdrawn    ED Course  Procedures (including critical care time)  Labs Reviewed  CBC WITH DIFFERENTIAL  COMPREHENSIVE METABOLIC PANEL  URINE RAPID DRUG SCREEN (HOSP PERFORMED)  ETHANOL   No results found.  Results for orders placed during the hospital encounter of 02/01/13  CBC WITH DIFFERENTIAL      Result Value Range   WBC 9.0  4.0 - 10.5 K/uL   RBC 4.71  3.87 - 5.11 MIL/uL   Hemoglobin 13.3  12.0 - 15.0 g/dL   HCT 16.1  09.6 - 04.5 %   MCV 85.8  78.0 - 100.0 fL   MCH 28.2  26.0 - 34.0 pg   MCHC 32.9  30.0 - 36.0 g/dL   RDW 40.9  81.1 - 91.4 %    Platelets 350  150 - 400 K/uL   Neutrophils Relative 74  43 - 77 %   Neutro Abs 6.7  1.7 - 7.7 K/uL   Lymphocytes Relative 13  12 - 46 %   Lymphs Abs 1.2  0.7 - 4.0 K/uL   Monocytes Relative 11  3 - 12 %   Monocytes Absolute 1.0  0.1 - 1.0 K/uL   Eosinophils Relative 1  0 - 5 %   Eosinophils Absolute 0.1  0.0 - 0.7 K/uL   Basophils Relative 1  0 - 1 %   Basophils Absolute 0.1  0.0 - 0.1 K/uL  COMPREHENSIVE METABOLIC PANEL      Result Value Range   Sodium 136  135 - 145 mEq/L   Potassium 3.8  3.5 - 5.1 mEq/L   Chloride 101  96 - 112 mEq/L   CO2 23  19 - 32 mEq/L   Glucose, Bld 117 (*) 70 - 99 mg/dL   BUN 9  6 - 23 mg/dL   Creatinine, Ser 7.82  0.50 - 1.10 mg/dL   Calcium 9.6  8.4 - 95.6 mg/dL   Total Protein 7.6  6.0 - 8.3 g/dL   Albumin 4.2  3.5 - 5.2 g/dL   AST 19  0 - 37 U/L   ALT 21  0 - 35 U/L   Alkaline Phosphatase 74  39 - 117 U/L   Total Bilirubin 0.2 (*) 0.3 - 1.2 mg/dL   GFR calc non Af Amer >90  >90 mL/min   GFR calc Af Amer >90  >90 mL/min  ETHANOL      Result Value Range   Alcohol, Ethyl (B) <11  0 - 11 mg/dL  VALPROIC ACID LEVEL      Result Value Range   Valproic Acid Lvl 19.3 (*) 50.0 - 100.0 ug/mL       No diagnosis found.    MDM  This is a 47 year old female with schizophrenia, with worsening paranoia to a recent medication change. I will place psych hold orders, and we'll move the patient to psych ED for further evaluation by the ACT team. Patient medically clear for evaluation by ACT team.         Roxy Horseman, PA-C 02/01/13 1916

## 2013-02-02 LAB — RAPID URINE DRUG SCREEN, HOSP PERFORMED
Benzodiazepines: NOT DETECTED
Cocaine: NOT DETECTED

## 2013-02-02 MED ORDER — ZIPRASIDONE MESYLATE 20 MG IM SOLR: 20.0000 mg | Freq: Two times a day (BID) | INTRAMUSCULAR | Status: DC | PRN

## 2013-02-02 MED ORDER — LORAZEPAM 1 MG PO TABS
2.0000 mg | ORAL_TABLET | Freq: Four times a day (QID) | ORAL | Status: DC | PRN
  Administered 2013-02-03: 2 mg via ORAL
  Filled 2013-02-02 (×2): qty 2

## 2013-02-02 MED ORDER — LORAZEPAM 2 MG/ML IJ SOLN: 2.0000 mg | Freq: Four times a day (QID) | INTRAMUSCULAR | Status: DC | PRN

## 2013-02-02 MED ORDER — ZIPRASIDONE MESYLATE 20 MG IM SOLR
10.0000 mg | Freq: Once | INTRAMUSCULAR | Status: AC
  Administered 2013-02-02: 10 mg via INTRAMUSCULAR
  Filled 2013-02-02: qty 20

## 2013-02-02 NOTE — BH Assessment (Signed)
Assessment Note   Charlotte [name] is a 47 y.o. female presenting with worsening paranoia and recent medication change.  Pt accompanied by spouse.  This writer attempted to assess pt, however she was drowsy and a poor historian.  Pt briefly commented that she did not know why she was here and stated that she was feeling better since she'd had a chance to rest.  The following information is collateral, gathered from psychiatrist's and spouse's observations.  Pt is pending telepsych when she is more coherent for final disposition.  Pt met with Dr. Kathryne Sharper on 01/30/13 after her recent d/c from [facility] because of a psychotic episode.  Pt was previously a pt of Dr. Betti Cruz but is no longer able to continue with his services due to insurance changes.    Pt has a long psych history and with recent hospitalization and medication change(01/30/13), pt has been exhibiting erratic and paranoid behavior.  Pt is angry, agitated, has mood swings and is delusional.  Pt is sleeping more and has less interaction with others. In the past 2 days, pt has been throwing objects and clothing around the home, mis-using 911, stating that she is pregnant or has baby.  Pt was able to deny any issues with SI/HI to this Clinical research associate.  Pt is not able to manage medications on her own.   Currently, pt is not endorsing any AVH.    Axis I: Schizoohrenia, paranoid type  Axis II: Deferred Axis III:  Past Medical History  Diagnosis Date  . Headache   . Paranoid schizophrenia   . Depression   . Schizophrenia   . Low back pain    Axis IV: other psychosocial or environmental problems, problems related to social environment, problems with access to health care services and problems with primary support group Axis V: 21-30 behavior considerably influenced by delusions or hallucinations OR serious impairment in judgment, communication OR inability to function in almost all areas  Past Medical History:  Past Medical History  Diagnosis  Date  . Headache   . Paranoid schizophrenia   . Depression   . Schizophrenia   . Low back pain     History reviewed. No pertinent past surgical history.  Family History:  Family History  Problem Relation Age of Onset  . Diabetes    . Hypertension    . Breast cancer      Social History:  reports that she has never smoked. She does not have any smokeless tobacco history on file. She reports that she does not drink alcohol or use illicit drugs.  Additional Social History:  Alcohol / Drug Use Pain Medications: See MAR  Prescriptions: See MAR Over the Counter: See MAR History of alcohol / drug use?: No history of alcohol / drug abuse Longest period of sobriety (when/how long): None   CIWA: CIWA-Ar BP: 114/77 mmHg Pulse Rate: 85 COWS:    Allergies:  Allergies  Allergen Reactions  . Divalproex Sodium     Causing tremors  . Risperidone Itching    Causes altered mental status  . Sulfonamide Derivatives     REACTION: Eye irritation    Home Medications:  (Not in a hospital admission)  OB/GYN Status:  No LMP recorded.  General Assessment Data Location of Assessment: [facility] Living Arrangements: Spouse/significant other Can pt return to current living arrangement?: Yes Admission Status: Voluntary Is patient capable of signing voluntary admission?: Yes Transfer from: Acute Hospital Referral Source: [facility]  Education Status Is patient currently in  school?: No Current Grade: None  Highest grade of school patient has completed: Some [employer] Name of school: None  Contact person: None   Risk to self Suicidal Ideation: No Suicidal Intent: No Is patient at risk for suicide?: No Suicidal Plan?: No Access to Means: No What has been your use of drugs/alcohol within the last 12 months?: Pt denies chronic use  Previous Attempts/Gestures: Yes How many times?: 14 Other Self Harm Risks: None  Triggers for Past Attempts: Unpredictable Intentional Self Injurious  Behavior: None Family Suicide History: No Recent stressful life event(s): Other (Comment) (Recent medication change ) Persecutory voices/beliefs?: No Depression: No Depression Symptoms:  (None Reported ) Substance abuse history and/or treatment for substance abuse?: Yes Suicide prevention information given to non-admitted patients: Not applicable  Risk to Others Homicidal Ideation: No Thoughts of Harm to Others: No Current Homicidal Intent: No Current Homicidal Plan: No Access to Homicidal Means: No Identified Victim: None  History of harm to others?: No Assessment of Violence: None Noted Violent Behavior Description: None  Does patient have access to weapons?: No Criminal Charges Pending?: No Does patient have a court date: No  Psychosis Hallucinations: None noted Delusions: Unspecified  Mental Status Report Appear/Hygiene: Other (Comment) (Appropriate) Eye Contact: Poor Motor Activity: Unremarkable Speech: Unable to assess Level of Consciousness: Drowsy;Sleeping Mood: Other (Comment) (UTA) Affect: Unable to Assess Anxiety Level: None Thought Processes: Relevant Judgement: Unimpaired Orientation: Person;Place Obsessive Compulsive Thoughts/Behaviors: None  Cognitive Functioning Concentration: Normal Memory: Recent Intact;Remote Intact IQ: Average Insight: Fair Impulse Control: Fair Appetite: Good Weight Loss: 0 Weight Gain: 0 Sleep: No Change Total Hours of Sleep: 6 Vegetative Symptoms: None  ADLScreening [facility] Assessment Services) Patient's cognitive ability adequate to safely complete daily activities?: Yes Patient able to express need for assistance with ADLs?: Yes Independently performs ADLs?: Yes (appropriate for developmental age)  Abuse/Neglect [facility]) Physical Abuse: Denies Verbal Abuse: Denies Sexual Abuse: Denies  Prior Inpatient Therapy Prior Inpatient Therapy: Yes Prior Therapy Dates: 2002,2005,2014 Prior Therapy Facilty/Provider(s): [facility],  [facility]  Reason for Treatment: SI/Psych   Prior Outpatient Therapy Prior Outpatient Therapy: Yes Prior Therapy Dates: Current  Prior Therapy Facilty/Provider(s): Dr. Kathryne Sharper  Reason for Treatment: Psychosis/SI   ADL Screening (condition at time of admission) Patient's cognitive ability adequate to safely complete daily activities?: Yes Patient able to express need for assistance with ADLs?: Yes Independently performs ADLs?: Yes (appropriate for developmental age) Weakness of Legs: None Weakness of Arms/Hands: None  Home Assistive Devices/Equipment Home Assistive Devices/Equipment: None  Therapy Consults (therapy consults require a physician order) PT Evaluation Needed: No OT Evalulation Needed: No SLP Evaluation Needed: No Abuse/Neglect Assessment (Assessment to be complete while patient is alone) Physical Abuse: Denies Verbal Abuse: Denies Sexual Abuse: Denies Exploitation of patient/patient's resources: Denies Self-Neglect: Denies Values / Beliefs Cultural Requests During Hospitalization: None Spiritual Requests During Hospitalization: None Consults Spiritual Care Consult Needed: No Social Work Consult Needed: No Merchant navy officer (For Healthcare) Advance Directive: Patient does not have advance directive;Patient would not like information Nutrition Screen- [facility] Adult/[facility] Patient's home diet: Regular Have you recently lost weight without trying?: No Have you been eating poorly because of a decreased appetite?: No Malnutrition Screening Tool Score: 0  Additional Information 1:1 In Past 12 Months?: No CIRT Risk: No Elopement Risk: No Does patient have medical clearance?: Yes     Disposition:  Disposition Disposition of Patient: Referred to (Telepsych ) Type of outpatient treatment: Adult Patient referred to: Other (Comment) (Telepsych )  On Site Evaluation by:  Reviewed with Physician:     Murrell Redden 02/02/2013 7:24 AM

## 2013-02-02 NOTE — [facility] Counselor (Signed)
Patient has been accepted at [facility] by Dr. Jannifer Franklin pending bed availabilty.

## 2013-02-02 NOTE — ED Provider Notes (Signed)
Filed Vitals:   02/02/13 1805  BP: 133/89  Pulse: 102  Temp: 98.6 F (37 C)  Resp: 18   Telepsych consult performed as prior one was done when pt was too drowsy and couldn't participate.  Recommendations per Dr. Jacky Kindle reports:  inpt admission geodon 20 mg q 12 hrs prn agitation Ativan 2 mg po/IM q 6 hours prn anxiety  Charlotte [name]. Oletta Lamas, MD 02/02/13 2219

## 2013-02-03 ENCOUNTER — Inpatient Hospital Stay (HOSPITAL_COMMUNITY)
Admission: AD | Admit: 2013-02-03 | Discharge: 2013-02-23 | DRG: 885 | Disposition: A | Discharge status: Home / Self Care | Payer: 59 | Source: Intra-hospital | Attending: Psychiatry | Admitting: Psychiatry

## 2013-02-03 ENCOUNTER — Encounter (HOSPITAL_COMMUNITY): Payer: Self-pay

## 2013-02-03 DIAGNOSIS — R51 Headache: Secondary | ICD-10-CM

## 2013-02-03 DIAGNOSIS — M545 Low back pain, unspecified: Secondary | ICD-10-CM

## 2013-02-03 DIAGNOSIS — Z79899 Other long term (current) drug therapy: Secondary | ICD-10-CM

## 2013-02-03 DIAGNOSIS — F32A Depression, unspecified: Secondary | ICD-10-CM

## 2013-02-03 DIAGNOSIS — F250 Schizoaffective disorder, bipolar type: Secondary | ICD-10-CM | POA: Diagnosis present

## 2013-02-03 DIAGNOSIS — B372 Candidiasis of skin and nail: Secondary | ICD-10-CM

## 2013-02-03 DIAGNOSIS — F329 Major depressive disorder, single episode, unspecified: Secondary | ICD-10-CM

## 2013-02-03 DIAGNOSIS — F200 Paranoid schizophrenia: Secondary | ICD-10-CM

## 2013-02-03 DIAGNOSIS — F259 Schizoaffective disorder, unspecified: Principal | ICD-10-CM | POA: Diagnosis present

## 2013-02-03 MED ORDER — BENZTROPINE MESYLATE 1 MG PO TABS
1.0000 mg | ORAL_TABLET | Freq: Two times a day (BID) | ORAL | Status: DC
  Administered 2013-02-03: 1 mg via ORAL
  Filled 2013-02-03: qty 1

## 2013-02-03 MED ORDER — DIVALPROEX SODIUM 250 MG PO DR TAB
250.0000 mg | DELAYED_RELEASE_TABLET | Freq: Two times a day (BID) | ORAL | Status: DC
  Administered 2013-02-03: 250 mg via ORAL
  Filled 2013-02-03: qty 1

## 2013-02-03 MED ORDER — ACETAMINOPHEN 325 MG PO TABS: 650.0000 mg | ORAL_TABLET | Freq: Four times a day (QID) | ORAL | Status: DC | PRN

## 2013-02-03 MED ORDER — OLANZAPINE 10 MG PO TBDP: 20.0000 mg | ORAL_TABLET | Freq: Every day | ORAL | Status: DC

## 2013-02-03 MED ORDER — HYDROXYZINE HCL 25 MG PO TABS
25.0000 mg | ORAL_TABLET | ORAL | Status: DC | PRN
  Administered 2013-02-04 – 2013-02-15 (×8): 25 mg via ORAL

## 2013-02-03 MED ORDER — FLUPHENAZINE HCL 10 MG PO TABS
10.0000 mg | ORAL_TABLET | Freq: Every day | ORAL | Status: DC
  Filled 2013-02-03: qty 1

## 2013-02-03 MED ORDER — MAGNESIUM HYDROXIDE 400 MG/5ML PO SUSP: 30.0000 mL | Freq: Every day | ORAL | Status: DC | PRN

## 2013-02-03 MED ORDER — ALUM & MAG HYDROXIDE-SIMETH 200-200-20 MG/5ML PO SUSP: 30.0000 mL | ORAL | Status: DC | PRN

## 2013-02-03 NOTE — Progress Notes (Signed)
Pt accepted to [facility] [address]. CSW will complete patient support paperwork. No precert required for patient insurance. MD and RN informed.   Catha Gosselin, LCSWA  [phone] .02/03/2013 1717pm

## 2013-02-03 NOTE — ED Provider Notes (Signed)
Patient awake and standing at door. Telepsych has recommended inpatient treatment. Med recs previously ordered. Awaiting placment.   Charlotte [name]. [name], MD 02/03/13 [phone]

## 2013-02-03 NOTE — Progress Notes (Signed)
Pt received lunch tray 

## 2013-02-03 NOTE — Progress Notes (Addendum)
Patient ID: Charlotte [name], female   DOB: [birth year], 48 y.o.   MRN: [id number]  Admission Note:  D:46 yr female who presents [facility] in no acute distress for the treatment of Medication management and Psychosis. Pt appears anxious, flat and depressed. Pt was calm and cooperative with admission process. Pt denies SI and AVH .Pt has Past medical Hx of chronic back pain, and H/A. Pt states she changed her medications recently, and began behaving bizarre. Pt states she does not remeber anything that happened after going through doors of ED, she just remembers waking up in the ED bed. Pt states her husband regulates her medications, so she is not sure what she has taken or what she was taking. Pt says she goes to [facility] OP. Pt had no extra clothes or belongings.    A:Skin was assessed and found to be clear of any abnormal marks apart from a scar  On R-forearm, small circular mark on R-knee area, cracks on the heels of feet. POC and unit policies explained and understanding verbalized. Consents obtained. Food and fluids offered.  safety has been maintained with Q15 minutes observation. Support and encouragement provided  Scheduled medication given.   R: Pt had no additional questions or concerns. Patient remains safe.

## 2013-02-03 NOTE — ED Notes (Signed)
D Charlotte [name] remains paranoid, rambles in her speech and has  disorganized thinking She has been UAL on the unit today...tolerated fair. She takes her meds as scehduled by the MD. [facility] contracts  For safety, denies HI, but is obviously responding to auditory hallucinations, overheard having conversations with herself. She became very agitated and paranoid, pacing around the unit, hyperventilating and becoming quite agitated. She allowed staff to sit with her , she took prn dose of ativan 2mg  po and has responded well to this medications. Report called to [facility] charge nurse and will await [facility]'s direction on when to transport pt to [facility], for admission.

## 2013-02-03 NOTE — Tx Team (Signed)
Initial Interdisciplinary Treatment Plan  PATIENT STRENGTHS: (choose at least two) Ability for insight General fund of knowledge  PATIENT STRESSORS: Financial difficulties Health problems Marital or family conflict   PROBLEM LIST: Problem List/Patient Goals Date to be addressed Date deferred Reason deferred Estimated date of resolution  Medication Management      Mood Swings                                                 DISCHARGE CRITERIA:  Ability to meet basic life and health needs Improved stabilization in mood, thinking, and/or behavior Medical problems require only outpatient monitoring  PRELIMINARY DISCHARGE PLAN: Attend aftercare/continuing care group Outpatient therapy  PATIENT/FAMIILY INVOLVEMENT: This treatment plan has been presented to and reviewed with the patient, Charlotte [name].  The patient and family have been given the opportunity to ask questions and make suggestions.  Jacques Navy A 02/03/2013, 10:22 PM

## 2013-02-03 NOTE — Consult Note (Signed)
Reason for Consult: Paranoid schizophrenia with acute exacerbation and recent medication changes Referring Physician: [name] is an 47 y.o. female.  HPI: Patient was known to this provider from her previous Charlotte [name] visits. [facility] Patient was known chronic schizophrenia and has a poor historian. Patient has been more talkative but loosening of associations and flight of ideations to get patient was recently the chest from the or linear for a similar psychotic episode. [facility] psychiatric [name]. Patient spouse is supple to her.  MSE: Patient appeared standing in her room and unable to relax, talkative but poor historian. Patient appeared anxious worried paranoid. Reportedly patient has mood swings and sleep difficulties patient was found throwing objects and closthigs on the home as a rapid behavior. Patient does not endorse symptoms of suicidal ideation or homicidal ideation. Patient was found with inappropriate laughing and also talking about seeing "radio flowers wagon convertible". Patient denied command hallucinations.  Past Medical History  Diagnosis Date  . Headache   . Paranoid schizophrenia   . Depression   . Schizophrenia   . Low back pain     History reviewed. No pertinent past surgical history.  Family History  Problem Relation Age of Onset  . Diabetes    . Hypertension    . Breast cancer      Social History:  reports that she has never smoked. She does not have any smokeless tobacco history on file. She reports that she does not drink alcohol or use illicit drugs.  Allergies:  Allergies  Allergen Reactions  . Divalproex Sodium     Causing tremors  . Risperidone Itching    Causes altered mental status  . Sulfonamide Derivatives     REACTION: Eye irritation     Medications: I have reviewed the patient's current medications.  Results for orders placed during the hospital encounter of 02/01/13 (from the past 48 hour(s))  CBC WITH DIFFERENTIAL     Status: None   Collection Time    02/01/13  3:00 PM      Result Value Range   WBC 9.0  4.0 - 10.5 K/uL   RBC 4.71  3.87 - 5.11 MIL/uL   Hemoglobin 13.3  12.0 - 15.0 g/dL   HCT 16.1  09.6 - 04.5 %   MCV 85.8  78.0 - 100.0 fL   MCH 28.2  26.0 - 34.0 pg   MCHC 32.9  30.0 - 36.0 g/dL   RDW 40.9  81.1 - 91.4 %   Platelets 350  150 - 400 K/uL   Neutrophils Relative 74  43 - 77 %   Neutro Abs 6.7  1.7 - 7.7 K/uL   Lymphocytes Relative 13  12 - 46 %   Lymphs Abs 1.2  0.7 - 4.0 K/uL   Monocytes Relative 11  3 - 12 %   Monocytes Absolute 1.0  0.1 - 1.0 K/uL   Eosinophils Relative 1  0 - 5 %   Eosinophils Absolute 0.1  0.0 - 0.7 K/uL   Basophils Relative 1  0 - 1 %   Basophils Absolute 0.1  0.0 - 0.1 K/uL  COMPREHENSIVE METABOLIC PANEL     Status: Abnormal   Collection Time    02/01/13  3:00 PM      Result Value Range   Sodium 136  135 - 145 mEq/L  Potassium 3.8  3.5 - 5.1 mEq/L   Chloride 101  96 - 112 mEq/L   CO2 23  19 - 32 mEq/L   Glucose, Bld 117 (*) 70 - 99 mg/dL   BUN 9  6 - 23 mg/dL   Creatinine, Ser 0.45  0.50 - 1.10 mg/dL   Calcium 9.6  8.4 - 40.9 mg/dL   Total Protein 7.6  6.0 - 8.3 g/dL   Albumin 4.2  3.5 - 5.2 g/dL   AST 19  0 - 37 U/L   ALT 21  0 - 35 U/L   Alkaline Phosphatase 74  39 - 117 U/L   Total Bilirubin 0.2 (*) 0.3 - 1.2 mg/dL   GFR calc non Af Amer >90  >90 mL/min   GFR calc Af Amer >90  >90 mL/min   Comment:            The eGFR has been calculated     using the CKD EPI equation.     This calculation has not been     validated in all clinical     situations.     eGFR's persistently     <90 mL/min signify     possible Chronic Kidney Disease.  Charlotte [name]     Status: None   Collection Time    02/01/13  3:00 PM      Result Value Range   Alcohol, Ethyl (B) <11  0  - 11 mg/dL   Comment:            LOWEST DETECTABLE LIMIT FOR     SERUM ALCOHOL IS 11 mg/dL     FOR MEDICAL PURPOSES ONLY  VALPROIC ACID LEVEL     Status: Abnormal   Collection Time    02/01/13  3:00 PM      Result Value Range   Valproic Acid Lvl 19.3 (*) 50.0 - 100.0 ug/mL  URINE RAPID DRUG SCREEN (HOSP PERFORMED)     Status: None   Collection Time    02/02/13  4:15 PM      Result Value Range   Opiates NONE DETECTED  NONE DETECTED   Cocaine NONE DETECTED  NONE DETECTED   Benzodiazepines NONE DETECTED  NONE DETECTED   Amphetamines NONE DETECTED  NONE DETECTED   Tetrahydrocannabinol NONE DETECTED  NONE DETECTED   Barbiturates NONE DETECTED  NONE DETECTED   Comment:            DRUG SCREEN FOR MEDICAL PURPOSES     ONLY.  IF CONFIRMATION IS NEEDED     FOR ANY PURPOSE, NOTIFY LAB     WITHIN 5 DAYS.                LOWEST DETECTABLE LIMITS     FOR URINE DRUG SCREEN     Drug Class       Cutoff (ng/mL)     Amphetamine      1000     Barbiturate      200     Benzodiazepine   200     Tricyclics       300     Opiates          300     Cocaine          300     THC              50    No results found.  Positive for anxiety, behavior problems, mood swings and sleep disturbance Blood  pressure 131/89, pulse 85, temperature 98.3 F (36.8 C), temperature source Oral, resp. rate 18, SpO2 98.00%.   Assessment/Plan: Schizophrenia, paranoid  Recommendation patient needed acute psychiatric hospitalization for crisis stabilization and medication management and safety. Patient will start her home medication while in the emergency department.  Charlotte [name],Charlotte R. 02/03/2013, 12:08 PM

## 2013-02-03 NOTE — Progress Notes (Signed)
CSW spoke with Ala Dach at [facility] in Assessment, regarding patient referral status. Pt accepted to [facility] pending bed, possibly for later today.   Catha Gosselin, LCSWA  [phone] 02/03/2013 1704pm

## 2013-02-03 NOTE — ED Notes (Signed)
Received MD order to send pt to [facility] for acute in patient admission. Verbal report called to [facility] RN [facility] and received okay to send pt via security to [facility] at 1845 pt was transported to [facility], alert and oriented x2.

## 2013-02-04 MED ORDER — OLANZAPINE 10 MG IM SOLR
10.0000 mg | Freq: Once | INTRAMUSCULAR | Status: AC
  Administered 2013-02-04: 10 mg via INTRAMUSCULAR
  Filled 2013-02-04: qty 10

## 2013-02-04 MED ORDER — OLANZAPINE 10 MG PO TABS: 20.0000 mg | ORAL_TABLET | Freq: Every day | ORAL | Status: DC

## 2013-02-04 MED ORDER — VENLAFAXINE HCL ER 37.5 MG PO CP24
37.5000 mg | ORAL_CAPSULE | Freq: Every morning | ORAL | Status: DC
  Administered 2013-02-04 – 2013-02-06 (×3): 37.5 mg via ORAL
  Filled 2013-02-04 (×5): qty 1

## 2013-02-04 MED ORDER — OLANZAPINE 10 MG PO TABS
20.0000 mg | ORAL_TABLET | Freq: Once | ORAL | Status: AC
  Administered 2013-02-05: 20 mg via ORAL
  Filled 2013-02-04: qty 2

## 2013-02-04 MED ORDER — OLANZAPINE 10 MG IM SOLR
INTRAMUSCULAR | Status: AC
  Administered 2013-02-04: 10 mg via INTRAMUSCULAR
  Filled 2013-02-04: qty 10

## 2013-02-04 MED ORDER — FLUPHENAZINE HCL 5 MG PO TABS
10.0000 mg | ORAL_TABLET | Freq: Every day | ORAL | Status: DC
  Administered 2013-02-04 – 2013-02-06 (×3): 10 mg via ORAL
  Filled 2013-02-04 (×5): qty 2

## 2013-02-04 MED ORDER — VALPROIC ACID 250 MG PO CAPS
250.0000 mg | ORAL_CAPSULE | Freq: Every day | ORAL | Status: DC
  Administered 2013-02-04 – 2013-02-16 (×13): 250 mg via ORAL
  Filled 2013-02-04 (×15): qty 1

## 2013-02-04 MED ORDER — BENZTROPINE MESYLATE 1 MG PO TABS
1.0000 mg | ORAL_TABLET | Freq: Two times a day (BID) | ORAL | Status: DC
  Administered 2013-02-04 – 2013-02-21 (×33): 1 mg via ORAL
  Filled 2013-02-04 (×42): qty 1

## 2013-02-04 MED ORDER — OLANZAPINE 10 MG PO TBDP
ORAL_TABLET | ORAL | Status: AC
  Filled 2013-02-04: qty 1

## 2013-02-04 MED ORDER — OLANZAPINE 10 MG PO TBDP: 30.0000 mg | ORAL_TABLET | Freq: Every day | ORAL | Status: DC

## 2013-02-04 MED ORDER — OLANZAPINE 10 MG PO TBDP
30.0000 mg | ORAL_TABLET | Freq: Every day | ORAL | Status: DC
  Filled 2013-02-04 (×2): qty 3

## 2013-02-04 NOTE — H&P (Signed)
I have examined there patient and agree with the findings. I certify that inpatient services furnished can reasonably be expected to improve the patient's condition. Temitayo Covalt, MD, MSPH  

## 2013-02-04 NOTE — Progress Notes (Signed)
Patient ID: Charlotte [name], female   DOB: September 05, 1966, 47 y.o.   MRN: [id number] 02-04-13 @ 1625 nursing shift note: D: pt became extremely anxious, agitated and hyper religious around 1330. She couldn't follow directions. A: sat with the pt in the quiet room and deescalated her. Gave her a Zyprexa 10 mg injection and also administered venlafaxine XR 37.5 per new order. Called her husband and got an order so that he could visit the pt outside of visiting hours. R: the husband came to visit. the combination of these interventions helped the pt regain control of her emotions. RN will monitor and Q 15 min ck's continue.

## 2013-02-04 NOTE — Progress Notes (Signed)
Patient ID: Charlotte [name], female   DOB: 07/03/1966, 47 y.o.   MRN: [id number] D)   Has been out in the dayroom, was reading her Bible, went over to a female peer she was calling by her husband's name and was kneeling before him , holding his hands and was praying he would forgive her.  Had tried to kiss this female peer earlier, has needed frequent redirection and reorientation. Has been in the dayroom most of the evening, more internally focused and whispering in response to internal stimuli.  Initially agreed to take hs meds, changed her mind about zyprexa.   Staring and not responding when encouraged to take her meds and stated she didn't need them. A)  Will continue to monitor and encourage meds R) Remains psychotic, distant,  Safety maintained.

## 2013-02-04 NOTE — Clinical Social Work Note (Signed)
[facility] Group Notes: (Clinical Social Work)   02/04/2013      Type of Therapy:  Group Therapy   Participation Level:  Did Not Attend    Ambrose Mantle, LCSW 02/04/2013, 1:33 PM

## 2013-02-04 NOTE — H&P (Signed)
Psychiatric Admission Assessment Adult  Patient Identification:  Charlotte [name] Date of Evaluation:  02/04/2013 Chief Complaint:  Schizophrenia, undifferentiated type 295.90 History of Present Illness:Patient is brought to the ED by her husband who has IVC'd her due to erratic behaviors for the last several days. She has long history of mental illness with multiple admissions. She is currently being followed in the out patient practice by Dr. Lolly Mustache, and the husband reports a recent medication change. Elements:  Location:  Adult in patient admission. Quality:  chronic. Severity:  severe. Timing:  on going. Duration:  2-3 days this episode.. Context:  all facets of her life.. Associated Signs/Synptoms: Depression Symptoms:  Patient is non-verbal and refuses to answer questions. (Hypo) Manic Symptoms:  Hallucinations, clearly responding to internal stimuli Anxiety Symptoms:   Psychotic Symptoms:  Hallucinations: Auditory PTSD Symptoms: unclear at this time  Psychiatric Specialty Exam: Physical Exam agree with the Exam completed in the ED.   ROS unable to complete due to patient's acuity  Blood pressure 115/81, pulse 93, temperature 98.4 F (36.9 C), temperature source Oral, resp. rate 16, height 5\' 5"  (1.651 m), weight 81.647 kg (180 lb).Body mass index is 29.95 kg/(m^2).  General Appearance: Disheveled  Eye Contact::  Poor  Speech:  Pressured and rambling  Volume:  Normal  Mood:  Anxious  Affect:  Labile  Thought Process:  Disorganized  Orientation:  Full (Time, Place, and Person)  Thought Content:  Hallucinations: Auditory  Suicidal Thoughts:    Homicidal Thoughts:    Memory:    Judgement:  Impaired  Insight:  Lacking  Psychomotor Activity:  Normal  Concentration:  Poor  Recall:  Poor  Akathisia:  No  Handed:    AIMS (if indicated):     Assets:  Social Support  Sleep:  Number of Hours: 6.75    Past Psychiatric History: Diagnosis:Schizoaffective disorder, Bipolar  disorder most recent episode severe with psychosis  Hospitalizations: 2005 at [facility]  Outpatient Care: [facility] Lolly Mustache [facility]  Substance Abuse Care:  Self-Mutilation:  Suicidal Attempts:  Violent Behaviors:   Past Medical History:   Past Medical History  Diagnosis Date  . Headache   . Paranoid schizophrenia   . Depression   . Schizophrenia   . Low back pain    None. Allergies:   Allergies  Allergen Reactions  . Divalproex Sodium     Causing tremors  . Risperidone Itching    Causes altered mental status  . Sulfonamide Derivatives     REACTION: Eye irritation   PTA Medications: Prescriptions prior to admission  Medication Sig Dispense Refill  . fluPHENAZine (PROLIXIN) 5 MG tablet Take 5 mg by mouth 2 (two) times daily.      Marland Kitchen OLANZapine zydis (ZYPREXA) 10 MG disintegrating tablet Take 30 mg by mouth at bedtime.       . valproic acid (DEPAKENE) 250 MG capsule Take 250 mg by mouth at bedtime.      Marland Kitchen venlafaxine XR (EFFEXOR-XR) 37.5 MG 24 hr capsule Take 37.5 mg by mouth every morning.      . benztropine (COGENTIN) 1 MG tablet Take 1 mg by mouth 2 (two) times daily.        Previous Psychotropic Medications:  Medication/Dose                 Substance Abuse History in the last 12 months:  no  Consequences of Substance Abuse: NA  Social History:  reports that she has quit smoking. She does not have any smokeless  tobacco history on file. She reports that she does not drink alcohol or use illicit drugs. Additional Social History: Current Place of Residence:   Place of Birth:   Family Members: Marital Status:  Married Children:  Sons:  Daughters: Relationships: Education:  unknown Educational Problems/Performance: Religious Beliefs/Practices: History of Abuse (Emotional/Phsycial/Sexual) Teacher, music History:  None. Legal History: Hobbies/Interests:  Family History:   Family History  Problem Relation Age of Onset  . Diabetes    .  Hypertension    . Breast cancer      Results for orders placed during the hospital encounter of 02/01/13 (from the past 72 hour(s))  CBC WITH DIFFERENTIAL     Status: None   Collection Time    02/01/13  3:00 PM      Result Value Range   WBC 9.0  4.0 - 10.5 K/uL   RBC 4.71  3.87 - 5.11 MIL/uL   Hemoglobin 13.3  12.0 - 15.0 g/dL   HCT 40.9  81.1 - 91.4 %   MCV 85.8  78.0 - 100.0 fL   MCH 28.2  26.0 - 34.0 pg   MCHC 32.9  30.0 - 36.0 g/dL   RDW 78.2  95.6 - 21.3 %   Platelets 350  150 - 400 K/uL   Neutrophils Relative 74  43 - 77 %   Neutro Abs 6.7  1.7 - 7.7 K/uL   Lymphocytes Relative 13  12 - 46 %   Lymphs Abs 1.2  0.7 - 4.0 K/uL   Monocytes Relative 11  3 - 12 %   Monocytes Absolute 1.0  0.1 - 1.0 K/uL   Eosinophils Relative 1  0 - 5 %   Eosinophils Absolute 0.1  0.0 - 0.7 K/uL   Basophils Relative 1  0 - 1 %   Basophils Absolute 0.1  0.0 - 0.1 K/uL  COMPREHENSIVE METABOLIC PANEL     Status: Abnormal   Collection Time    02/01/13  3:00 PM      Result Value Range   Sodium 136  135 - 145 mEq/L   Potassium 3.8  3.5 - 5.1 mEq/L   Chloride 101  96 - 112 mEq/L   CO2 23  19 - 32 mEq/L   Glucose, Bld 117 (*) 70 - 99 mg/dL   BUN 9  6 - 23 mg/dL   Creatinine, Ser 0.86  0.50 - 1.10 mg/dL   Calcium 9.6  8.4 - 57.8 mg/dL   Total Protein 7.6  6.0 - 8.3 g/dL   Albumin 4.2  3.5 - 5.2 g/dL   AST 19  0 - 37 U/L   ALT 21  0 - 35 U/L   Alkaline Phosphatase 74  39 - 117 U/L   Total Bilirubin 0.2 (*) 0.3 - 1.2 mg/dL   GFR calc non Af Amer >90  >90 mL/min   GFR calc Af Amer >90  >90 mL/min   Comment:            The eGFR has been calculated     using the CKD EPI equation.     This calculation has not been     validated in all clinical     situations.     eGFR's persistently     <90 mL/min signify     possible Chronic Kidney Disease.  ETHANOL     Status: None   Collection Time    02/01/13  3:00 PM      Result Value Range  Alcohol, Ethyl (B) <11  0 - 11 mg/dL   Comment:             LOWEST DETECTABLE LIMIT FOR     SERUM ALCOHOL IS 11 mg/dL     FOR MEDICAL PURPOSES ONLY  VALPROIC ACID LEVEL     Status: Abnormal   Collection Time    02/01/13  3:00 PM      Result Value Range   Valproic Acid Lvl 19.3 (*) 50.0 - 100.0 ug/mL  URINE RAPID DRUG SCREEN (HOSP PERFORMED)     Status: None   Collection Time    02/02/13  4:15 PM      Result Value Range   Opiates NONE DETECTED  NONE DETECTED   Cocaine NONE DETECTED  NONE DETECTED   Benzodiazepines NONE DETECTED  NONE DETECTED   Amphetamines NONE DETECTED  NONE DETECTED   Tetrahydrocannabinol NONE DETECTED  NONE DETECTED   Barbiturates NONE DETECTED  NONE DETECTED   Comment:            DRUG SCREEN FOR MEDICAL PURPOSES     ONLY.  IF CONFIRMATION IS NEEDED     FOR ANY PURPOSE, NOTIFY LAB     WITHIN 5 DAYS.                LOWEST DETECTABLE LIMITS     FOR URINE DRUG SCREEN     Drug Class       Cutoff (ng/mL)     Amphetamine      1000     Barbiturate      200     Benzodiazepine   200     Tricyclics       300     Opiates          300     Cocaine          300     THC              50   Psychological Evaluations:  Assessment:   AXIS I:  Schizoaffective disorder severe recurrent with psychotic features AXIS II:  Deferred AXIS III:   Past Medical History  Diagnosis Date  . Headache   . Paranoid schizophrenia   . Depression   . Schizophrenia   . Low back pain    AXIS IV:  problems with primary support group AXIS V:  51-60 moderate symptoms  Treatment Plan/Recommendations: 1. Admit for crisis management and stabilization. 2. Medication management to reduce current symptoms to base line and improve the patient's overall       level of functioning 3. Treat health problems as indicated. 4. Develop treatment plan to decrease risk of relapse upon discharge and the need for readmission. 5. Psycho-social education regarding relapse prevention and self care. 6. Health care follow up as needed for medical problems. 7.  Restart home medications where appropriate. 8. Supportive psychtherapeutic care in safe environment.  Treatment Plan Summary: Daily contact with patient to assess and evaluate symptoms and progress in treatment Medication management Current Medications:  Current Facility-Administered Medications  Medication Dose Route Frequency Provider Last Rate Last Dose  . acetaminophen (TYLENOL) tablet 650 mg  650 mg Oral Q6H PRN Sanjuana Kava, NP      . alum & mag hydroxide-simeth (MAALOX/MYLANTA) 200-200-20 MG/5ML suspension 30 mL  30 mL Oral Q4H PRN Sanjuana Kava, NP      . hydrOXYzine (ATARAX/VISTARIL) tablet 25 mg  25 mg Oral Q4H PRN Sanjuana Kava, NP  25 mg at 02/04/13 0750  . magnesium hydroxide (MILK OF MAGNESIA) suspension 30 mL  30 mL Oral Daily PRN Sanjuana Kava, NP      . OLANZapine (ZYPREXA) 10 MG injection           . OLANZapine (ZYPREXA) injection 10 mg  10 mg Intramuscular Once Mike Craze, MD      . OLANZapine zydis (ZYPREXA) 10 MG disintegrating tablet             Observation Level/Precautions:  routine  Laboratory:    Psychotherapy:    Medications:     Consultations:    Discharge Concerns:    Estimated LOS:  Other:     I certify that inpatient services furnished can reasonably be expected to improve the patient's condition.    Rona Ravens. Xara Paulding RPAC 2/15/20141:39 PM

## 2013-02-04 NOTE — Progress Notes (Signed)
Adult Psychoeducational Group Note  Date:  02/04/2013 Time:  10:20  Group Topic/Focus:  Healthy Coping Skills  Participation Level:  Did Not Attend  Participation Quality:    Affect:   Cognitive:    Insight:   Engagement in Group:    Modes of Intervention:    Additional Comments:   Rabon Scholle Sean 02/04/2013, 10:40 AM 

## 2013-02-04 NOTE — Progress Notes (Signed)
Patient ID: Charlotte [name], female   DOB: 1966-07-12, 47 y.o.   MRN: [id number] Psychoeducational Group Note  Date:  02/04/2013 Time:0930am  Group Topic/Focus:  Identifying Needs:   The focus of this group is to help patients identify their personal needs that have been historically problematic and identify healthy behaviors to address their needs.  Participation Level:  Did Not Attend  Participation Quality:    Affect:  Cognitive:  Insight:  Engagement in Group:  Additional Comments:  Pt unable to attend inventory group   Valente David 02/04/2013,10:07 AM

## 2013-02-04 NOTE — [facility] Suicide Risk Assessment (Addendum)
Suicide Risk Assessment  Admission Assessment     Nursing information obtained from:  Patient Demographic factors:  Unemployed;Low socioeconomic status Current Mental Status per Physician Patient describes hallucinations and delusions. Patient engages with poor eye contact, rambles and thrashes about her environment intruding on the property and possessions of others. Patient speaks with a natural volume, rate, and tone, but indicates very psychotic content of her thoughts. Orientation, anxiety and depression are not possible to adequately assess in her state of confusion and dyscontrol.  Judgement: unable to assess in her present state. Insight: extremely limited, but thinks that the voices are things that she should not be experiencing.  Loss Factors:  Financial problems / change in socioeconomic status Historical Factors:  NA Risk Reduction Factors:  Positive coping skills or problem solving skills  CLINICAL FACTORS:   Schizophrenia:   Paranoid or undifferentiated type Previous Psychiatric Diagnoses and Treatments  COGNITIVE FEATURES THAT CONTRIBUTE TO RISK:  Closed-mindedness Loss of executive function Thought constriction (tunnel vision)    SUICIDE RISK:   Moderate:  Frequent suicidal ideation with limited intensity, and duration, some specificity in terms of plans, no associated intent, good self-control, limited dysphoria/symptomatology, some risk factors present, and identifiable protective factors, including available and accessible social support.  PLAN OF CARE: 1 Admit for crisis management and stabilization.  Estimate length of stay is 3 to 5 days.  2. Medication management to reduce current symptoms to base line and improve the patient's overall level of functioning.  3. Treat health problems as indicated:  4. Develop treatment plan to decrease risk of relapse upon discharge and the need for readmission.  5. Psycho-social education regarding relapse prevention and  self-care.  6. Review and reinstate any pertinent home medication for other health issues where appropriate.  7. Call for Consult with [facility] for additional specialty patient services as needed.  I certify that inpatient services furnished can reasonably be expected to improve the patient's condition.  Orson Aloe, MD, [facility] 02/04/2013, 1:29 PM

## 2013-02-05 DIAGNOSIS — F32A Depression, unspecified: Secondary | ICD-10-CM | POA: Insufficient documentation

## 2013-02-05 DIAGNOSIS — F329 Major depressive disorder, single episode, unspecified: Secondary | ICD-10-CM | POA: Insufficient documentation

## 2013-02-05 DIAGNOSIS — F200 Paranoid schizophrenia: Secondary | ICD-10-CM | POA: Insufficient documentation

## 2013-02-05 DIAGNOSIS — F259 Schizoaffective disorder, unspecified: Principal | ICD-10-CM

## 2013-02-05 MED ORDER — OLANZAPINE 10 MG PO TBDP
10.0000 mg | ORAL_TABLET | Freq: Every day | ORAL | Status: DC
  Filled 2013-02-05: qty 1

## 2013-02-05 MED ORDER — FLUPHENAZINE HCL 5 MG PO TABS
5.0000 mg | ORAL_TABLET | ORAL | Status: DC
  Administered 2013-02-06 – 2013-02-07 (×2): 5 mg via ORAL
  Filled 2013-02-05 (×4): qty 1

## 2013-02-05 MED ORDER — OLANZAPINE 10 MG IM SOLR
INTRAMUSCULAR | Status: AC
  Administered 2013-02-05: 10 mg via INTRAMUSCULAR
  Filled 2013-02-05: qty 10

## 2013-02-05 MED ORDER — OLANZAPINE 10 MG IM SOLR
10.0000 mg | Freq: Once | INTRAMUSCULAR | Status: AC
  Administered 2013-02-05: 10 mg via INTRAMUSCULAR
  Filled 2013-02-05: qty 10

## 2013-02-05 MED ORDER — OLANZAPINE 10 MG PO TBDP
20.0000 mg | ORAL_TABLET | Freq: Every day | ORAL | Status: DC
Start: 2013-02-05 — End: 2013-02-05
  Filled 2013-02-05: qty 2

## 2013-02-05 MED ORDER — OLANZAPINE 10 MG PO TBDP
ORAL_TABLET | ORAL | Status: AC
  Filled 2013-02-05: qty 1

## 2013-02-05 MED ORDER — OLANZAPINE 10 MG PO TBDP
10.0000 mg | ORAL_TABLET | Freq: Every day | ORAL | Status: DC
  Administered 2013-02-05: 10 mg via ORAL
  Filled 2013-02-05 (×4): qty 1

## 2013-02-05 MED ORDER — OLANZAPINE 10 MG PO TBDP
10.0000 mg | ORAL_TABLET | Freq: Once | ORAL | Status: DC
  Filled 2013-02-05: qty 1

## 2013-02-05 NOTE — Clinical Social Work Note (Signed)
[facility] Group Notes: (Clinical Social Work)   02/05/2013      Type of Therapy:  Group Therapy   Participation Level:  Did Not Attend    Ambrose Mantle, LCSW 02/05/2013, 12:37 PM

## 2013-02-05 NOTE — Progress Notes (Signed)
Patient ID: Charlotte [name], female   DOB: 04/22/1966, 47 y.o.   MRN: [id number] 02-05-13 nursing shift note: D: pt continues to be disorganized, religiously, preoccupied and thought blocking. Her affect is blank but she is able to follow instructions.  A: with encouragement this am she took her medications. Staff helped her with her adl's. She is unable to participate in group. An order was written so husband could have liberate visitation, when appropriate, to support this patient/wife.  R: she continues to thought block. Staff will continue to support and encourage. RN will monitor and Q 15 min ck's continue.

## 2013-02-05 NOTE — Progress Notes (Signed)
Patient ID: Charlotte [name], female   DOB: 27-Nov-1966, 47 y.o.   MRN: [id number] Psychoeducational Group Note  Date:  02/05/2013 Time:  1515pm  Group Topic/Focus:  Making Healthy Choices:   The focus of this group is to help patients identify negative/unhealthy choices they were using prior to admission and identify positive/healthier coping strategies to replace them upon discharge.  Participation Level:  None  Participation Quality:  none  Affect:  Irritable and Labile  Cognitive:  Disorganized  Insight:  None  Engagement in Group:  None  Additional Comments:  Positive feeling group   Valente David 02/05/2013,4:53 PM

## 2013-02-05 NOTE — Progress Notes (Signed)
Psychoeducational Group Note  Date:  02/05/2013 Time:  2000  Group Topic/Focus:  Wrap-Up Group:   The focus of this group is to help patients review their daily goal of treatment and discuss progress on daily workbooks.  Participation Level: Did Not Attend  Participation Quality:  Not Applicable  Affect:  Not Applicable  Cognitive:  Not Applicable  Insight:  Not Applicable  Engagement in Group: Not Applicable  Additional Comments:  The patient did not attend group this evening as she remained in her room.   Hazle Coca S 02/05/2013, 9:52 PM

## 2013-02-05 NOTE — Progress Notes (Signed)
[facility] Group Notes:  (Nursing/MHT/Case Management/Adjunct)  Date:  02/04/2013 Time:  2000  Type of Therapy:  Psychoeducational Skills  Participation Level:  Minimal  Participation Quality:  Attentive and Drowsy  Affect:  Flat  Cognitive:  Disorganized  Insight:  Lacking  Engagement in Group:  Distracting  Modes of Intervention:  Education  Summary of Progress/Problems: The patient initially described her day as being positive. In addition, she was able to identify that she is attempting to work on some old issues ("mending fences") with her family. Shortly thereafter, she went off on a tangent regarding her need to express herself to "[name]" and that she was trying to address some issues with both his family as well as with her own. Her goal for tomorrow is to "treat everyone better".   [name] 02/05/2013, 1:20 AM

## 2013-02-05 NOTE — Progress Notes (Signed)
Patient ID: Charlotte [name], female   DOB: 19-Apr-1966, 47 y.o.   MRN: [id number] Psychoeducational Group Note  Date:  02/05/2013 Time:  1000am  Group Topic/Focus:  Making Healthy Choices:   The focus of this group is to help patients identify negative/unhealthy choices they were using prior to admission and identify positive/healthier coping strategies to replace them upon discharge.  Participation Level:  Did Not Attend  Participation Quality:    Affect:   Cognitive:   Insight:  Engagement in Group:  Additional Comments:  Inventory group- pt unable to attend.   Valente David 02/05/2013,9:50 AM

## 2013-02-05 NOTE — Progress Notes (Signed)
[facility] MD Progress Note  02/05/2013 11:35 AM Charlotte [name]  MRN:  [id number] Subjective:  "I'm Ok." Met with patient 1:1 to discuss her progress here. She slept well last night she reports she feels fine and asks when her husband is picking her up to go home." Diagnosis:  Schizoaffective disorder Bipolar type  ADL's:  Intact  Sleep: Fair  Appetite:  Poor  Suicidal Ideation:  "I don't know" Homicidal Ideation:  "I don't know" AEB (as evidenced by): patient's response, affect, and participation in unit programming.  Psychiatric Specialty Exam: Review of Systems  Constitutional: Negative.  Negative for fever, chills, weight loss, malaise/fatigue and diaphoresis.  HENT: Negative for congestion and sore throat.   Eyes: Negative for blurred vision, double vision and photophobia.  Respiratory: Negative for cough, shortness of breath and wheezing.   Cardiovascular: Negative for chest pain, palpitations and PND.  Gastrointestinal: Negative for heartburn, nausea, vomiting, abdominal pain, diarrhea and constipation.  Musculoskeletal: Negative for myalgias, joint pain and falls.  Neurological: Negative for dizziness, tingling, tremors, sensory change, speech change, focal weakness, seizures, loss of consciousness, weakness and headaches.  Endo/Heme/Allergies: Negative for polydipsia. Does not bruise/bleed easily.  Psychiatric/Behavioral: Negative for depression, suicidal ideas, hallucinations, memory loss and substance abuse. The patient is not nervous/anxious and does not have insomnia.     Blood pressure 124/83, pulse 95, temperature 97.2 F (36.2 C), temperature source Oral, resp. rate 18, height 5\' 5"  (1.651 m), weight 81.647 kg (180 lb).Body mass index is 29.95 kg/(m^2).  General Appearance: Disheveled  Eye Solicitor::  Fair  Speech:  Blocked  Volume:  Normal  Mood:  Anxious and Depressed  Affect:  Flat  Thought Process:  Disorganized  Orientation:  NA  Thought Content:  Hallucinations:  patient appears to be responding to internal stimuli  Suicidal Thoughts:  I don't know  Homicidal Thoughts:  I don't know  Memory:  NA  Judgement:  Impaired  Insight:  Lacking  Psychomotor Activity:  Psychomotor Retardation  Concentration:  Poor  Recall:  Poor  Akathisia:  No  Handed:  Right  AIMS (if indicated):     Assets:  Desire for Improvement  Sleep:  Number of Hours: 4.75   Current Medications: Current Facility-Administered Medications  Medication Dose Route Frequency Provider Last Rate Last Dose  . acetaminophen (TYLENOL) tablet 650 mg  650 mg Oral Q6H PRN Sanjuana Kava, NP      . alum & mag hydroxide-simeth (MAALOX/MYLANTA) 200-200-20 MG/5ML suspension 30 mL  30 mL Oral Q4H PRN Sanjuana Kava, NP      . benztropine (COGENTIN) tablet 1 mg  1 mg Oral BID Verne Spurr, PA-C   1 mg at 02/05/13 0802  . fluPHENAZine (PROLIXIN) tablet 10 mg  10 mg Oral QHS Verne Spurr, PA-C   10 mg at 02/04/13 2129  . hydrOXYzine (ATARAX/VISTARIL) tablet 25 mg  25 mg Oral Q4H PRN Sanjuana Kava, NP   25 mg at 02/04/13 1814  . magnesium hydroxide (MILK OF MAGNESIA) suspension 30 mL  30 mL Oral Daily PRN Sanjuana Kava, NP      . OLANZapine zydis (ZYPREXA) disintegrating tablet 30 mg  30 mg Oral QHS Verne Spurr, PA-C      . valproic acid (DEPAKENE) 250 MG capsule 250 mg  250 mg Oral QHS Verne Spurr, PA-C   250 mg at 02/04/13 2129  . venlafaxine XR (EFFEXOR-XR) 24 hr capsule 37.5 mg  37.5 mg Oral q morning - 10a Verne Spurr, PA-C  37.5 mg at 02/05/13 1914    Lab Results: No results found for this or any previous visit (from the past 48 hour(s)).  Physical Findings: AIMS: Facial and Oral Movements Muscles of Facial Expression: None, normal Lips and Perioral Area: None, normal Jaw: None, normal,Extremity Movements Upper (arms, wrists, hands, fingers): None, normal Lower (legs, knees, ankles, toes): None, normal, Trunk Movements Neck, shoulders, hips: None, normal, Overall Severity Severity  of abnormal movements (highest score from questions above): None, normal Incapacitation due to abnormal movements: None, normal Patient's awareness of abnormal movements (rate only patient's report): No Awareness, Dental Status Current problems with teeth and/or dentures?: No Does patient usually wear dentures?: No  CIWA:  CIWA-Ar Total: 0 COWS:  COWS Total Score: 0  Treatment Plan Summary: Daily contact with patient to assess and evaluate symptoms and progress in treatment Medication management  Plan: Dr. Lolly Mustache notes that she may need to be on 2 antipsychotics until she can become stabilized on Prolixin and depakene. Will need to cross taper off zyprexa while titrating up on Prolixn. 1. Continue crisis management and stabilization. 2. Medication management to reduce current symptoms to base line and improve patient's overall level of functioning 3. Treat health problems as indicated. 4. Develop treatment plan to decrease risk of relapse upon discharge and the need for readmission. 5. Psycho-social education regarding relapse prevention and self care. 6. Health care follow up as needed for medical problems. 7. Continue home medications where appropriate. 8. Will return to Zyprexa 20mg  at hs. ( this may be given IM or PO, and also divided into 2 (10) mg doses if patient escalates during the day.) 9.Will increase prolixin to 5mg  q AM and 10mg  at hs. 10. Will also slowly increase Depakene as she becomes more stable.  Medical Decision Making Problem Points:  Established problem, worsening (2) and Review of psycho-social stressors (1) Data Points:  Review or order clinical lab tests (1) Review or order medicine tests (1) Review and summation of old records (2) Review of medication regiment & side effects (2)  I certify that inpatient services furnished can reasonably be expected to improve the patient's condition.   Charlotte [name]. Charlotte [name] [facility] 02/05/2013 12:19 PM

## 2013-02-06 MED ORDER — OLANZAPINE 5 MG PO TBDP
5.0000 mg | ORAL_TABLET | ORAL | Status: AC
  Administered 2013-02-06: 5 mg via ORAL
  Filled 2013-02-06: qty 1

## 2013-02-06 MED ORDER — ZIPRASIDONE MESYLATE 20 MG IM SOLR
20.0000 mg | Freq: Once | INTRAMUSCULAR | Status: DC
  Filled 2013-02-06 (×2): qty 20

## 2013-02-06 MED ORDER — LORAZEPAM 1 MG PO TABS
1.0000 mg | ORAL_TABLET | Freq: Three times a day (TID) | ORAL | Status: DC | PRN
  Administered 2013-02-06 – 2013-02-21 (×14): 1 mg via ORAL
  Filled 2013-02-06 (×17): qty 1

## 2013-02-06 MED ORDER — OLANZAPINE 5 MG PO TBDP
ORAL_TABLET | ORAL | Status: AC
  Filled 2013-02-06: qty 1

## 2013-02-06 MED ORDER — VENLAFAXINE HCL ER 75 MG PO CP24
75.0000 mg | ORAL_CAPSULE | Freq: Every day | ORAL | Status: DC
  Administered 2013-02-07 – 2013-02-23 (×17): 75 mg via ORAL
  Filled 2013-02-06: qty 1
  Filled 2013-02-06: qty 14
  Filled 2013-02-06 (×19): qty 1

## 2013-02-06 MED ORDER — ZIPRASIDONE MESYLATE 20 MG IM SOLR
INTRAMUSCULAR | Status: AC
  Administered 2013-02-06: 18:00:00
  Filled 2013-02-06: qty 20

## 2013-02-06 NOTE — Treatment Plan (Signed)
Interdisciplinary Treatment Plan Update (Adult)  Date: 02/06/2013  Time Reviewed: 9:31 AM   Progress in Treatment: Attending groups: No Participating in groups: No Taking medication as prescribed: No Tolerating medication: No   Family/Significant other contact made:  No Patient understands diagnosis:  No  No insight Discussing patient identified problems/goals with staff:  Yes  See below Medical problems stabilized or resolved:  Yes Denies suicidal/homicidal ideation: No  Not answering questions Issues/concerns per patient self-inventory:  Not filled out Other:  New problem(s) identified: N/A  Reason for Continuation of Hospitalization: Hallucinations Medication stabilization  Interventions implemented related to continuation of hospitalization: Prolixin, Zyprexa, Depakene, Effexor trial.  Get second opinion if pt unwilling to take meds PO  15 min checks for safety  Additional comments:  Estimated length of stay: 4-5 days  Discharge Plan: see below  New goal(s): N/A  Review of initial/current patient goals per problem list:   1.  Goal(s): Eliminate psychosis with the use of medications/therapeutic milieu  Met:  No  Target date:2/21  As evidenced [id number] will no longer exhibit paranoid thinking, nor will she appear to be responding to internal stimuli or endorse delusional thinking  Furthermore, she will be oriented X3  2.  Goal (s):Stabilize mood with the use of medications/therapeutic milieu  Met:  No  Target date:2/21  As evidenced [id number] will no longer demonstrate mood lability  3.  Goal(s): Identify dispositional plan and oupt provider  Met:  No  Target date:2/21  As evidenced by:CSW to work with patient, family on plan  4.  Goal(s):  Met:  No  Target date:  As evidenced by:  Attendees: Patient:     Family:     Physician:  Thedore Mins 02/06/2013 9:31 AM   Nursing:   Liborio Nixon 02/06/2013 9:31 AM   Clinical Social Worker:  Richelle Ito 02/06/2013 9:31 AM   Extender:  Verne Spurr PA 02/06/2013 9:31 AM   Other:     Other:     Other:     Other:      Scribe for Treatment Team:   Ida Rogue, 02/06/2013 9:31 AM

## 2013-02-06 NOTE — Progress Notes (Signed)
Pt verbally and physically aggressive today. Pt was in seen in hallway naked, pt also ripped up all the patients name tag, knocked signs and pictures off the wall.  After being redirected by staff, pt continued to go in and out of other patients room. At one point, pt was on the floor in her room crawling around crying as if she was frighten by something or someone. Pt offered meds by Clinical research associate, pt refused. Pt was redirected several times today and was uncooperative. Pt had to be cirted for aggressive behaviors. Please see cirt packet.

## 2013-02-06 NOTE — Progress Notes (Signed)
Patient ID: Charlotte [name], female   DOB: [birth year], 47 y.o.   MRN: [id number] [facility] MD Progress Note  02/06/2013 11:21 AM Feliz Herard  MRN:  [id number] Subjective: Patient remains disorganized in thinking and unable to answer questions today. She did not recognized she was in the hospital. When she was asked to identify where she was; her  Answered was  " I want my children". However, I spoke with Dr. Lolly Mustache who treats her in the clinic. He states that patient suffers from extreme paranoia, psychosis and affective disorder and she has been hospitalized multiple times in the last one year. He thinks patient will benefit more if she can be followed by an ACT Team services upon discharge. Also, patient was brought to the hospital by her husband who reports that patient has  been extremely paranoid for the past 3 days after her medications were changed. He states that she has been behaving bizarre, throwing things and clothes around the house, calling 911 at inappropriate times, and stating that patient think she is pregnant with a baby    Diagnosis:  Schizoaffective disorder Bipolar type  ADL's:  Intact  Sleep: Fair  Appetite:  Poor  Suicidal Ideation:  Unable to assess Homicidal Ideation:  Unable to assess AEB (as evidenced by): patient's response, affect, and participation in unit programming.  Psychiatric Specialty Exam: Review of Systems  Constitutional: Negative.  Negative for fever, chills, weight loss, malaise/fatigue and diaphoresis.  HENT: Negative for congestion and sore throat.   Eyes: Negative for blurred vision, double vision and photophobia.  Respiratory: Negative for cough, shortness of breath and wheezing.   Cardiovascular: Negative for chest pain, palpitations and PND.  Gastrointestinal: Negative for heartburn, nausea, vomiting, abdominal pain, diarrhea and constipation.  Musculoskeletal: Negative for myalgias, joint pain and falls.  Neurological: Negative for dizziness,  tingling, tremors, sensory change, speech change, focal weakness, seizures, loss of consciousness, weakness and headaches.  Endo/Heme/Allergies: Negative for polydipsia. Does not bruise/bleed easily.  Psychiatric/Behavioral: Negative for depression, suicidal ideas, hallucinations, memory loss and substance abuse. The patient is not nervous/anxious and does not have insomnia.     Blood pressure 119/82, pulse 83, temperature 97.9 F (36.6 C), temperature source Oral, resp. rate 16, height 5\' 5"  (1.651 m), weight 81.647 kg (180 lb).Body mass index is 29.95 kg/(m^2).  General Appearance: Disheveled  Eye Solicitor::  Fair  Speech:  Blocked  Volume:  Normal  Mood:  Anxious and Depressed  Affect:  Flat  Thought Process:  Disorganized  Orientation:  NA  Thought Content:  Hallucinations: patient appears to be responding to internal stimuli  Suicidal Thoughts:  I don't know  Homicidal Thoughts:  I don't know  Memory:  NA  Judgement:  Impaired  Insight:  Lacking  Psychomotor Activity:  Psychomotor Retardation  Concentration:  Poor  Recall:  Poor  Akathisia:  No  Handed:  Right  AIMS (if indicated):     Assets:  Desire for Improvement  Sleep:  Number of Hours: 4   Current Medications: Current Facility-Administered Medications  Medication Dose Route Frequency Provider Last Rate Last Dose  . acetaminophen (TYLENOL) tablet 650 mg  650 mg Oral Q6H PRN Sanjuana Kava, NP      . alum & mag hydroxide-simeth (MAALOX/MYLANTA) 200-200-20 MG/5ML suspension 30 mL  30 mL Oral Q4H PRN Sanjuana Kava, NP      . benztropine (COGENTIN) tablet 1 mg  1 mg Oral BID Verne Spurr, PA-C   1 mg at 02/05/13 0802  .  fluPHENAZine (PROLIXIN) tablet 10 mg  10 mg Oral QHS Verne Spurr, PA-C   10 mg at 02/04/13 2129  . hydrOXYzine (ATARAX/VISTARIL) tablet 25 mg  25 mg Oral Q4H PRN Sanjuana Kava, NP   25 mg at 02/04/13 1814  . magnesium hydroxide (MILK OF MAGNESIA) suspension 30 mL  30 mL Oral Daily PRN Sanjuana Kava, NP       . OLANZapine zydis (ZYPREXA) disintegrating tablet 30 mg  30 mg Oral QHS Verne Spurr, PA-C      . valproic acid (DEPAKENE) 250 MG capsule 250 mg  250 mg Oral QHS Verne Spurr, PA-C   250 mg at 02/04/13 2129  . venlafaxine XR (EFFEXOR-XR) 24 hr capsule 37.5 mg  37.5 mg Oral q morning - 10a Verne Spurr, PA-C   37.5 mg at 02/05/13 0865    Lab Results: No results found for this or any previous visit (from the past 48 hour(s)).  Physical Findings: AIMS: Facial and Oral Movements Muscles of Facial Expression: None, normal Lips and Perioral Area: None, normal Jaw: None, normal,Extremity Movements Upper (arms, wrists, hands, fingers): None, normal Lower (legs, knees, ankles, toes): None, normal, Trunk Movements Neck, shoulders, hips: None, normal, Overall Severity Severity of abnormal movements (highest score from questions above): None, normal Incapacitation due to abnormal movements: None, normal Patient's awareness of abnormal movements (rate only patient's report): No Awareness, Dental Status Current problems with teeth and/or dentures?: No Does patient usually wear dentures?: No  CIWA:  CIWA-Ar Total: 0 COWS:  COWS Total Score: 0  Treatment Plan Summary: Daily contact with patient to assess and evaluate symptoms and progress in treatment Medication management  Plan: 1. Continue crisis management and stabilization. 2. Medication management to reduce current symptoms to base line and improve patient's overall level of functioning 3. Treat health problems as indicated. 4. Develop treatment plan to decrease risk of relapse upon discharge and the need for readmission. 5. Psycho-social education regarding relapse prevention and self care. 6. Health care follow up as needed for medical problems. 7. Continue home medications where appropriate. 8. Will continue Zyprexa 20mg  at hs. ( this may be given IM or PO, and also divided into 2 (10) mg doses if patient escalates during the  day.) 9.Will continue prolixin  5mg  q AM and 10mg  at hs. 10. Will also slowly increase Depakene as she becomes more stable.  Medical Decision Making Problem Points:  Established problem, worsening (2) and Review of psycho-social stressors (1) Data Points:  Review or order clinical lab tests (1) Review or order medicine tests (1) Review and summation of old records (2) Review of medication regiment & side effects (2)  I certify that inpatient services furnished can reasonably be expected to improve the patient's condition.   Thedore Mins, MD 02/06/2013 11:21 AM

## 2013-02-06 NOTE — Clinical Social Work Note (Signed)
  Type of Therapy: Process Group Therapy  Participation Level:  Did Not Attend  Arkie was observed walking down hallway naked right after group was dismissed.  Staff assisted in getting her back to her room again   Summary of Progress/Problems: Today's group addressed the issue of overcoming obstacles.  Patients were asked to identify their biggest obstacle post d/c that stands in the way of their on-going success, and then problem solve as to how to manage this.       [name] B 02/06/2013   5:14 PM

## 2013-02-06 NOTE — Progress Notes (Signed)
Patient ID: Charlotte [name], female   DOB: [birth year], 47 y.o.   MRN: [id number] D. The patient spent the evening resting in bed. Her thoughts are disorganized. Appears preoccupied and is having trouble articulating a complete, logical or relevant thought. After the other patients were in bed, she paced the hallway.  A. Met with the patient to assess. Redirected several times from wandering into other patient's rooms.  Administered HS medications. 15 minute checks maintained for safety. R. The patient is compliant with her medication. She is not responding well to redirection.

## 2013-02-06 NOTE — Tx Team (Signed)
Initial Interdisciplinary Treatment Plan  PATIENT STRENGTHS: (choose at least two) Supportive family/friends  PATIENT STRESSORS: Medication change or noncompliance   PROBLEM LIST: Problem List/Patient Goals Date to be addressed Date deferred Reason deferred Estimated date of resolution  SI 02/06/13     psychosis 02/06/13     Aggression  02/06/13                                          DISCHARGE CRITERIA:  Ability to meet basic life and health needs Adequate post-discharge living arrangements Improved stabilization in mood, thinking, and/or behavior Verbal commitment to aftercare and medication compliance  PRELIMINARY DISCHARGE PLAN: Attend aftercare/continuing care group Attend PHP/IOP  PATIENT/FAMIILY INVOLVEMENT: This treatment plan has been presented to and reviewed with the patient, Charlotte [name], and/or family member.  The patient and family have been given the opportunity to ask questions and make suggestions.  Charlotte [name] L 02/06/2013, 7:14 PM

## 2013-02-06 NOTE — Progress Notes (Signed)
[facility] LCSW Aftercare Discharge Planning Group Note  02/06/2013 5:13 PM  Participation Quality:  Did not attend   Charlotte [name] 02/06/2013, 5:13 PM

## 2013-02-07 ENCOUNTER — Ambulatory Visit (HOSPITAL_COMMUNITY): Payer: Self-pay | Admitting: Psychiatry

## 2013-02-07 MED ORDER — FLUPHENAZINE HCL 10 MG PO TABS
10.0000 mg | ORAL_TABLET | Freq: Two times a day (BID) | ORAL | Status: DC
  Administered 2013-02-07 – 2013-02-14 (×14): 10 mg via ORAL
  Filled 2013-02-07 (×4): qty 1
  Filled 2013-02-07: qty 2
  Filled 2013-02-07 (×9): qty 1
  Filled 2013-02-07: qty 2
  Filled 2013-02-07 (×3): qty 1

## 2013-02-07 MED ORDER — FLUPHENAZINE HCL 10 MG PO TABS
10.0000 mg | ORAL_TABLET | Freq: Every day | ORAL | Status: DC
  Administered 2013-02-07: 5 mg via ORAL
  Filled 2013-02-07 (×2): qty 1

## 2013-02-07 MED ORDER — LORAZEPAM 1 MG PO TABS
2.0000 mg | ORAL_TABLET | Freq: Once | ORAL | Status: AC
  Administered 2013-02-07: 2 mg via ORAL

## 2013-02-07 MED ORDER — LORAZEPAM 2 MG/ML IJ SOLN: 2.0000 mg | Freq: Once | INTRAMUSCULAR | Status: AC

## 2013-02-07 MED ORDER — OLANZAPINE 10 MG PO TBDP
10.0000 mg | ORAL_TABLET | Freq: Two times a day (BID) | ORAL | Status: DC
  Administered 2013-02-08 – 2013-02-10 (×5): 10 mg via ORAL
  Filled 2013-02-07 (×7): qty 1

## 2013-02-07 NOTE — Progress Notes (Addendum)
Patient ID: Charlotte [name], female   DOB: Mar 20, 1966, 47 y.o.   MRN: [id number] D: pt. In bed eyes closed, changes positions occasionally, eyes blink when spoken to. A: Writer introduces self to pt. But she does not Printmaker. Staff will monitor q53min for safety. R: Pt. Is safe on the unit.

## 2013-02-07 NOTE — Progress Notes (Signed)
Patient ID: Charlotte [name], female   DOB: 09/09/1966, 47 y.o.   MRN: [id number] [facility] MD Progress Note  02/07/2013 2:45 PM Tonji Elliff  MRN:  [id number] Subjective: Patient remains disorganized in thinking and is concerned "that I have a stalker, I don't know the name." She goes on to answer most questions with "I don't know." She states she has a "need for marital counseling" since there is no one to talk to her and [name] when everybody leaves. Diagnosis:  Schizoaffective disorder Bipolar type  ADL's:  Intact  Sleep: Fair  Appetite:  Poor  Suicidal Ideation:  Unable to assess Homicidal Ideation:  Unable to assess AEB (as evidenced by): patient's response, affect, and participation in unit programming.  Psychiatric Specialty Exam: Review of Systems  Constitutional: Negative.  Negative for fever, chills, weight loss, malaise/fatigue and diaphoresis.  HENT: Negative for congestion and sore throat.   Eyes: Negative for blurred vision, double vision and photophobia.  Respiratory: Negative for cough, shortness of breath and wheezing.   Cardiovascular: Negative for chest pain, palpitations and PND.  Gastrointestinal: Negative for heartburn, nausea, vomiting, abdominal pain, diarrhea and constipation.  Musculoskeletal: Negative for myalgias, joint pain and falls.  Neurological: Negative for dizziness, tingling, tremors, sensory change, speech change, focal weakness, seizures, loss of consciousness, weakness and headaches.  Endo/Heme/Allergies: Negative for polydipsia. Does not bruise/bleed easily.  Psychiatric/Behavioral: Negative for depression, suicidal ideas, hallucinations, memory loss and substance abuse. The patient is not nervous/anxious and does not have insomnia.     Blood pressure 113/64, pulse 93, temperature 98.2 F (36.8 C), temperature source Oral, resp. rate 16, height 5\' 5"  (1.651 m), weight 81.647 kg (180 lb).Body mass index is 29.95 kg/(m^2).  General Appearance:  Disheveled  Eye Solicitor::  Fair  Speech:  Blocked  Volume:  Normal  Mood:  Anxious and Depressed  Affect:  Flat  Thought Process:  Disorganized  Orientation:  NA  Thought Content:  Hallucinations: patient appears to be responding to internal stimuli  Suicidal Thoughts:  I don't know  Homicidal Thoughts:  I don't know  Memory:  NA  Judgement:  Impaired  Insight:  Lacking  Psychomotor Activity:  Psychomotor Retardation  Concentration:  Poor  Recall:  Poor  Akathisia:  No  Handed:  Right  AIMS (if indicated):     Assets:  Desire for Improvement  Sleep:  Number of Hours: 5.5   Current Medications: Current Facility-Administered Medications  Medication Dose Route Frequency Provider Last Rate Last Dose  . acetaminophen (TYLENOL) tablet 650 mg  650 mg Oral Q6H PRN Sanjuana Kava, NP      . alum & mag hydroxide-simeth (MAALOX/MYLANTA) 200-200-20 MG/5ML suspension 30 mL  30 mL Oral Q4H PRN Sanjuana Kava, NP      . benztropine (COGENTIN) tablet 1 mg  1 mg Oral BID Verne Spurr, PA-C   1 mg at 02/05/13 0802  . fluPHENAZine (PROLIXIN) tablet 10 mg  10 mg Oral QHS Verne Spurr, PA-C   10 mg at 02/04/13 2129  . hydrOXYzine (ATARAX/VISTARIL) tablet 25 mg  25 mg Oral Q4H PRN Sanjuana Kava, NP   25 mg at 02/04/13 1814  . magnesium hydroxide (MILK OF MAGNESIA) suspension 30 mL  30 mL Oral Daily PRN Sanjuana Kava, NP      . OLANZapine zydis (ZYPREXA) disintegrating tablet 30 mg  30 mg Oral QHS Verne Spurr, PA-C      . valproic acid (DEPAKENE) 250 MG capsule 250 mg  250 mg Oral  QHS Verne Spurr, PA-C   250 mg at 02/04/13 2129  . venlafaxine XR (EFFEXOR-XR) 24 hr capsule 37.5 mg  37.5 mg Oral q morning - 10a Verne Spurr, PA-C   37.5 mg at 02/05/13 9811    Lab Results: No results found for this or any previous visit (from the past 48 hour(s)).  Physical Findings: AIMS: Facial and Oral Movements Muscles of Facial Expression: None, normal Lips and Perioral Area: None, normal Jaw: None,  normal Tongue: None, normal,Extremity Movements Upper (arms, wrists, hands, fingers): None, normal Lower (legs, knees, ankles, toes): None, normal, Trunk Movements Neck, shoulders, hips: None, normal, Overall Severity Severity of abnormal movements (highest score from questions above): None, normal Incapacitation due to abnormal movements: None, normal Patient's awareness of abnormal movements (rate only patient's report): No Awareness, Dental Status Current problems with teeth and/or dentures?: No Does patient usually wear dentures?: No  CIWA:  CIWA-Ar Total: 0 COWS:  COWS Total Score: 0  Treatment Plan Summary: Daily contact with patient to assess and evaluate symptoms and progress in treatment Medication management  Plan: 1. Continue crisis management and stabilization. 2. Medication management to reduce current symptoms to base line and improve patient's overall level of functioning 3. Treat health problems as indicated. 4. Develop treatment plan to decrease risk of relapse upon discharge and the need for readmission. 5. Psycho-social education regarding relapse prevention and self care. 6. Health care follow up as needed for medical problems. 7. Continue home medications where appropriate. 8. Will increase patient back to zyprexa 30 mg a day after discussion with Dr. Jannifer Franklin due to the severity of the patient's symptoms. 9.Will increase prolixin to 10 mg q AM and 10mg  at hs. 10. Will also slowly increase Depakene as she becomes more stable. 11. Medical Decision Making Problem Points:  Established problem, worsening (2) and Review of psycho-social stressors (1) Data Points:  Review or order clinical lab tests (1) Review or order medicine tests (1) Review and summation of old records (2) Review of medication regiment & side effects (2)  I certify that inpatient services furnished can reasonably be expected to improve the patient's condition.    Rona Ravens. Cullin Dishman  [facility] 02/07/2013 2:45 PM

## 2013-02-07 NOTE — Clinical Social Work Note (Signed)
[facility] LCSW Group Therapy  02/07/2013 , 3:46 PM   Type of Therapy:  Group Therapy  Participation Level:  Active  Participation Quality:  Attentive  Affect:  Appropriate  Cognitive:  Alert  Insight:  Improving  Engagement in Therapy:  Engaged  Modes of Intervention:  Discussion, Exploration and Socialization  Summary of Progress/Problems: Today's group focused on the term Diagnosis.  Participants were asked to define the term, and then pronounce whether it is a negative, positive or neutral term.  Ashawnti appeared as lucid and connected to our process today.  She participated in the opening activity.   She then contributed that she was thankful for another day, which led to a nice discussion on mindfullness and staying in the moment.  [name] B 02/07/2013 , 3:46 PM

## 2013-02-07 NOTE — Progress Notes (Signed)
D:  Patient visited with husband this morning.  Affect was flat, dull.  As the day progressed she became more animated, more oriented, and was able to have a normal conversation with staff.  By late afternoon, early evening she became confused, had flight of ideas, and had rambling speech none of which made much sense.  She has taken and tolerated her medications today.  A:  Spoke with husband at length today.  Reassured him that the doctor was working on adjusting the medications so that they were more effective and patient was feeling more like herself.  Also reassured him that he would have a complete list of medications prior to her discharge.   R:  Pleasant and cooperative this morning.  Flight of ideas and confusion this afternoon.  Patient can be re-directed and brought back "into the moment," but needs plenty of patience from staff.  She is interacting a bit with peers during groups.

## 2013-02-07 NOTE — Progress Notes (Signed)
Patient ID: Charlotte [name], female   DOB: 03/11/1966, 47 y.o.   MRN: [id number] D: Pt. Sitting in the middle of the bed naked, trembling.  Pt. Reports I'm scared of everything. Pt. Praying with eyes closed does not make eye contact. Pt. Armband is ineffective. A: Writer introduced self, gained pt. Trust to help her put on gowns, but pt.refused to shower "I'm scared to go in there" "I'm scared of you". Pt. Encouraged to take meds.(see MAR) Pt. Given another armband but refused it.  Writer put in pt. Drawer. Staff will monitor for safety q41min . R: Pt. Is safe on the unit.

## 2013-02-07 NOTE — Progress Notes (Signed)
[facility] LCSW Aftercare Discharge Planning Group Note  02/07/2013 3:40 PM  Participation Quality:  Did not attend    Charlotte [name] 02/07/2013, 3:40 PM

## 2013-02-08 MED ORDER — LORAZEPAM 1 MG PO TABS: 2.0000 mg | ORAL_TABLET | Freq: Once | ORAL | Status: AC

## 2013-02-08 MED ORDER — LORAZEPAM 1 MG PO TABS
ORAL_TABLET | ORAL | Status: AC
  Administered 2013-02-08: 2 mg
  Filled 2013-02-08: qty 2

## 2013-02-08 MED ORDER — ZIPRASIDONE MESYLATE 20 MG IM SOLR
20.0000 mg | Freq: Once | INTRAMUSCULAR | Status: AC
  Administered 2013-02-08: 20 mg via INTRAMUSCULAR

## 2013-02-08 NOTE — Progress Notes (Addendum)
Patient ID: Charlotte [name], female   DOB: 1966/11/21, 47 y.o.   MRN: [id number]  D: Pt continues to be paranoid. Pt states she can't tell the difference between reality and fantsasy. Pt woke up in the morning and was praying in the floor in front of the med window. Pt was  Escorted to her room , then taken to treatment team room. Pt woke up trembling and delusional, Pt oriented X2 to date and place only. Pt endorses AVH. Pt denies SI/HI/. Pt says " demons hide in psychological places, I feel like the demons are talking to me through the rooms". " I think the ddvil is mad at me, I saw the whole world murdered, I told someone i was a prophet". Pt continues to have flight of ideas, disorganized thoughts with religious undertones. Pt continues to exhibit delusional thinking about making some deal with the devil. PT Hyper-religous, continuing to pray over and over. Pt states she can see, hear, and smell various things that are not visible to this Clinical research associate.  A: Pt was offered support and encouragement. Pt was given scheduled medications. Pt was encourage to attend groups. Q 15 minute checks were done for safety. Pt was given vistaril at 640.    R:Pt attends groups and interacts well with peers and staff. Pt is taking medication. Pt has no complaints at this time.Pt receptive to treatment and safety maintained on unit.

## 2013-02-08 NOTE — Clinical Social Work Note (Signed)
[facility] Group Notes:  (Counselor/Nursing/MHT/Case Management/Adjunct)  11/04/2012 12:00 PM  Type of Therapy:  Group Therapy  Participation Level:  Minimal  Participation Quality:  Redirectable  Affect:  Excited  Cognitive:  Disorganized  Insight:  Lacking  Engagement in Group:  Developing/Improving  Engagement in Therapy:  Developing/Improving  Modes of Intervention:  Discussion, Support, Socialization  Summary of Progress/Problems: ONG:[id number] came to group 30 minutes late and listened to speaker play the guitar. She occasionally would interrupt with questions about her family or to make statements about off-topic situations but was easily redirectable. She thanked the speaker for playing guitar and stated that she "loves guitar music."    Smart, [place] 11/04/2012, 12:00 PM

## 2013-02-08 NOTE — Progress Notes (Signed)
Patient observed in dayroom manic and hyper religious; patient pacing around and yelling. MD notified and order for Geodon 20mg  IM given. Patient currently in room resting. Will continue to monitor patient.

## 2013-02-08 NOTE — Progress Notes (Signed)
D: Patient denies SI/HI. The patient is positive for auditory hallucinations but states that she "doesn't know what they're saying." Patient has an anxious mood and affect. The patient is also religiously preoccupied and is frequently seen kneeling to pray and is touching people saying that it is her "healing hand." The patient is inappropriate and will try to touch other patients but patient is easily redirectable.  A: Patient given emotional support from RN. Patient encouraged to come to staff with concerns and/or questions. Patient's medication routine continued. Patient's orders and plan of care reviewed.  R: Patient remains safe. Will continue to monitor patient q15 minutes for safety.

## 2013-02-08 NOTE — Progress Notes (Signed)
[facility] LCSW Aftercare Discharge Planning Group Note  02/08/2013 1:57 PM  Participation Quality:  Inattentive  Affect:  Blunted  Cognitive:  Oriented  Insight:  None  Engagement in Group:  Limited  Modes of Intervention:  Discussion, Exploration and Socialization  Summary of Progress/Problems:  Charlotte [name] presented as more disorganized than yesterday in group.  She got down in front of another patient on her knees, and other group members informed me she was praying for the patient.  She responded to my directives when I told her she was invading the other patient's space and needed her to back up.  She also asked my about my daughter and my relationship with [name].  Pleasant mood.  [name] B 02/08/2013, 1:57 PM

## 2013-02-08 NOTE — Progress Notes (Signed)
The focus of this group is to help patients review their daily goal of treatment and discuss progress on daily workbooks. Pt entered the dayroom for wrap-up group, pacing quickly behind staff, and managed to catch a closing door and sneak into the galley. Pt was very difficult to redirect out of the galley and unable to sit down for group. Pt left the dayroom shortly into group and did not return.

## 2013-02-08 NOTE — Progress Notes (Signed)
Patient ID: Charlotte [name], female   DOB: [birth year], 47 y.o.   MRN: [id number] [facility] MD Progress Note  02/08/2013 11:22 AM Charlotte [name]  MRN:  [id number] Subjective: Patient remains disorganized in thinking, bizarre, delusional and psychotic. However, she is less agitated and sleeping better. She did not report any adverse reaction to her medications.  Diagnosis:  Schizoaffective disorder Bipolar type  ADL's:  Intact  Sleep: Fair  Appetite:  Poor  Suicidal Ideation:  Unable to assess Homicidal Ideation:  Unable to assess AEB (as evidenced by): patient's response, affect, and participation in unit programming.  Psychiatric Specialty Exam: Review of Systems  Constitutional: Negative.  Negative for fever, chills, weight loss, malaise/fatigue and diaphoresis.  HENT: Negative for congestion and sore throat.   Eyes: Negative for blurred vision, double vision and photophobia.  Respiratory: Negative for cough, shortness of breath and wheezing.   Cardiovascular: Negative for chest pain, palpitations and PND.  Gastrointestinal: Negative for heartburn, nausea, vomiting, abdominal pain, diarrhea and constipation.  Musculoskeletal: Negative for myalgias, joint pain and falls.  Neurological: Negative for dizziness, tingling, tremors, sensory change, speech change, focal weakness, seizures, loss of consciousness, weakness and headaches.  Endo/Heme/Allergies: Negative for polydipsia. Does not bruise/bleed easily.  Psychiatric/Behavioral: Negative for depression, suicidal ideas, hallucinations, memory loss and substance abuse. The patient is not nervous/anxious and does not have insomnia.     Blood pressure 126/80, pulse 100, temperature 97.6 F (36.4 C), temperature source Oral, resp. rate 18, height 5\' 5"  (1.651 m), weight 81.647 kg (180 lb).Body mass index is 29.95 kg/(m^2).  General Appearance: Disheveled  Eye Solicitor::  Fair  Speech:  Blocked  Volume:  Normal  Mood:  Anxious and Depressed   Affect:  Flat  Thought Process:  Disorganized  Orientation:  NA  Thought Content:  Hallucinations: patient appears to be responding to internal stimuli  Suicidal Thoughts:  I don't know  Homicidal Thoughts:  I don't know  Memory:  NA  Judgement:  Impaired  Insight:  Lacking  Psychomotor Activity:  Psychomotor Retardation  Concentration:  Poor  Recall:  Poor  Akathisia:  No  Handed:  Right  AIMS (if indicated):     Assets:  Desire for Improvement  Sleep:  Number of Hours: 5.25   Current Medications: Current Facility-Administered Medications  Medication Dose Route Frequency Provider Last Rate Last Dose  . acetaminophen (TYLENOL) tablet 650 mg  650 mg Oral Q6H PRN Sanjuana Kava, NP      . alum & mag hydroxide-simeth (MAALOX/MYLANTA) 200-200-20 MG/5ML suspension 30 mL  30 mL Oral Q4H PRN Sanjuana Kava, NP      . benztropine (COGENTIN) tablet 1 mg  1 mg Oral BID Verne Spurr, PA-C   1 mg at 02/05/13 0802  . fluPHENAZine (PROLIXIN) tablet 10 mg  10 mg Oral QHS Verne Spurr, PA-C   10 mg at 02/04/13 2129  . hydrOXYzine (ATARAX/VISTARIL) tablet 25 mg  25 mg Oral Q4H PRN Sanjuana Kava, NP   25 mg at 02/04/13 1814  . magnesium hydroxide (MILK OF MAGNESIA) suspension 30 mL  30 mL Oral Daily PRN Sanjuana Kava, NP      . OLANZapine zydis (ZYPREXA) disintegrating tablet 30 mg  30 mg Oral QHS Verne Spurr, PA-C      . valproic acid (DEPAKENE) 250 MG capsule 250 mg  250 mg Oral QHS Verne Spurr, PA-C   250 mg at 02/04/13 2129  . venlafaxine XR (EFFEXOR-XR) 24 hr capsule 37.5 mg  37.5 mg  Oral q morning - 10a Verne Spurr, PA-C   37.5 mg at 02/05/13 8657    Lab Results: No results found for this or any previous visit (from the past 48 hour(s)).  Physical Findings: AIMS: Facial and Oral Movements Muscles of Facial Expression: None, normal Lips and Perioral Area: None, normal Jaw: None, normal Tongue: None, normal,Extremity Movements Upper (arms, wrists, hands, fingers): None,  normal Lower (legs, knees, ankles, toes): None, normal, Trunk Movements Neck, shoulders, hips: None, normal, Overall Severity Severity of abnormal movements (highest score from questions above): None, normal Incapacitation due to abnormal movements: None, normal Patient's awareness of abnormal movements (rate only patient's report): No Awareness, Dental Status Current problems with teeth and/or dentures?: No Does patient usually wear dentures?: No  CIWA:  CIWA-Ar Total: 0 COWS:  COWS Total Score: 0  Treatment Plan Summary: Daily contact with patient to assess and evaluate symptoms and progress in treatment Medication management  Plan: 1. Continue crisis management and stabilization. 2. Medication management to reduce current symptoms to base line and improve patient's overall level of functioning 3. Treat health problems as indicated. 4. Develop treatment plan to decrease risk of relapse upon discharge and the need for readmission. 5. Psycho-social education regarding relapse prevention and self care. 6. Health care follow up as needed for medical problems. 7. Continue home medications where appropriate. 8. Will increase Zyprexa to 15 mg  Twice daily. 9.Will continue prolixin 10 mg q AM and 10mg  at hs. 10. Will also slowly increase Depakene as she becomes more stable.  Medical Decision Making Problem Points:  Established problem, worsening (2) and Review of psycho-social stressors (1) Data Points:  Review or order clinical lab tests (1) Review or order medicine tests (1) Review and summation of old records (2) Review of medication regiment & side effects (2)  I certify that inpatient services furnished can reasonably be expected to improve the patient's condition.    Thedore Mins, MD 02/08/2013 11:22 AM

## 2013-02-08 NOTE — Progress Notes (Signed)
Recreation Therapy Notes  Date: 02.19.2014  Time: 9:30am      Group Topic/Focus: Leisure Education  Participation Level: Did not attend   [employer], LRT/CTRS

## 2013-02-09 NOTE — Progress Notes (Signed)
Psychoeducational Group Note  Date:  02/09/2013 Time:  0930  Group Topic/Focus:  Self Esteem Action Plan:   The focus of this group is to help patients create a plan to continue to build self-esteem after discharge.  Participation Level: Did Not Attend  Participation Quality:  Not Applicable  Affect:  Not Applicable  Cognitive:  Not Applicable  Insight:  Not Applicable  Engagement in Group: Not Applicable  Additional Comments:  Pt walked out of the day room  just as group was getting started.  Giovanni Biby E 02/09/2013, 11:35 AM

## 2013-02-09 NOTE — Progress Notes (Signed)
[facility] LCSW Aftercare Discharge Planning Group Note  02/09/2013 3:16 PM  Participation Quality:  Inattentive  Affect:  Blunted  Cognitive:  Disorganized  Insight:  None  Engagement in Group:  Limited  Modes of Intervention:  Discussion, Exploration and Socialization  Summary of Progress/Problems:Charlotte sat quietly through group.  Appeared to be responding to internal stimuli.    [name] B 02/09/2013, 3:16 PM

## 2013-02-09 NOTE — Progress Notes (Signed)
Patient ID: Charlotte [name], female   DOB: May 07, 1966, 47 y.o.   MRN: [id number] D: Patient presented with depressed mood and flat affect. Pt stated she is not able to go to groups because she dislike large groups.  Pt denies SI/HI/AVH and pain. Pt attended evening wrap up group and Interacted appropriately with peers. Cooperative with assessment. No acute distressed noted at this time.   A: Met with pt 1:1. Medications administered as prescribed. Writer encouraged pt to discuss feelings. Pt encouraged to come to staff with any question or concerns.  R: Patient remains safe. She is complaint with medications. Continue current POC.

## 2013-02-09 NOTE — Progress Notes (Signed)
Patient ID: Charlotte [name], female   DOB: July 13, 1966, 47 y.o.   MRN: [id number] [facility] MD Progress Note  02/09/2013 2:56 PM Charlotte [name]  MRN:  [id number] Subjective: Met with the patient today and she is still disorganized. She was sitting on the floor in another patient's room today which agitated the already agitated patient even more. Fredrick has started her menses and was trying to washout her clothes in the sink in her bathroom. She was less anxious today and reports she feels responsible for the world. Diagnosis:  Schizoaffective disorder Bipolar type  ADL's:  Intact  Sleep: Fair  Appetite:  Poor  Suicidal Ideation:  Unable to assess Homicidal Ideation:  Unable to assess AEB (as evidenced by): patient's response, affect, and participation in unit programming.  Psychiatric Specialty Exam: Review of Systems  Constitutional: Negative.  Negative for fever, chills, weight loss, malaise/fatigue and diaphoresis.  HENT: Negative for congestion and sore throat.   Eyes: Negative for blurred vision, double vision and photophobia.  Respiratory: Negative for cough, shortness of breath and wheezing.   Cardiovascular: Negative for chest pain, palpitations and PND.  Gastrointestinal: Negative for heartburn, nausea, vomiting, abdominal pain, diarrhea and constipation.  Musculoskeletal: Negative for myalgias, joint pain and falls.  Neurological: Negative for dizziness, tingling, tremors, sensory change, speech change, focal weakness, seizures, loss of consciousness, weakness and headaches.  Endo/Heme/Allergies: Negative for polydipsia. Does not bruise/bleed easily.  Psychiatric/Behavioral: Negative for depression, suicidal ideas, hallucinations, memory loss and substance abuse. The patient is not nervous/anxious and does not have insomnia.     Blood pressure 107/75, pulse 93, temperature 97.4 F (36.3 C), temperature source Oral, resp. rate 18, height 5\' 5"  (1.651 m), weight 81.647 kg (180  lb).Body mass index is 29.95 kg/(m^2).  General Appearance: Disheveled  Eye Solicitor::  Fair  Speech:  Blocked  Volume:  Normal  Mood:  Anxious and Depressed  Affect:  Flat  Thought Process:  Disorganized  Orientation:  NA  Thought Content:  Hallucinations: patient appears to be responding to internal stimuli  Suicidal Thoughts:  I don't know  Homicidal Thoughts:  I don't know  Memory:  NA  Judgement:  Impaired  Insight:  Lacking  Psychomotor Activity:  Psychomotor Retardation  Concentration:  Poor  Recall:  Poor  Akathisia:  No  Handed:  Right  AIMS (if indicated):     Assets:  Desire for Improvement  Sleep:  Number of Hours: 3.25   Current Medications: Current Facility-Administered Medications  Medication Dose Route Frequency Provider Last Rate Last Dose  . acetaminophen (TYLENOL) tablet 650 mg  650 mg Oral Q6H PRN Sanjuana Kava, NP      . alum & mag hydroxide-simeth (MAALOX/MYLANTA) 200-200-20 MG/5ML suspension 30 mL  30 mL Oral Q4H PRN Sanjuana Kava, NP      . benztropine (COGENTIN) tablet 1 mg  1 mg Oral BID Verne Spurr, PA-C   1 mg at 02/05/13 0802  . fluPHENAZine (PROLIXIN) tablet 10 mg  10 mg Oral QHS Verne Spurr, PA-C   10 mg at 02/04/13 2129  . hydrOXYzine (ATARAX/VISTARIL) tablet 25 mg  25 mg Oral Q4H PRN Sanjuana Kava, NP   25 mg at 02/04/13 1814  . magnesium hydroxide (MILK OF MAGNESIA) suspension 30 mL  30 mL Oral Daily PRN Sanjuana Kava, NP      . OLANZapine zydis (ZYPREXA) disintegrating tablet 30 mg  30 mg Oral QHS Verne Spurr, PA-C      . valproic acid (DEPAKENE) 250  MG capsule 250 mg  250 mg Oral QHS Verne Spurr, PA-C   250 mg at 02/04/13 2129  . venlafaxine XR (EFFEXOR-XR) 24 hr capsule 37.5 mg  37.5 mg Oral q morning - 10a Verne Spurr, PA-C   37.5 mg at 02/05/13 1610    Lab Results: No results found for this or any previous visit (from the past 48 hour(s)).  Physical Findings: AIMS: Facial and Oral Movements Muscles of Facial Expression: None,  normal Lips and Perioral Area: None, normal Jaw: None, normal Tongue: None, normal,Extremity Movements Upper (arms, wrists, hands, fingers): None, normal Lower (legs, knees, ankles, toes): None, normal, Trunk Movements Neck, shoulders, hips: None, normal, Overall Severity Severity of abnormal movements (highest score from questions above): None, normal Incapacitation due to abnormal movements: None, normal Patient's awareness of abnormal movements (rate only patient's report): No Awareness, Dental Status Current problems with teeth and/or dentures?: No Does patient usually wear dentures?: No  CIWA:  CIWA-Ar Total: 0 COWS:  COWS Total Score: 0  Treatment Plan Summary: Daily contact with patient to assess and evaluate symptoms and progress in treatment Medication management  Plan: 1. Continue crisis management and stabilization. 2. Medication management to reduce current symptoms to base line and improve patient's overall level of functioning 3. Treat health problems as indicated. 4. Develop treatment plan to decrease risk of relapse upon discharge and the need for readmission. 5. Psycho-social education regarding relapse prevention and self care. 6. Health care follow up as needed for medical problems. 7. Continue home medications where appropriate. 8. Will increase Zyprexa to 15 mg  Twice daily. 9.Will continue prolixin 10 mg q AM and 10mg  at hs. 10. Will hold on  increase Depakene as she becomes more stable. 11. She will need close observation due to her wandering and possibly a 1:1.  Medical Decision Making Problem Points:  Established problem, worsening (2) and Review of psycho-social stressors (1) Data Points:  Review or order clinical lab tests (1) Review or order medicine tests (1) Review and summation of old records (2) Review of medication regiment & side effects (2)  I certify that inpatient services furnished can reasonably be expected to improve the patient's  condition.   Rona Ravens. Clytie Shetley RPAC 3:00 PM 02/09/2013

## 2013-02-09 NOTE — Clinical Social Work Note (Signed)
[facility] Group Notes:  (Counselor/Nursing/MHT/Case Management/Adjunct)  11/03/2012 2:20 PM  Type of Therapy:  Group Therapy  Participation Level:  Did Not Attend    Summary of Progress/Problems: .balance: The topic for group was balance in life.  Pt participated in the discussion about when their life was in balance and out of balance and how this feels.  Pt discussed ways to get back in balance and short term goals they can work on to get where they want to be.    Charlotte [name] B 11/03/2012, 2:20 PM  

## 2013-02-09 NOTE — Treatment Plan (Signed)
  Interdisciplinary Treatment Plan Update   Date Reviewed:  02/09/2013  Time Reviewed:  8:37 AM  Progress in Treatment:   Attending groups: Yes Participating in groups: Minimally Taking medication as prescribed: Yes  Tolerating medication: Yes Family/Significant other contact made: Yes  Patient understands diagnosis: No  Limited insight Discussing patient identified problems/goals with staff: Yes  See initial tx plan Medical problems stabilized or resolved: Yes Denies suicidal/homicidal ideation: Yes Patient has not harmed self or others: Yes  For review of initial/current patient goals, please see plan of care.  Estimated Length of Stay:  4-5 days  Reason for Continuation of Hospitalization: Medication stabilization Other; describe Disorganized thinking  New Problems/Goals identified:  N/A  Discharge Plan or Barriers:   return home,  Follow up ACT team  Additional Comments:  Attendees:  Signature: Thedore Mins, MD 02/09/2013 8:37 AM   Signature: Richelle Ito, LCSW 02/09/2013 8:37 AM  Signature: Verne Spurr, PA 02/09/2013 8:37 AM  Signature: Joslyn Devon, RN 02/09/2013 8:37 AM  Signature:  02/09/2013 8:37 AM  Signature:  02/09/2013 8:37 AM  Signature:   02/09/2013 8:37 AM  Signature:    Signature:    Signature:    Signature:    Signature:    Signature:      Scribe for Treatment Team:   Richelle Ito, LCSW  02/09/2013 8:37 AM

## 2013-02-09 NOTE — Progress Notes (Signed)
Patient ID: Charlotte [name], female   DOB: 05/08/1966, 47 y.o.   MRN: [id number] Patient remains with disorganized thought processes.  She is compliant with her medications.  She is intrusive at times, going in other patients rooms and sitting on the floor.  She washed her clothing in the sink in her room and is not taking of her personal hygiene properly.  Patient continues to pace the hallway singing.  She received ativan 1 mg this afternoon with some relief of her anxiety.  She had a pleasant visit with her husband during lunch.  Continue to assess and maintain safety.  Patient denies SI/HI/AVH.

## 2013-02-09 NOTE — Progress Notes (Addendum)
Patient ID: Charlotte [name], female   DOB: 1966-11-30, 47 y.o.   MRN: [id number] D: pt. Visible on the unit having to be redirected numerous times for intruding and touching people. Pt. Continues to try to pray for people. Pt. Talks on phone to husband tonight. Pt. Says "you know I'm a sex addict by nature"  Pt. Invades writer's personal space, circling her and singing rhymes. A: Writer redirects pt. And administers meds as needed for anxiety. Staff will monitor q59min for safety. R: Pt. Is safe on the unit. Pt. Can be redirected after much convincing.

## 2013-02-10 MED ORDER — OLANZAPINE 5 MG PO TBDP
15.0000 mg | ORAL_TABLET | Freq: Two times a day (BID) | ORAL | Status: DC
  Administered 2013-02-10 – 2013-02-23 (×26): 15 mg via ORAL
  Filled 2013-02-10 (×9): qty 3
  Filled 2013-02-10: qty 84
  Filled 2013-02-10 (×19): qty 3
  Filled 2013-02-10: qty 84
  Filled 2013-02-10 (×3): qty 3

## 2013-02-10 NOTE — Progress Notes (Signed)
1:1 for 1445  D Pt remains on a 1:1 for continued lability, intrusiveness, inability to controll her impulsive behavior. Her speech rambles. She has disorganized thought processes and she has inappropriate  Verbal respnses, as well.  A SHe has a labile mood, cries and tears up easily, but  Allows staff to calm her down. SHe takes redirection from staff well.  R Safety is in place and POC cont.

## 2013-02-10 NOTE — Progress Notes (Signed)
Patient ID: Charlotte [name], female   DOB: 1966-08-26, 47 y.o.   MRN: [id number] 1:1 notes  02/10/2013 @ 2200 D: Patient in bed sleeping. Respiration regular and unlabored. No sign of distress noted at this time A: Medication administered as prescribed. 1:1 observation for safety R: Patient remains asleep. Sitter at bedside. 1:1 continues

## 2013-02-10 NOTE — Progress Notes (Signed)
Recreation Therapy Notes  02/10/2013         Time: 9:30am      Group Topic/Focus: 9:30am  Participation Level: Minimal  Participation Quality: Appropriate  Affect: Blunted, Depressed   Cognitive: Disoriented  Additional Comments: Prior to session starting LRT introduced herself to all patients in the 400 hallway day room. Patient peer refused to interact with LRT, patient became visibly distressed by the interaction between LRT and patient peer. LRT assured her that everything was ok and patient adjusted. Patient chose to participate in group activity. Patient was given a worksheet with a blank face on it. Patient was asked to color on the face the colors that represent the emotions that are most present within her. Patient colored blue around the top of the head. Patient stated "I'm a girl, I haven't shaved in a while." Patient continued to speak, however speech was mumbled and inaudible to LRT. Patient and peer engaged in conversation. LRT was unable to hear the contents of the conversation. Patient groomed patient peer, by petting peer head, fluffing peer beard, and rubbing fingers over peers toe nails. Patient began menstrual cycle during group session. Patient was asked to leave group by member of MHT staff.     [name], LRT/CTRS   [name] L 02/10/2013 1:16 PM

## 2013-02-10 NOTE — Clinical Social Work Note (Signed)
[facility] LCSW Group Therapy  02/10/2013 3:07 PM   Type of Therapy:  Group Therapy  Participation Level:  Active  Participation Quality:  Attentive  Affect:  Appropriate  Cognitive:  Appropriate  Insight:  Improving  Engagement in Therapy:  Engaged  Modes of Intervention:  Clarification, Education, Exploration and Socialization  Summary of Progress/Problems: Today's group focused on relapse prevention.  We defined the term, and then brainstormed on ways to prevent relapse.  Charlotte [name]'s involvement in group today ranged from nonsensical ["Do snowmen have to eyes?"] to insightful ["We have to take responsibility for our behavior to move forward"] with some moments of laughing out loud at nothing in particular mixed in for good measure.  She enjoys the group setting, and stayed within her personal space, unlike this morning.  Much of focus continues to be talking about the homeless and her desire to do more for them.  [name] B 02/10/2013 , 3:07 PM

## 2013-02-10 NOTE — Progress Notes (Signed)
1:1 at 1812   D Candace remains disorganized...inappropriate in her verbal response at times "I want you to talk to my husband because he is part of the problem and I know we can work things out if we just get closer....".She is anxious.at times repeating softly to herself over and over the same phrases. Her startle response is exagerated. She has a diff time processing and cannot attend to one conversation at the time.   A She attmepts to walk into another patient's room, but is redirected by staff.   R Safety is in place and POC cont.

## 2013-02-10 NOTE — Progress Notes (Signed)
Patient ID: Charlotte [name], female   DOB: 07/29/1966, 47 y.o.   MRN: [id number] [facility] MD Progress Note  02/09/2013 2:56 PM Charlotte [name]  MRN:  [id number] Subjective: Met with the patient today 1:1. "I can't stop homelessness, people need my help."  Objective: She continues to be disorganized and intrusive, labile and in need of constant redirection. Charlotte [name] is responding to internal stimulation, and becoming physically close to another patient by wanting to hold hands.  Diagnosis:  Schizoaffective disorder Bipolar type  ADL's:  Intact  Sleep: Fair  Appetite:  Poor  Suicidal Ideation:  Unable to assess Homicidal Ideation:  Unable to assess AEB (as evidenced by): patient's response, affect, and participation in unit programming.  Psychiatric Specialty Exam: Review of Systems  Constitutional: Negative.  Negative for fever, chills, weight loss, malaise/fatigue and diaphoresis.  HENT: Negative for congestion and sore throat.   Eyes: Negative for blurred vision, double vision and photophobia.  Respiratory: Negative for cough, shortness of breath and wheezing.   Cardiovascular: Negative for chest pain, palpitations and PND.  Gastrointestinal: Negative for heartburn, nausea, vomiting, abdominal pain, diarrhea and constipation.  Musculoskeletal: Negative for myalgias, joint pain and falls.  Neurological: Negative for dizziness, tingling, tremors, sensory change, speech change, focal weakness, seizures, loss of consciousness, weakness and headaches.  Endo/Heme/Allergies: Negative for polydipsia. Does not bruise/bleed easily.  Psychiatric/Behavioral: Negative for depression, suicidal ideas, hallucinations, memory loss and substance abuse. The patient is not nervous/anxious and does not have insomnia.     Blood pressure 107/75, pulse 93, temperature 97.4 F (36.3 C), temperature source Oral, resp. rate 18, height 5\' 5"  (1.651 m), weight 81.647 kg (180 lb).Body mass index is 29.95 kg/(m^2).   General Appearance: Disheveled with poor hygiene  Eye Contact::  Fair  Speech:  Rambles and is tangential and loose  Volume:  Normal  Mood:  Anxious and Depressed  Affect:  Full range from flat to labile to tearful  Thought Process:  Disorganized  Orientation:  NA  Thought Content:  Hallucinations: patient appears to be responding to internal stimuli  Suicidal Thoughts:  Unable to assess  Homicidal Thoughts: Unable to assess  Memory:  NA  Judgement:  Impaired  Insight:  Lacking  Psychomotor Activity:  Psychomotor Retardation  Concentration:  Poor  Recall:  Poor  Akathisia:  No  Handed:  Right  AIMS (if indicated):     Assets:  Desire for Improvement  Sleep:  Number of Hours: 3.25   Current Medications: Current Facility-Administered Medications  Medication Dose Route Frequency Provider Last Rate Last Dose  . acetaminophen (TYLENOL) tablet 650 mg  650 mg Oral Q6H PRN Sanjuana Kava, NP      . alum & mag hydroxide-simeth (MAALOX/MYLANTA) 200-200-20 MG/5ML suspension 30 mL  30 mL Oral Q4H PRN Sanjuana Kava, NP      . benztropine (COGENTIN) tablet 1 mg  1 mg Oral BID Verne Spurr, PA-C   1 mg at 02/05/13 0802  . fluPHENAZine (PROLIXIN) tablet 10 mg  10 mg Oral QHS Verne Spurr, PA-C   10 mg at 02/04/13 2129  . hydrOXYzine (ATARAX/VISTARIL) tablet 25 mg  25 mg Oral Q4H PRN Sanjuana Kava, NP   25 mg at 02/04/13 1814  . magnesium hydroxide (MILK OF MAGNESIA) suspension 30 mL  30 mL Oral Daily PRN Sanjuana Kava, NP      . OLANZapine zydis (ZYPREXA) disintegrating tablet 30 mg  30 mg Oral QHS Verne Spurr, PA-C      . valproic  acid (DEPAKENE) 250 MG capsule 250 mg  250 mg Oral QHS Verne Spurr, PA-C   250 mg at 02/04/13 2129  . venlafaxine XR (EFFEXOR-XR) 24 hr capsule 37.5 mg  37.5 mg Oral q morning - 10a Verne Spurr, PA-C   37.5 mg at 02/05/13 1610    Lab Results: No results found for this or any previous visit (from the past 48 hour(s)).  Physical Findings: AIMS: Facial and  Oral Movements Muscles of Facial Expression: None, normal Lips and Perioral Area: None, normal Jaw: None, normal Tongue: None, normal,Extremity Movements Upper (arms, wrists, hands, fingers): None, normal Lower (legs, knees, ankles, toes): None, normal, Trunk Movements Neck, shoulders, hips: None, normal, Overall Severity Severity of abnormal movements (highest score from questions above): None, normal Incapacitation due to abnormal movements: None, normal Patient's awareness of abnormal movements (rate only patient's report): No Awareness, Dental Status Current problems with teeth and/or dentures?: No Does patient usually wear dentures?: No  CIWA:  CIWA-Ar Total: 0 COWS:  COWS Total Score: 0  Treatment Plan Summary: Daily contact with patient to assess and evaluate symptoms and progress in treatment Medication management  Plan: 1. Continue crisis management and stabilization. 2. Medication management to reduce current symptoms to base line and improve patient's overall level of functioning 3. Treat health problems as indicated. 4. Develop treatment plan to decrease risk of relapse upon discharge and the need for readmission. 5. Psycho-social education regarding relapse prevention and self care. 6. Health care follow up as needed for medical problems. 7. Continue home medications where appropriate. 8. Will increase Zyprexa to 15 mg  Twice daily.  (this did not get ordered yesterday but has been today.) 9.Will continue prolixin 10 mg q AM and 10mg  at hs. 10. Will hold on  increase Depakene as she becomes more stable. 11. She will need close observation due to her wandering and is placed on a 1:1 due to her need for redirection. Medical Decision Making Problem Points:  Established problem, worsening (2) and Review of psycho-social stressors (1) Data Points:  Review or order clinical lab tests (1) Review or order medicine tests (1) Review and summation of old records (2) Review of  medication regiment & side effects (2)  I certify that inpatient services furnished can reasonably be expected to improve the patient's condition.   Rona Ravens. Dontre Laduca RPAC 12:47 PM 02/10/2013

## 2013-02-10 NOTE — Progress Notes (Signed)
1045  D Treatment team discussion centered around pt's behavior of constantly wandering in and out of other pt's rooms, despite redirection from staff when this is observed. Pt is disorganized, she does not make logical sense when she talks and she  Is very labile, i e one minute she's talking and

## 2013-02-10 NOTE — Progress Notes (Signed)
[facility] LCSW Aftercare Discharge Planning Group Note  02/10/2013 10:46 AM  Participation Quality:  Intrusive  Affect:  Blunted and Tearful  Cognitive:  Disorganized  Insight:  Distracting  Engagement in Group:  Distracting  Modes of Intervention:  Discussion, Limit-setting and Reality Testing  Summary of Progress/Problems: I found Danielys with on her side on the floor with her head on the couch between 2 nursing students.  I asked her to find a seat, and threatened to have her removed from group if she chose not to find a seat.  She chose to stay.  Became tearful at one point around the fact that "I cannot stop homelessness."    [name] B 02/10/2013, 10:46 AM

## 2013-02-11 NOTE — Progress Notes (Signed)
1:1 FOR 1800 ENTERED AT 1900   D Pt has been resting quietly. 1:1 sitter at her side. At 1534, she was given prn doses of vistaril 25 mg po and ativan 1 mg po, because she got quite agitated, laughing very very loudly ( at nothing in partcular ).   A She has been sleeping quietly , off and on, since then.   R POC cont.

## 2013-02-11 NOTE — Progress Notes (Signed)
1:1 D Charlotte [name] remains on 1:1 due to intrusiveness and impulsivity. She requires constant redirection from the staff , she frequently will sit down on the 400 hall floor, right in the middle of traffic and the main flow of patients and staff. SHe has no insight. She speaks in a whisper-like voice and cannot maintain conversation. A SHe has diff taking her meds and has to be prompted to get them down completely...without trying to do csomething else before she finishes. R Safety is in place and POC cont.

## 2013-02-11 NOTE — Progress Notes (Signed)
Patient ID: Charlotte [name], female   DOB: [birth year], 47 y.o.   MRN: [id number] 1:1 Nursing Notes: The patient is very labile. She has been observed laughing and moments later crying. Her thoughts are disorganized and content is hypersexual. She has asked staff as well as other patients if they are her husband and if she could have sex with them. She was talking, smiling and laughing and then burst into tears stating she was afraid someone was going to rape her and pointed to a female patient who was not even engaged in a conversation with her. Reassurance of her safety reinforced. Medications administered. Compliant with medications. 1:1 maintained for safety. Will continue to monitor.

## 2013-02-11 NOTE — Progress Notes (Signed)
Patient ID: Charlotte [name], female   DOB: 12-12-1966, 47 y.o.   MRN: [id number] 1:1 notes  02/11/2013 @ 0200 D: Patient in bed sleeping. Respiration regular and unlabored. No sign of distress noted at this time A: 1:1 observation for safety R: Patient remains asleep. Sitter at bedside. 1:1 continues

## 2013-02-11 NOTE — Progress Notes (Signed)
Patient ID: Charlotte [name], female   DOB: 04/17/1966, 47 y.o.   MRN: [id number] 1:1 notes  02/11/2013 @ 0600 D: Patient in bed sleeping. Respiration regular and unlabored. No sign of distress noted at this time A: 1:1 observation for safety R: Patient remains asleep. Sitter at bedside. 1:1 continues

## 2013-02-11 NOTE — Progress Notes (Signed)
The focus of this group is to help patients review their daily goal of treatment and discuss progress on daily workbooks. Pt attended the evening group session but was unable to respond to any discussion prompts. Pt asked the Writer "Are you my husband," and when told the Writer was not her husband, replied "Well can you have sex with me anyways?" After this exchange, Pt became tearful and did not speak. Pt's affect during group changed several times from laughing to crying.

## 2013-02-11 NOTE — Progress Notes (Signed)
Adult Psychoeducational Group Note  Date:  02/11/2013 Time:  2000  Group Topic/Focus:  Wrap-Up Group:   The focus of this group is to help patients review their daily goal of treatment and discuss progress on daily workbooks.  Participation Level:  Did Not Attend  Participation Quality:    Affect:    Cognitive:    Insight:   Engagement in Group:    Modes of Intervention:    Additional Comments:  Pt refused to attend wrap-up group this evening.   Aerin Delany A 02/11/2013, 4:39 AM

## 2013-02-11 NOTE — Progress Notes (Signed)
1:1 Nrsg Note D Pt is seen OOB UAL on the 300 hall this morning. She is assisted by her 1:1 sitter. She makes eye contact. She has a surprised expression on her face...she tells this writer that ' I am not afraid of suicide any longer.Marland KitchenMarland KitchenI   Understand it and I know what it means..I am not afraid".. She takes her meds after [facility] encouragement and redirection from this Clinical research associate. SHe stops many times after picking up pill and putting the pill to her lips..   A Pt  takes her meds as scheduled.   R Safety is in place and POC cont.

## 2013-02-11 NOTE — Clinical Social Work Note (Signed)
[facility] Group Notes:  (Clinical Social Work)  02/11/2013     10-11AM  Summary of Progress/Problems:   The main focus of today's process group was for the patient to identify what they wish to work on in their lives to move toward where they want to be.  Motivational Interviewing was utilized to ask the group members what they want to change in their lives.   The patient expressed that she wants her life with her husband [name] to be better.  She then stated "I'm the worst person in the world" and talking about "thieving, lying stealing."  She had considerable thought delays in answering questions.  She was tearful at one point and said her husband will not tell her where she lives.  She said she cannot remember if she has talked to him, stating "I've been here a long time."    Type of Therapy:  Group Therapy - Process   Participation Level:  Active  Participation Quality:  Attentive  Affect:  Tearful and Confused  Cognitive:  Confused  Insight:  Poor  Engagement in Therapy:   Limited  Modes of Intervention:  Education, Teacher, English as a foreign language, Exploration, Discussion, Motivational Interviewing   Ambrose Mantle, LCSW 02/11/2013, 12:59 PM

## 2013-02-11 NOTE — Progress Notes (Signed)
Patient ID: Charlotte [name], female   DOB: 17-May-1966, 47 y.o.   MRN: [id number] [facility] MD Progress Note   Charlotte [name]  MRN:  [id number] Subjective: I like to help people.    Objective: Patient seen and chart reviewed.  She continues to be disorganized and intrusive, labile and in need of constant redirection. Charlotte [name] is responding to internal stimulation, and becoming physically close to another patient by wanting to hold hands.  She is preoccupied in her own thoughts.  She's obsess about her religious belief and sprtual problem.    Diagnosis:  Schizoaffective disorder Bipolar type  ADL's:  Intact  Sleep: Fair  Appetite:  Poor  Suicidal Ideation:  Unable to assess Homicidal Ideation:  Unable to assess AEB (as evidenced by): patient's response, affect, and participation in unit programming.  Psychiatric Specialty Exam: Review of Systems  Constitutional: Negative.  Negative for fever, chills, weight loss, malaise/fatigue and diaphoresis.  HENT: Negative for congestion and sore throat.   Eyes: Negative for blurred vision, double vision and photophobia.  Respiratory: Negative for cough, shortness of breath and wheezing.   Cardiovascular: Negative for chest pain, palpitations and PND.  Gastrointestinal: Negative for heartburn, nausea, vomiting, abdominal pain, diarrhea and constipation.  Musculoskeletal: Negative for myalgias, joint pain and falls.  Neurological: Negative for dizziness, tingling, tremors, sensory change, speech change, focal weakness, seizures, loss of consciousness, weakness and headaches.  Endo/Heme/Allergies: Negative for polydipsia. Does not bruise/bleed easily.  Psychiatric/Behavioral: Please see note.       Blood pressure 107/75, pulse 93, temperature 97.4 F (36.3 C), temperature source Oral, resp. rate 18, height 5\' 5"  (1.651 m), weight 81.647 kg (180 lb).Body mass index is 29.95 kg/(m^2).  General Appearance: Disheveled with poor hygiene  Eye Contact::  Fair   Speech:  Rambles and is tangential and loose  Volume:  Normal  Mood:  Anxious and Depressed  Affect:  Full range from flat to labile to tearful  Thought Process:  Disorganized  Orientation:  NA  Thought Content:  Hallucinations: patient appears to be responding to internal stimuli  Suicidal Thoughts:  Unable to assess  Homicidal Thoughts: Unable to assess  Memory:  NA  Judgement:  Impaired  Insight:  Lacking  Psychomotor Activity:  Psychomotor Retardation  Concentration:  Poor  Recall:  Poor  Akathisia:  No  Handed:  Right  AIMS (if indicated):     Assets:  Desire for Improvement  Sleep:  Number of Hours: 3.25   Current Medications: Current Facility-Administered Medications  Medication Dose Route Frequency Provider Last Rate Last Dose  . acetaminophen (TYLENOL) tablet 650 mg  650 mg Oral Q6H PRN Sanjuana Kava, NP      . alum & mag hydroxide-simeth (MAALOX/MYLANTA) 200-200-20 MG/5ML suspension 30 mL  30 mL Oral Q4H PRN Sanjuana Kava, NP      . benztropine (COGENTIN) tablet 1 mg  1 mg Oral BID Verne Spurr, PA-C   1 mg at 02/05/13 0802  . fluPHENAZine (PROLIXIN) tablet 10 mg  10 mg Oral QHS Verne Spurr, PA-C   10 mg at 02/04/13 2129  . hydrOXYzine (ATARAX/VISTARIL) tablet 25 mg  25 mg Oral Q4H PRN Sanjuana Kava, NP   25 mg at 02/04/13 1814  . magnesium hydroxide (MILK OF MAGNESIA) suspension 30 mL  30 mL Oral Daily PRN Sanjuana Kava, NP      . OLANZapine zydis (ZYPREXA) disintegrating tablet 30 mg  30 mg Oral QHS Verne Spurr, PA-C      .  valproic acid (DEPAKENE) 250 MG capsule 250 mg  250 mg Oral QHS Verne Spurr, PA-C   250 mg at 02/04/13 2129  . venlafaxine XR (EFFEXOR-XR) 24 hr capsule 37.5 mg  37.5 mg Oral q morning - 10a Verne Spurr, PA-C   37.5 mg at 02/05/13 1610    Lab Results: No results found for this or any previous visit (from the past 48 hour(s)).  Physical Findings: AIMS: Facial and Oral Movements Muscles of Facial Expression: None, normal Lips and  Perioral Area: None, normal Jaw: None, normal Tongue: None, normal,Extremity Movements Upper (arms, wrists, hands, fingers): None, normal Lower (legs, knees, ankles, toes): None, normal, Trunk Movements Neck, shoulders, hips: None, normal, Overall Severity Severity of abnormal movements (highest score from questions above): None, normal Incapacitation due to abnormal movements: None, normal Patient's awareness of abnormal movements (rate only patient's report): No Awareness, Dental Status Current problems with teeth and/or dentures?: No Does patient usually wear dentures?: No  CIWA:  CIWA-Ar Total: 0 COWS:  COWS Total Score: 0  Treatment Plan  Continue crisis management and stabilization. Continue medication management to reduce symptoms to baseline and improved patient's overall level of functioning .  Treat physical and medical problems as indicated . Continued to work on to group and her degree of risk of relapse upon discharge and he tolerated [place].  Continue psychosocial education regarding relapse prevention and severe.  Continue to monitor vitals .  No change in her medication dose at this time.  Patient will require one-to-one observation due to her disorganized thinking and behavior.   Medical Decision Making Problem Points:  Established problem, worsening (2) and Review of psycho-social stressors (1) Data Points:  Review or order clinical lab tests (1) Review or order medicine tests (1) Review and summation of old records (2) Review of medication regiment & side effects (2)  I certify that inpatient services furnished can reasonably be expected to improve the patient's condition.   Kathryne Sharper, MD 10:41 AM 02/11/2013

## 2013-02-12 NOTE — Progress Notes (Signed)
Patient ID: Charlotte [name], female   DOB: [birth year], 47 y.o.   MRN: [id number] Late entry 1:1 nursing note: The patient slept well last night. Upon waking up attended to so ADL's with the urging of her sitter. Was compliant with getting her vital signs in the dayroom. Thoughts are still disorganized. 1:1 maintained for safety. Will continue to monitor.

## 2013-02-12 NOTE — Progress Notes (Signed)
Nutrition Brief Note  Patient identified on the Malnutrition Screening Tool (MST) Report  Body mass index is 29.95 kg/(m^2). Patient meets criteria for overweight based on current BMI.   Wt Readings from Last 10 Encounters:  02/03/13 180 lb (81.647 kg)  02/01/13 191 lb (86.637 kg)  01/30/13 191 lb 9.6 oz (86.909 kg)  01/01/12 201 lb (91.173 kg)  09/08/11 204 lb (92.534 kg)  11/26/08 220 lb (99.791 kg)  06/04/08 214 lb (97.07 kg)  03/05/08 222 lb (100.699 kg)  12/05/07 212 lb (96.163 kg)  09/05/07 218 lb (98.884 kg)    Noted continued slow weight loss for the past 1 1/2 years.  UBW recently per husband is 190 lbs.  Spoke with tech who stated that patient is eating very well.  Not yet able to leave the unit to go to the cafe.    Current diet order is regular, patient is consuming approximately 100% of meals at this time. Labs and medications reviewed.   No nutrition interventions warranted at this time. If nutrition issues arise, please consult RD.   Oran Rein, RD, LDN Clinical Inpatient Dietitian Pager:  [phone] Weekend and after hours pager:  [phone]

## 2013-02-12 NOTE — Clinical Social Work Note (Signed)
[facility] Group Notes:  (Clinical Social Work)  02/12/2013   11:15-11:45AM  Summary of Progress/Problems:  The main focus of today's process group was to listen to a variety of genres of music and to identify that different types of music provoke different responses.  The patient then was able to identify personally what was soothing for them, as well as energizing.  Handouts were used to record feelings evoked, as well as how patient can personally use this knowledge in sleep habits, with depression, and with other symptoms.  The patient expressed enjoyment of many kinds of music played, and responded with extreme physical motions several times, such as pretending to play a viola or wanting to get up and dance to the rap music.  Her one-to-one technician was instrumental in containing her behaviors.  When asked how different types of music made her feel, her responses were disconnected.  For instance, gospel music made her see a truck.   Type of Therapy:  Music Therapy with processing done  Participation Level:  Active  Participation Quality:  Attentive and Sharing  Affect:  Excited and Flat  Cognitive:  Disorganized and Confused  Insight:  Developing/Improving  Engagement in Therapy:  Engaged  Modes of Intervention:   Socialization, Support and Processing, Exploration, Education, Rapport Building   [employer], LCSW 02/12/2013, 12:30 PM

## 2013-02-12 NOTE — Progress Notes (Signed)
Patient ID: Charlotte [name], female   DOB: [birth year], 47 y.o.   MRN: [id number] 1:1 Nursing Note: The patient is resting in bed with eyes closed. No distress noted. 1:1 maintained for safety. Will continue to monitor.

## 2013-02-12 NOTE — [facility] Counselor (Signed)
Adult Comprehensive Assessment  Patient ID: Charlotte [name], female   DOB: 10-23-1966, 47 y.o.   MRN: [id number]  Information Source: Information source: Patient  Current Stressors:  Educational / Learning stressors: "Can be stressful if it's piled up too high.  We were supposed to give all those books to Charlotte [name] to teach them how to read." Employment / Job issues: "It can be.  I had to figure out the plans, but I wanted other people to implement them for me." Family Relationships: "Stuff that they didn't tell me." Financial / Lack of resources (include bankruptcy): "My husband has to work so hard, but that is caused a lot by the people he works for,  but most of them are Scientist, forensic and do the best they can for [place]." Housing / Lack of housing: "Charlotte [name] and I share it together, and I don't know how the bills are distributed." Physical health (include injuries & life threatening diseases): Denies Social relationships: Denies Substance abuse: Denies Bereavement / Loss: Denies  Living/Environment/Situation:  Living Arrangements: Spouse/significant other Living conditions (as described by patient or guardian): Initially, cannot respond to this question, then later in interview states she lives with her husband Charlotte [name] and her Systems developer How long has patient lived in current situation?: Does not know What is atmosphere in current home: Loving;Supportive  Family History:  Marital status: Married Number of Years Married: 12 years, able to recite the date of her anniversary What types of issues is patient dealing with in the relationship?: Very disorganized, suspicious of her husband initially.  Later stated he helps her and works hard.  Started crying. Does patient have children?: No  Childhood History:  Additional childhood history information:  "The staff here raised me."  Patient was unable to talk at all about who raised her Does patient have siblings?: Yes Number of Siblings:  (1 brother  Charlotte [name] and 1 sister-in-law Charlotte [name]) Description of patient's current relationship with siblings: CSW asked "Good relationship?"  Patient replied, " I guess so." Did patient suffer any verbal/emotional/physical/sexual abuse as a child?:  ("I don't know")  Education:  Highest grade of school patient has completed: 1-1/2 years at [employer], graduated from [employer] Currently a Consulting civil engineer?: No Learning disability?: Yes What learning problems does patient have?: Always daydreaming or sleeping, but really really smart  Employment/Work Situation:   Employment situation: On disability Why is patient on disability: "Because of love" How long has patient been on disability: Does not know Has patient ever been in the [employer]?: No Has patient ever served in combat?: No  Financial Resources:    [employer] check, although not sure why.  Unable to talk further about her financial resources.  Talked about her and husband sharing the expenses of the house, but not sure how the bills are distributed.  Says she believes her social security check paid for the house except for $5.  Alcohol/Substance Abuse:   What has been your use of drugs/alcohol within the last 12 months?: None Alcohol/Substance Abuse Treatment Hx: Denies past history  Social Support System:   Describe Community Support System: Does not know Type of faith/religion: Charlotte [name] How does patient's faith help to cope with current illness?: "Yes, it helps me when I get out of order and don't follow the rules."  Leisure/Recreation:   Leisure and Hobbies: "Play with oranges, squish them, juggle them.  Giggle and laugh and carry on like a kid."  Strengths/Needs:   What things does the patient do well?:  Really smart, helping other people when they'll let her In what areas does patient struggle / problems for patient: "Being myself.  I struggle with loud noises.  Am I still a girl?"  Discharge Plan:   Does patient  have access to transportation?: "Yes, Charlotte [name] will pick me up" Will patient be returning to same living situation after discharge?: Yes Does patient have financial barriers related to discharge medications?: No  Summary/Recommendations:   Summary and Recommendations (to be completed by the evaluator): This is a 47yo Caucasian female who is labile and disorganized throughout the interview, switching quickly from laughter to tears, unable to remember things, but then could remember later in the conversation.  She was hospitalized with increased paranoia toward husband, and has recently had to leave her psychiatrist due to insurance changes.  She does plan to return to live with her husband, needs a provider, she states, and needs her medication to work.  She would benefit from safety monitoring, medication evaluation, psychoeducation, group therapy, and discharge planning to link with ongoing resources.   Charlotte [name]. 02/12/2013

## 2013-02-12 NOTE — Progress Notes (Signed)
Patient ID: Charlotte [name], female   DOB: 1966/10/15, 47 y.o.   MRN: [id number]  1:1 Nursing Note: The patient is resting in bed with her eyes open. Speaks in a childlike voice when talking. Remains religiously and sexually preoccupied. Thoughts are disorganized and not relevant to the topic of conversation. 1:1 maintained for safety. Will continue to monitor.

## 2013-02-12 NOTE — Progress Notes (Signed)
Pt is guarded and preoccupied. When writer offered medications, pt started pulling her pants down. She also tried to sit close to a female pt on the unit. Pt requires constant redirection and is monitored 1:1. She took medication with promptings. Safety maintained on the unit.

## 2013-02-12 NOTE — Progress Notes (Signed)
Patient ID: Charlotte [name], female   DOB: Jul 20, 1966, 47 y.o.   MRN: [id number] [facility] MD Progress Note  Subjective: I'm here due to love with my God     Objective: Patient seen and chart reviewed.  Patient is pleasant cooperative but remains disorganized and labile.  She remains on one-to-one watch for her intrusive and disorganized thinking.  She slept better and she is compliant with the medication.  She denies any side effects.  She continues to respond to internal stimuli however there has been no management problem.  She requires redirection for unit routine as patient tends to hold hand and get very close to other patients.  She remains religiously preoccupied .    Diagnosis:  Schizoaffective disorder Bipolar type  ADL's:  Intact  Sleep: Fair  Appetite:  Poor  Suicidal Ideation:  Denies.   Homicidal Ideation:  Denies.   AEB (as evidenced by): patient's response, affect, and participation in unit programming.  Review of Systems  Constitutional: Negative.   Neurological: Positive for headaches. Negative for dizziness, tingling, tremors, sensory change, speech change and seizures.  Psychiatric/Behavioral: Positive for hallucinations. The patient is nervous/anxious.        Disorganized thinking   Blood pressure 107/75, pulse 93, temperature 97.4 F (36.3 C), temperature source Oral, resp. rate 18, height 5\' 5"  (1.651 m), weight 81.647 kg (180 lb).Body mass index is 29.95 kg/(m^2).  General Appearance: Disheveled with poor hygiene  Eye Contact::  Fair  Speech:  Rambles and is tangential and loose  Volume:  Normal  Mood:  Anxious and Depressed  Affect:  Full range from flat to labile to tearful  Thought Process:  Disorganized,    Orientation:  NA  Thought Content:  Hallucinations: patient appears to be responding to internal stimuli  Suicidal Thoughts:  Denies.    Homicidal Thoughts: Denies.    Memory:  NA  Judgement:  Impaired  Insight:  Lacking  Psychomotor Activity:   Psychomotor Retardation, thought blocking   Concentration:  Poor  Recall:  Poor  Akathisia:  No  Handed:  Right  AIMS (if indicated):     Assets:  Desire for Improvement  Sleep:  Number of Hours: 3.25   Current Medications: Current Facility-Administered Medications  Medication Dose Route Frequency Provider Last Rate Last Dose  . acetaminophen (TYLENOL) tablet 650 mg  650 mg Oral Q6H PRN Sanjuana Kava, NP      . alum & mag hydroxide-simeth (MAALOX/MYLANTA) 200-200-20 MG/5ML suspension 30 mL  30 mL Oral Q4H PRN Sanjuana Kava, NP      . benztropine (COGENTIN) tablet 1 mg  1 mg Oral BID Verne Spurr, PA-C   1 mg at 02/05/13 0802  . fluPHENAZine (PROLIXIN) tablet 10 mg  10 mg Oral QHS Verne Spurr, PA-C   10 mg at 02/04/13 2129  . hydrOXYzine (ATARAX/VISTARIL) tablet 25 mg  25 mg Oral Q4H PRN Sanjuana Kava, NP   25 mg at 02/04/13 1814  . magnesium hydroxide (MILK OF MAGNESIA) suspension 30 mL  30 mL Oral Daily PRN Sanjuana Kava, NP      . OLANZapine zydis (ZYPREXA) disintegrating tablet 30 mg  30 mg Oral QHS Verne Spurr, PA-C      . valproic acid (DEPAKENE) 250 MG capsule 250 mg  250 mg Oral QHS Verne Spurr, PA-C   250 mg at 02/04/13 2129  . venlafaxine XR (EFFEXOR-XR) 24 hr capsule 37.5 mg  37.5 mg Oral q morning - 10a Verne Spurr, PA-C  37.5 mg at 02/05/13 1610    Lab Results: No results found for this or any previous visit (from the past 48 hour(s)).  Physical Findings: AIMS: Facial and Oral Movements Muscles of Facial Expression: None, normal Lips and Perioral Area: None, normal Jaw: None, normal Tongue: None, normal,Extremity Movements Upper (arms, wrists, hands, fingers): None, normal Lower (legs, knees, ankles, toes): None, normal, Trunk Movements Neck, shoulders, hips: None, normal, Overall Severity Severity of abnormal movements (highest score from questions above): None, normal Incapacitation due to abnormal movements: None, normal Patient's awareness of abnormal  movements (rate only patient's report): No Awareness, Dental Status Current problems with teeth and/or dentures?: No Does patient usually wear dentures?: No  CIWA:  CIWA-Ar Total: 0 COWS:  COWS Total Score: 0  Treatment Plan  Continue crisis management and stabilization. Continue medication management to reduce symptoms to baseline and improved patient's overall level of functioning .  Treat physical and medical problems as indicated . Continued to work on to group and her degree of risk of relapse upon discharge and he tolerated [facility].  Continue psychosocial education regarding relapse prevention and severe.  Continue to monitor vitals .  No change in her medication dose at this time.  Patient will require one-to-one observation due to her disorganized thinking and behavior.   Medical Decision Making Problem Points:  Established problem, worsening (2) and Review of psycho-social stressors (1) Data Points:  Review or order clinical lab tests (1) Review or order medicine tests (1) Review and summation of old records (2) Review of medication regiment & side effects (2)  I certify that inpatient services furnished can reasonably be expected to improve the patient's condition.   Kathryne Sharper, MD 10:07 AM 02/12/2013

## 2013-02-13 NOTE — Progress Notes (Signed)
Patient ID: Charlotte [name], female   DOB: 1966/02/13, 47 y.o.   MRN: [id number] 1:1 observational note:  Patient remains with disorganized thinking.  She is still intrusive on the hallway requiring redirection from staff.  She denies SI/HI/AVH.  She is compliant with her medications.  She stated that she did not like taking her medications, however, staff reiterated to her how important it was that she continue taking it for her continued well being.  Patient attends group with redirection.  Continue to monitor medication management and MD orders.  Continue 1:1 observation for behavioral redirection and intrusive behaviors.

## 2013-02-13 NOTE — Progress Notes (Signed)
[facility] LCSW Aftercare Discharge Planning Group Note  02/13/2013 2:46 PM  Participation Quality:  Appropriate  Affect:  Blunted  Cognitive:  Oriented  Insight:  Limited  Engagement in Group:  Limited  Modes of Intervention:  Discussion, Exploration and Socialization  Summary of Progress/Problems: Charlotte [name] reported that she had  A good weekend.  Stated that her husband visited and they had a nice time together.  No other needs or requests.  [name] B 02/13/2013, 2:46 PM

## 2013-02-13 NOTE — Progress Notes (Signed)
Patient ID: Charlotte [name], female   DOB: [birth year], 47 y.o.   MRN: [id number] 1:1 Nursing Note: The patient is resting in bed with eyes closed. No distress noted. 1:1 maintained for safety will continue to monitor.

## 2013-02-13 NOTE — Progress Notes (Signed)
Recreation Therapy Notes   Date: 02.24.2014 Time: 9:30am Location: [address] Day Room      Group Topic/Focus: Decision Making  Participation Level: Minimal  Participation Quality: Redirectable  Affect: Flat to Euthymic  Cognitive: Oriented for a portion of the session.    Additional Comments: Patient played "Choices in a Jar" game provides either or choice, patient must chose one provided option. Patient arrived approximately 5 minutes late to group. Patient asked LRT if she could sit on the floor next to LRT. LRT & 1:1 MHT encouraged patient to sit on furniture. Patient did so for approximately 10 minutes, then patient moved to the floor. Peer explained group activity to patient. Peer asked patient a question from "Choice in a Jar" patient stared blankly at peer and at LRT. Patient did not respond to question. Patient stared blankly at the floor or wall in front of her until 9:55. At 9:55am patient began interacting in group activity. Patient answered questions that were asked of her. When asked if she would rather live without music or live with out computers. Patient stated "Computers because of the reasons I hold deep inside me." Peer drew a card that asked if patient would rather have to crawl everywhere or while stationary have to stand on their hands. Patient stated "Would you crawl on your knees for the lord?" Patient then began crawling around on the floor. LRT & 1:1 MHT encouraged patient to return to group activity. Patient crawled back to center of the room while stating "This is how leopards do in cages." At this point another member of MHT staff came into group session and asked patient to leave with her. Patient exited day room without incident.   Charlotte [name], LRT/CTRS   [name] 02/13/2013 12:45 PM

## 2013-02-13 NOTE — Clinical Social Work Note (Signed)
  Type of Therapy: Process Group Therapy  Participation Level:  Minimal  Participation Quality:  Attentive  Affect:  Blunted  Cognitive:  Disorganized  Insight:  Limited  Engagement in Group:  Limited  Engagement in Therapy:  Limited  Modes of Intervention:  Activity, Clarification, Education, Problem-solving and Support  Summary of Progress/Problems: Today's group addressed the issue of overcoming obstacles.  Patients were asked to identify their biggest obstacle post d/c that stands in the way of their on-going success, and then problem solve as to how to manage this.  Charlotte [name] shared that her biggest obstacle is watching her personal boundaries with others, which was very insightful.  That is the main reason she is on a 1:1.  However, when questioned more about this, she began talking about how she was sure she was pregnant, even though everyone is telling her she is not.  This has happened in group before where she comes up with a perfectly logical and insightful idea followed by delusional thinking.       Charlotte [name] 02/13/2013   2:39 PM

## 2013-02-13 NOTE — Progress Notes (Signed)
Patient ID: Charlotte [name], female   DOB: 23-May-1966, 47 y.o.   MRN: [id number] 1:1 observational note:  Patient has been cooperative.  She remains with disorganized thoughts.  At times, she has to be asked to get out of the floor.  She is redirectable at this time.  She is intrusive with others on the hall.  Continue to observe behavior and redirect as needed.

## 2013-02-13 NOTE — Progress Notes (Signed)
Patient ID: Charlotte [name], female   DOB: 11/11/1966, 47 y.o.   MRN: [id number] 1:1 observational note:  Patient visited with her husband and her mother at lunch.  She remains with disorganized thinking.  Her husband attempted to talk to her about her dog Lawanna Kobus in which she become very happy.  Patient's husband was inquiring about an appointment that was set up for tomorrow with her outpatient provider.  I explained to him I was not aware of discharge plans at this time, but to check with the case manager.  Patient does have periods of lucidity at times and is responding to treatment.  Continue 1:1 observation for redirection for intrusiveness.

## 2013-02-13 NOTE — Progress Notes (Signed)
The focus of this group is to help patients review their daily goal of treatment and discuss progress on daily workbooks. Pt attended the evening group session but did not respond to any prompts from the Writer. When asked how her day was or about her needs, Pt responded "I don't know." Pt's affect changed throughout the group, ranging from sad near crying to very happy, smiling and laughing.

## 2013-02-13 NOTE — Progress Notes (Signed)
Patient ID: Charlotte [name], female   DOB: May 10, 1966, 47 y.o.   MRN: [id number] [facility] MD Progress Note  Subjective:"When are you going to let me heal that knee? Can I pray for you about it?" Charlotte [name] takes my hand and launches into a long rambling prayer about a patient, and other things, then giggles. Objective: Patient seen and chart reviewed.  Patient is pleasant cooperative but remains disorganized and labile.  She remains on one-to-one watch for her intrusive and disorganized thinking.   Saide asked to pray for this provider's knee, but just seemed to want to hold my hand today.  She continues to be confused.  Diagnosis:  Schizoaffective disorder Bipolar type  ADL's:  Intact  Sleep: Fair  Appetite:  Poor  Suicidal Ideation:  Denies.   Homicidal Ideation:  Denies.   AEB (as evidenced by): patient's response, affect, and participation in unit programming.  Review of Systems  Constitutional: Negative.  Negative for fever, chills, weight loss, malaise/fatigue and diaphoresis.  HENT: Negative for congestion and sore throat.   Eyes: Negative for blurred vision, double vision and photophobia.  Respiratory: Negative for cough, shortness of breath and wheezing.   Cardiovascular: Negative for chest pain, palpitations and PND.  Gastrointestinal: Negative for heartburn, nausea, vomiting, abdominal pain, diarrhea and constipation.  Musculoskeletal: Negative for myalgias, joint pain and falls.  Neurological: Positive for headaches. Negative for dizziness, tingling, tremors, sensory change, speech change, focal weakness, seizures, loss of consciousness and weakness.  Endo/Heme/Allergies: Negative for polydipsia. Does not bruise/bleed easily.  Psychiatric/Behavioral: Positive for hallucinations. Negative for depression, suicidal ideas, memory loss and substance abuse. The patient is nervous/anxious. The patient does not have insomnia.        Disorganized thinking   Blood pressure 107/75, pulse  93, temperature 97.4 F (36.3 C), temperature source Oral, resp. rate 18, height 5\' 5"  (1.651 m), weight 81.647 kg (180 lb).Body mass index is 29.95 kg/(m^2).  General Appearance: Disheveled with poor hygiene  Eye Contact::  Fair  Speech:  Rambles and is tangential and loose  Volume:  Normal  Mood:  Anxious and Depressed  Affect:  Full range from flat to smiling and giggling  Thought Process:  Disorganized,    Orientation:  NA  Thought Content:  Hallucinations: patient appears to be responding to internal stimuli  Suicidal Thoughts:  Denies.    Homicidal Thoughts: Denies.    Memory:  NA  Judgement:  Impaired  Insight:  Lacking  Psychomotor Activity:  Psychomotor Retardation,   Concentration:  Poor  Recall:  Poor  Akathisia:  No  Handed:  Right  AIMS (if indicated):     Assets:  Desire for Improvement  Sleep:  Number of Hours: 3.25   Current Medications: Current Facility-Administered Medications  Medication Dose Route Frequency Provider Last Rate Last Dose  . acetaminophen (TYLENOL) tablet 650 mg  650 mg Oral Q6H PRN Sanjuana Kava, NP      . alum & mag hydroxide-simeth (MAALOX/MYLANTA) 200-200-20 MG/5ML suspension 30 mL  30 mL Oral Q4H PRN Sanjuana Kava, NP      . benztropine (COGENTIN) tablet 1 mg  1 mg Oral BID Verne Spurr, PA-C   1 mg at 02/05/13 0802  . fluPHENAZine (PROLIXIN) tablet 10 mg  10 mg Oral QHS Verne Spurr, PA-C   10 mg at 02/04/13 2129  . hydrOXYzine (ATARAX/VISTARIL) tablet 25 mg  25 mg Oral Q4H PRN Sanjuana Kava, NP   25 mg at 02/04/13 1814  . magnesium hydroxide (MILK  OF MAGNESIA) suspension 30 mL  30 mL Oral Daily PRN Sanjuana Kava, NP      . OLANZapine zydis (ZYPREXA) disintegrating tablet 30 mg  30 mg Oral QHS Verne Spurr, PA-C      . valproic acid (DEPAKENE) 250 MG capsule 250 mg  250 mg Oral QHS Verne Spurr, PA-C   250 mg at 02/04/13 2129  . venlafaxine XR (EFFEXOR-XR) 24 hr capsule 37.5 mg  37.5 mg Oral q morning - 10a Verne Spurr, PA-C   37.5 mg  at 02/05/13 1610    Lab Results: No results found for this or any previous visit (from the past 48 hour(s)).  Physical Findings: AIMS: Facial and Oral Movements Muscles of Facial Expression: None, normal Lips and Perioral Area: None, normal Jaw: None, normal Tongue: None, normal,Extremity Movements Upper (arms, wrists, hands, fingers): None, normal Lower (legs, knees, ankles, toes): None, normal, Trunk Movements Neck, shoulders, hips: None, normal, Overall Severity Severity of abnormal movements (highest score from questions above): None, normal Incapacitation due to abnormal movements: None, normal Patient's awareness of abnormal movements (rate only patient's report): No Awareness, Dental Status Current problems with teeth and/or dentures?: No Does patient usually wear dentures?: No  CIWA:  CIWA-Ar Total: 0 COWS:  COWS Total Score: 0  Treatment Plan  Continue crisis management and stabilization. Continue medication management to reduce symptoms to baseline and improved patient's overall level of functioning .  Treat physical and medical problems as indicated . Continued to work on to group and her degree of risk of relapse upon discharge and he tolerated [facility].  Continue psychosocial education regarding relapse prevention and severe.  Continue to monitor vitals .  No change in her medication dose at this time.  Patient will require one-to-one observation due to her disorganized thinking and behavior.   Medical Decision Making Problem Points:  Established problem, worsening (2) and Review of psycho-social stressors (1) Data Points:  Review or order clinical lab tests (1) Review or order medicine tests (1) Review and summation of old records (2) Review of medication regiment & side effects (2)  I certify that inpatient services furnished can reasonably be expected to improve the patient's condition.   Rona Ravens. Melisssa Donner RPAC 2:10 PM 02/13/2013

## 2013-02-14 MED ORDER — FLUPHENAZINE HCL 5 MG PO TABS
15.0000 mg | ORAL_TABLET | Freq: Two times a day (BID) | ORAL | Status: DC
  Administered 2013-02-14 – 2013-02-23 (×18): 15 mg via ORAL
  Filled 2013-02-14 (×12): qty 1
  Filled 2013-02-14: qty 60
  Filled 2013-02-14 (×5): qty 1
  Filled 2013-02-14: qty 60
  Filled 2013-02-14 (×3): qty 1

## 2013-02-14 NOTE — Progress Notes (Signed)
See 1:1 progress note. 

## 2013-02-14 NOTE — Progress Notes (Signed)
Psychoeducational Group Note  Date:  02/14/2013 Time:  2000  Group Topic/Focus:  Wrap-Up Group:   The focus of this group is to help patients review their daily goal of treatment and discuss progress on daily workbooks.  Participation Level: Did Not Attend  Participation Quality:  Not Applicable  Affect:  Not Applicable  Cognitive:  Not Applicable  Insight:  Not Applicable  Engagement in Group: Not Applicable  Additional Comments:  Pt did not attend.  Christ Kick 02/14/2013, 10:25 PM

## 2013-02-14 NOTE — Progress Notes (Signed)
Adult Psychoeducational Group Note  Date:  02/14/2013 Time: 0930 Group Topic/Focus:  Recovery Goals:   The focus of this group is to identify appropriate goals for recovery and establish a plan to achieve them.  Participation Level:  None  Participation Quality:  Intrusive and Redirectable  Affect:  Blunted  Cognitive:  Disorganized and Confused  Insight: None  Engagement in Group:  Distracting, Monopolizing and Off Topic  Modes of Intervention:  Education  Additional Comments:  Pt was very disruptive in group and had to be asked to excused herself from group.  Shellie Rogoff E 02/14/2013, 3:22 PM

## 2013-02-14 NOTE — Progress Notes (Signed)
D:  Patient sitting in her room.  She stood up and asked all present to pray with her.  Her prayer was a bit rambling and disjointed, but she did not carry on for a long time.  She continues to be somewhat confused.  She did shower this morning.  A:  Allowed patient to pray for a short time.  Reminded her that she was safe here and that she has a sitter she can depend on if she has any needs.   R:  Pleasant and cooperative with staff.  It is very difficult for her to participate in groups and interact with peers.  Patient remains safe on the unit at this time.

## 2013-02-14 NOTE — Clinical Social Work Note (Signed)
[facility] LCSW Group Therapy  02/14/2013 , 12:34 PM   Type of Therapy:  Group Therapy  Participation Level:  Left early  Participation Quality:  Intrusive  Affect:  Blunted  Cognitive: Disorganized  Insight:  Limited  Engagement in Therapy:  None  Modes of Intervention:  Discussion, Exploration and Socialization  Summary of Progress/Problems: Today's group focused on the term Diagnosis.  Participants were asked to define the term, and then pronounce whether it is a negative, positive or neutral term.  Trishelle was troubled by my introduction to the group, and took me to the window to show me what she was talking about.  When I told her I could not see what she was trying to show me, she attempted to hug me, and then left group.  Ida Rogue 02/14/2013 , 12:34 PM

## 2013-02-14 NOTE — Progress Notes (Addendum)
Patient ID: Charlotte [name], female   DOB: 04-Sep-1966, 47 y.o.   MRN: [id number] [facility] MD Progress Note  Subjective: patient was seen and reported that she is "doing well."  She voiced no complaints.  Objective: Patient seen and chart reviewed.  Patient is pleasant cooperative but remains disorganized and labile.  She remains on 1:1 due to her her labile and unpredictable behaviors. This morning she attempted to hug another patient, and was briefly placed in a PRT hold for less than 1 minute, as she ran at the patient from behind without warning. Diagnosis:  Schizoaffective disorder Bipolar type  ADL's:  Intact  Sleep: Fair  Appetite:  Poor  Suicidal Ideation:  Denies.   Homicidal Ideation:  Denies.   AEB (as evidenced by): patient's response, affect, and participation in unit programming.  Review of Systems  Constitutional: Negative.  Negative for fever, chills, weight loss, malaise/fatigue and diaphoresis.  HENT: Negative for congestion and sore throat.   Eyes: Negative for blurred vision, double vision and photophobia.  Respiratory: Negative for cough, shortness of breath and wheezing.   Cardiovascular: Negative for chest pain, palpitations and PND.  Gastrointestinal: Negative for heartburn, nausea, vomiting, abdominal pain, diarrhea and constipation.  Musculoskeletal: Negative for myalgias, joint pain and falls.  Neurological: Positive for headaches. Negative for dizziness, tingling, tremors, sensory change, speech change, focal weakness, seizures, loss of consciousness and weakness.  Endo/Heme/Allergies: Negative for polydipsia. Does not bruise/bleed easily.  Psychiatric/Behavioral: Positive for hallucinations. Negative for depression, suicidal ideas, memory loss and substance abuse. The patient is nervous/anxious. The patient does not have insomnia.        Disorganized thinking   Blood pressure 107/75, pulse 93, temperature 97.4 F (36.3 C), temperature source Oral, resp. rate 18,  height 5\' 5"  (1.651 m), weight 81.647 kg (180 lb).Body mass index is 29.95 kg/(m^2).  General Appearance: Disheveled with poor hygiene Behavior: unpredictable, intrusive  Eye Contact::  Fair  Speech:  Rambles and is tangential and loose  Volume:  Normal  Mood:  Anxious and Depressed  Affect:  flat  Thought Process:  Disorganized,    Orientation:  NA  Thought Content:  Hallucinations: patient appears to be responding to internal stimuli  Suicidal Thoughts:  Denies.    Homicidal Thoughts: Denies.    Memory:  NA  Judgement:  Impaired  Insight:  Lacking  Psychomotor Activity:  Psychomotor Retardation,   Concentration:  Poor  Recall:  Poor  Akathisia:  No  Handed:  Right  AIMS (if indicated):     Assets:  Desire for Improvement  Sleep:  Number of Hours: 3.25   Current Medications: Current Facility-Administered Medications  Medication Dose Route Frequency Provider Last Rate Last Dose  . acetaminophen (TYLENOL) tablet 650 mg  650 mg Oral Q6H PRN Sanjuana Kava, NP      . alum & mag hydroxide-simeth (MAALOX/MYLANTA) 200-200-20 MG/5ML suspension 30 mL  30 mL Oral Q4H PRN Sanjuana Kava, NP      . benztropine (COGENTIN) tablet 1 mg  1 mg Oral BID Verne Spurr, PA-C   1 mg at 02/05/13 0802  . fluPHENAZine (PROLIXIN) tablet 10 mg  10 mg Oral QHS Verne Spurr, PA-C   10 mg at 02/04/13 2129  . hydrOXYzine (ATARAX/VISTARIL) tablet 25 mg  25 mg Oral Q4H PRN Sanjuana Kava, NP   25 mg at 02/04/13 1814  . magnesium hydroxide (MILK OF MAGNESIA) suspension 30 mL  30 mL Oral Daily PRN Sanjuana Kava, NP      .  OLANZapine zydis (ZYPREXA) disintegrating tablet 30 mg  30 mg Oral QHS Verne Spurr, PA-C      . valproic acid (DEPAKENE) 250 MG capsule 250 mg  250 mg Oral QHS Verne Spurr, PA-C   250 mg at 02/04/13 2129  . venlafaxine XR (EFFEXOR-XR) 24 hr capsule 37.5 mg  37.5 mg Oral q morning - 10a Verne Spurr, PA-C   37.5 mg at 02/05/13 2956    Lab Results: No results found for this or any previous  visit (from the past 48 hour(s)).  Physical Findings: AIMS: Facial and Oral Movements Muscles of Facial Expression: None, normal Lips and Perioral Area: None, normal Jaw: None, normal Tongue: None, normal,Extremity Movements Upper (arms, wrists, hands, fingers): None, normal Lower (legs, knees, ankles, toes): None, normal, Trunk Movements Neck, shoulders, hips: None, normal, Overall Severity Severity of abnormal movements (highest score from questions above): None, normal Incapacitation due to abnormal movements: None, normal Patient's awareness of abnormal movements (rate only patient's report): No Awareness, Dental Status Current problems with teeth and/or dentures?: No Does patient usually wear dentures?: No  CIWA:  CIWA-Ar Total: 0 COWS:  COWS Total Score: 0 Treatment Plan:  1. Continue crisis management and stabilization. 2. Medication management to reduce current symptoms to base line and improve patient's overall level of functioning 3. Treat health problems as indicated. 4. Develop treatment plan to decrease risk of relapse upon discharge and the need for readmission. 5. Psycho-social education regarding relapse prevention and self care. 6. Health care follow up as needed for medical problems. 7. Continue home medications where appropriate. 8. Prolixin is increased to 15mg  po BID today. 9. I spoke with Dr. Lolly Mustache who felt that the patient will need long term psychiatric care at a higher level and is recommending an ALF or SNF to give her more consistent care. He will not continue to follow her upon discharge. 10. CM will need to start looking for placement and apply for a PASSAR number if needed. 11. CIRT packet is completed per RN. Medical Decision Making Problem Points:  Established problem, worsening (2) and Review of psycho-social stressors (1) Data Points:  Review or order clinical lab tests (1) Review or order medicine tests (1) Review and summation of old records  (2) Review of medication regiment & side effects (2)  I certify that inpatient services furnished can reasonably be expected to improve the patient's condition.   Rona Ravens. Irmgard Rampersaud RPAC 11:44 AM 02/14/2013

## 2013-02-14 NOTE — Progress Notes (Signed)
D:  Patient lying on the bed.  Smiling and interacting with sitter.  States she ate well this evening and has no needs at this time.   A:  Encouraged her to communicate her wants and needs to staff.   R:  Patient remains safe on 1:1 at this time.

## 2013-02-14 NOTE — Progress Notes (Signed)
[facility] LCSW Aftercare Discharge Planning Group Note  02/14/2013 10:34 AM  Participation Quality:  Bizarre  Affect:  Flat  Cognitive:  Disorganized  Insight:  None  Engagement in Group:  Limited  Modes of Intervention:  Discussion, Exploration and Socialization  Summary of Progress/Problems: "I am using other peoples' voices today."  When asked who's voice she was using, she stated that it was the last person who was sitting in that chair.  When I followed up with more questions, she abruptly got up and left.  [name] B 02/14/2013, 10:34 AM

## 2013-02-14 NOTE — Progress Notes (Signed)
D:  Patient observed sitting on the floor talking with her husband.  She is mildly confused and is disorganized, making it  difficult to follow a conversation.    A:  Patient remains on 1:1 for safety.  Medications given as ordered. R:  Pleasant and cooperative with staff.  Minimal interaction with peers.  Patient is safe at this time.

## 2013-02-15 ENCOUNTER — Inpatient Hospital Stay (HOSPITAL_COMMUNITY)
Admission: AD | Admit: 2013-02-15 | Discharge: 2013-02-15 | Disposition: A | Discharge status: Home / Self Care | Payer: 59 | Source: Intra-hospital | Attending: Physician Assistant | Admitting: Physician Assistant

## 2013-02-15 ENCOUNTER — Ambulatory Visit (HOSPITAL_COMMUNITY): Payer: Self-pay | Admitting: Psychiatry

## 2013-02-15 NOTE — Progress Notes (Signed)
RN 1:1 Note  Patient denies SI/HI and A/V hallucinations; patient is still delusional but calm; patient conversation disorganized but she can be logical and coherent at times; patient was able to tell me the date, year, month, and place; patient has no acute distress at this time; patient sent off the unit for a CT Head per physician order;

## 2013-02-15 NOTE — Progress Notes (Signed)
Patient ID: Charlotte [name], female   DOB: 01/13/1966, 47 y.o.   MRN: [id number] [facility] MD Progress Note  02/15/2013 11:36 AM Charlotte [name]  MRN:  [id number] Subjective: Patient has not made any significant improvement despite being on two anti-psychotics. She remains delusional, psychotic, bizarre and  Disorganized. Her behavior is unpredictable, she is intrusive, often wandering to other patient's room and has to be put on 1;1 observation. She has  not reported any adverse reaction to her medications.  Diagnosis:  Schizoaffective disorder Bipolar type  ADL's:  Intact  Sleep: Fair  Appetite:  Poor  Suicidal Ideation:  Unable to assess Homicidal Ideation:  Unable to assess AEB (as evidenced by): patient's response, affect, and participation in unit programming.  Psychiatric Specialty Exam: Review of Systems  Constitutional: Negative.  Negative for fever, chills, weight loss, malaise/fatigue and diaphoresis.  HENT: Negative for congestion and sore throat.   Eyes: Negative for blurred vision, double vision and photophobia.  Respiratory: Negative for cough, shortness of breath and wheezing.   Cardiovascular: Negative for chest pain, palpitations and PND.  Gastrointestinal: Negative for heartburn, nausea, vomiting, abdominal pain, diarrhea and constipation.  Musculoskeletal: Negative for myalgias, joint pain and falls.  Neurological: Negative for dizziness, tingling, tremors, sensory change, speech change, focal weakness, seizures, loss of consciousness, weakness and headaches.  Endo/Heme/Allergies: Negative for polydipsia. Does not bruise/bleed easily.  Psychiatric/Behavioral: Positive for hallucinations. Negative for depression, suicidal ideas, memory loss and substance abuse. The patient is nervous/anxious. The patient does not have insomnia.     Blood pressure 128/80, pulse 103, temperature 98.3 F (36.8 C), temperature source Oral, resp. rate 18, height 5\' 5"  (1.651 m), weight 81.647  kg (180 lb).Body mass index is 29.95 kg/(m^2).  General Appearance: fairly groomed  Patent attorney::  Fair  Speech:  Blocked  Volume:  Normal  Mood:  Anxious and Depressed  Affect:  Flat  Thought Process:  Disorganized  Orientation:  NA  Thought Content:  Hallucinations: patient appears to be responding to internal stimuli  Suicidal Thoughts:  I don't know  Homicidal Thoughts:  I don't know  Memory:  NA  Judgement:  Impaired  Insight:  Lacking  Psychomotor Activity:  Psychomotor Retardation  Concentration:  Poor  Recall:  Poor  Akathisia:  No  Handed:  Right  AIMS (if indicated):     Assets:  Desire for Improvement  Sleep:  Number of Hours: 5.25   Current Medications: Current Facility-Administered Medications  Medication Dose Route Frequency Provider Last Rate Last Dose  . acetaminophen (TYLENOL) tablet 650 mg  650 mg Oral Q6H PRN Sanjuana Kava, NP      . alum & mag hydroxide-simeth (MAALOX/MYLANTA) 200-200-20 MG/5ML suspension 30 mL  30 mL Oral Q4H PRN Sanjuana Kava, NP      . benztropine (COGENTIN) tablet 1 mg  1 mg Oral BID Verne Spurr, PA-C   1 mg at 02/05/13 0802  . fluPHENAZine (PROLIXIN) tablet 10 mg  10 mg Oral QHS Verne Spurr, PA-C   10 mg at 02/04/13 2129  . hydrOXYzine (ATARAX/VISTARIL) tablet 25 mg  25 mg Oral Q4H PRN Sanjuana Kava, NP   25 mg at 02/04/13 1814  . magnesium hydroxide (MILK OF MAGNESIA) suspension 30 mL  30 mL Oral Daily PRN Sanjuana Kava, NP      . OLANZapine zydis (ZYPREXA) disintegrating tablet 30 mg  30 mg Oral QHS Verne Spurr, PA-C      . valproic acid (DEPAKENE) 250 MG capsule 250 mg  250 mg Oral QHS Verne Spurr, PA-C   250 mg at 02/04/13 2129  . venlafaxine XR (EFFEXOR-XR) 24 hr capsule 37.5 mg  37.5 mg Oral q morning - 10a Verne Spurr, PA-C   37.5 mg at 02/05/13 9604    Lab Results: No results found for this or any previous visit (from the past 48 hour(s)).  Physical Findings: AIMS: Facial and Oral Movements Muscles of Facial  Expression: None, normal Lips and Perioral Area: None, normal Jaw: None, normal Tongue: None, normal,Extremity Movements Upper (arms, wrists, hands, fingers): None, normal Lower (legs, knees, ankles, toes): None, normal, Trunk Movements Neck, shoulders, hips: None, normal, Overall Severity Severity of abnormal movements (highest score from questions above): None, normal Incapacitation due to abnormal movements: None, normal Patient's awareness of abnormal movements (rate only patient's report): No Awareness, Dental Status Current problems with teeth and/or dentures?: No Does patient usually wear dentures?: No  CIWA:  CIWA-Ar Total: 0 COWS:  COWS Total Score: 0  Treatment Plan Summary: Daily contact with patient to assess and evaluate symptoms and progress in treatment Medication management  Plan: 1. Continue crisis management and stabilization. 2. Medication management to reduce current symptoms to base line and improve patient's overall level of functioning 3. Treat health problems as indicated. 4. Develop treatment plan to decrease risk of relapse upon discharge and the need for readmission. 5. Psycho-social education regarding relapse prevention and self care. 6. Health care follow up as needed for medical problems. 7. Continue home medications where appropriate. 8. Will increase Zyprexa to 15 mg  Twice daily. 9.Will continue prolixin 10 mg q AM and 10mg  at hs. 10. Will also slowly increase Depakene as she becomes more stable.  Medical Decision Making Problem Points:  Established problem, worsening (2) and Review of psycho-social stressors (1) Data Points:  Review or order clinical lab tests (1) Review or order medicine tests (1) Review and summation of old records (2) Review of medication regiment & side effects (2)  I certify that inpatient services furnished can reasonably be expected to improve the patient's condition.    Thedore Mins, MD 02/15/2013 11:36 AM

## 2013-02-15 NOTE — Progress Notes (Signed)
Pt observed in her room sitting up in bed talking with her sitter.  Her thoughts are still disorganized, but as of now, she is calm and cooperative.  She denies SI/HI/AV, but does appear to be responding to internal stimuli. Support/encouragement given.  Safety maintained with 1:1 observation.  Sitter at bedside.

## 2013-02-15 NOTE — Progress Notes (Signed)
Psychoeducational Group Note  Date:  02/15/2013 Time:  2000  Group Topic/Focus:  Wrap-Up Group:   The focus of this group is to help patients review their daily goal of treatment and discuss progress on daily workbooks.  Participation Level: Did Not Attend  Participation Quality:  Not Applicable  Affect:  Not Applicable  Cognitive:  Not Applicable  Insight:  Not Applicable  Engagement in Group: Not Applicable  Additional Comments:  Did not attend.  Christ Kick 02/15/2013, 9:05 PM

## 2013-02-15 NOTE — Progress Notes (Signed)
[facility] LCSW Aftercare Discharge Planning Group Note  02/15/2013 9:35 AM  Participation Quality:  Drowsy  Affect:  Blunted  Cognitive:  Disorganized  Insight:  None  Engagement in Group:  Limited  Modes of Intervention:  Discussion, Exploration and Socialization  Summary of Progress/Problems: Charlotte [name] is in a good mood.  She moved close to someone on the love seat soon after group started; I instructed her to move back, and she followed my directions.  I directed questions to her after a patient from [place] spoke.  Charlotte [name] went with the [place] theme and talked about how she is doing better today because of her [place] doctors, and then talked about how she dressed as Lanier Prude when she went tick or treating as a child.  [name] B 02/15/2013, 9:35 AM

## 2013-02-15 NOTE — Progress Notes (Signed)
1:1 Note  Patient currently in room talking with MHT. Patient denies any concerns or needs at this time. Will continue to monitor patient for safety.

## 2013-02-15 NOTE — Progress Notes (Addendum)
D: Patient denies SI/HI and auditory and visual hallucinations. The patient has a labile mood and affect. The patient rates her depression and hopelessness both 0 out of 10 (1 low/10 high). The patient reports sleeping fairly well and states that her appetite is improving and that her energy level is low. The patient appears child-like at times. The patient is still having religious delusions and states that she "needs to pray for everyone." The patient presents as tangential and has problems answering MD and RN questions. The patient also appears restless on the unit.  A: Patient given emotional support from RN. Patient encouraged to come to staff with concerns and/or questions. Patient's medication routine continued. Patient's orders and plan of care reviewed.  R: Patient remains cooperative. Will continue to monitor patient q15 minutes for safety.

## 2013-02-15 NOTE — Clinical Social Work Note (Signed)
[facility] Group Notes:  (Counselor/Nursing/MHT/Case Management/Adjunct)  11/04/2012 12:00 PM  Type of Therapy:  Group Therapy  Participation Level:  Did Not Attend  Smart, Heather N 11/04/2012, 12:00 PM  

## 2013-02-15 NOTE — Progress Notes (Signed)
Pt has been dozing since the beginning of the shift.  She awoke enough to take her 2000 meds and went back to sleep.  Pt had no complaints.  Support and encouragement given.  See 1:1 progress notes in the shadow chart.  Safety is maintained with 1:1 observation.

## 2013-02-15 NOTE — Progress Notes (Signed)
1:1 Note  Patient currently in room with MHT present. Patient states no concerns or needs at this time. Will continue to monitor patient for safety.

## 2013-02-15 NOTE — Treatment Plan (Signed)
  Interdisciplinary Treatment Plan Update   Date Reviewed:  02/15/2013  Time Reviewed:  1:48 PM  Progress in Treatment:   Attending groups: Yes Participating in groups: Minimally Taking medication as prescribed: Yes  Tolerating medication: Yes Family/Significant other contact made: Yes  Patient understands diagnosis: No  Limited insight Discussing patient identified problems/goals with staff: Yes  See initial tx plan Medical problems stabilized or resolved: Yes Denies suicidal/homicidal ideation: Yes Patient has not harmed self or others: Yes  For review of initial/current patient goals, please see plan of care.  Estimated Length of Stay:  4-5 days  Reason for Continuation of Hospitalization: Medication stabilization Other; describe Disorganized thinking Intrusive behavior  New Problems/Goals identified:  N/A  Discharge Plan or Barriers:   return home,  Follow up ACT team  Additional Comments: Margo continues with disorganized thinking and intrusive behavior despite the introduction of a second anti-psychotic medication.  Dr is continuing to adjust medications  To address symptoms.  Attendees:  Signature: Thedore Mins, MD 02/15/2013 1:48 PM   Signature: Richelle Ito, LCSW 02/15/2013 1:48 PM  Signature: Verne Spurr, PA 02/15/2013 1:48 PM  Signature: Nestor Ramp, RN 02/15/2013 1:48 PM  Signature:  02/15/2013 1:48 PM  Signature:  02/15/2013 1:48 PM  Signature:   02/15/2013 1:48 PM  Signature:    Signature:    Signature:    Signature:    Signature:    Signature:      Scribe for Treatment Team:   Richelle Ito, LCSW  02/15/2013 1:48 PM

## 2013-02-15 NOTE — Progress Notes (Signed)
Recreation Therapy Notes   Date: 02.26.2014 Time: 9:30am Location: [address] Day Room       Group Topic/Focus: Decision Making  Participation Level: Active  Participation Quality: Redirectable  Affect: Flat to Happy and then to Tearful  Cognitive: Patient was oriented to group for brief amount of time.    Additional Comments: Patient with peers played adapted version of "Chocies in a Jar" Patients were given a small styrofoam football with "ASK" written on one side and "ANSWER" written on the other. Patients were asked to throw the ball to a peer and decide if they wanted to ask a question or answer a question. If patient chose to ask a question they would pick a peer and select a question for the "Choice in a Jar" if patient chose to answer a question LRT would select question card from "Choices in a Jar" and ask the patient the question.   Prior to group starting patient stated she wanted to talk to the rec department because she is concerned about the amount of people [place] inter[place] and causing accidents. She stated she worries about the wrecks. Patient also stated she would like the rec department to put green areas next to all the interstates so people could walk and it would be safe. LRT was able to redirect patient attention to group activity. Patient participated in group activity. Patient had difficulty following instructions. Patient needed prompts to answer questions asked of her. Patient stared blankly at LRT and was unable to answer questions. Patient stated "I don't know I can't decide" when asked to make a selection off of a "Choices in a Jar" card. When asked a second questions patient was unable to make a decision between two options, but mumbled a statement about religion. LRT was unable to clearly make out what the patient stated. Upon asking the patient a third question patient became visibly distressed. Patient became tearful and looked like she was going to  cry. LRT inquired what was making the patient sad right now. Patient stated "All my ideas are floating away." Patient got up and left day room, patient was accompanied by MHT.   Charlotte [name], LRT/CTRS   [name] 02/15/2013 12:09 PM

## 2013-02-16 NOTE — Progress Notes (Signed)
Psychoeducational Group Note  Date:  02/16/2013 Time:  0930  Group Topic/Focus:  Rediscovering Joy:   The focus of this group is to explore various ways to relieve stress in a positive manner.  Participation Level: Did Not Attend  Participation Quality:  Not Applicable  Affect:  Not Applicable  Cognitive:  Not Applicable  Insight:  Not Applicable  Engagement in Group: Not Applicable  Additional Comments:  Pt was unable to attend group this morning.  Jhoselyn Ruffini E 02/16/2013, 3:17 PM

## 2013-02-16 NOTE — Progress Notes (Signed)
Patient ID: Charlotte [name], female   DOB: [birth year], 47 y.o.   MRN: [id number] 1:1 notes  02/16/2013 @ 2200 D: Patient in bed awake. Pt continues to be disorganized, hypersexual, and hyper religious. Pt mood/affect is flat. Pt is redirectable and cooperative with assessment. Pt endorses AVH but denies pain. No sign of distress noted at this time A: medication administered as prescribed.1:1 observation for safety R: Patient remains safe. Sitter at bedside. 1:1 continues

## 2013-02-16 NOTE — Progress Notes (Signed)
1:1 Note  Patient currently in room resting on bed, no distress noted. MHT present at bedside. Will continue to monitor patient for safety.
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
## 2013-02-16 NOTE — Progress Notes (Signed)
[facility] LCSW Aftercare Discharge Planning Group Note  02/16/2013 9:16 AM  Participation Quality:  Declined to attend  "I will get in the way of your spiritual growth if I come to group.  I have to stay away from you."   Ida Rogue 02/16/2013, 9:16 AM

## 2013-02-16 NOTE — Progress Notes (Signed)
1:1 Note  Patient currently sitting on bed talking with MHT. Patient denies any concerns or needs at this time. Will continue to monitor patient for safety.

## 2013-02-16 NOTE — Consult Note (Signed)
NEURO HOSPITALIST CONSULT NOTE    Reason for Consult: AMS  HPI:                                                                                                                                          Charlotte [name] is an 47 y.o. female Patient is brought to the ED by her husband who has IVC'd her due to erratic behaviors for the last several days. She has long history of mental illness with multiple admissions.  Patient at time of exam was alert and oriented followed all my commands wen she desired to. No focal findings.    Past Medical History  Diagnosis Date  . Headache   . Paranoid schizophrenia   . Depression   . Schizophrenia   . Low back pain     History reviewed. No pertinent past surgical history.  Family History  Problem Relation Age of Onset  . Diabetes    . Hypertension    . Breast cancer      Social History:  reports that she has quit smoking. She does not have any smokeless tobacco history on file. She reports that she does not drink alcohol or use illicit drugs.  Allergies  Allergen Reactions  . Divalproex Sodium     Causing tremors  . Risperidone Itching    Causes altered mental status  . Sulfonamide Derivatives     REACTION: Eye irritation    MEDICATIONS:                                                                                                                     Prior to Admission:  Prescriptions prior to admission  Medication Sig Dispense Refill  . fluPHENAZine (PROLIXIN) 5 MG tablet Take 5 mg by mouth 2 (two) times daily.      Marland Kitchen OLANZapine zydis (ZYPREXA) 10 MG disintegrating tablet Take 30 mg by mouth at bedtime.       . valproic acid (DEPAKENE) 250 MG capsule Take 250 mg by mouth at bedtime.      Marland Kitchen venlafaxine XR (EFFEXOR-XR) 37.5 MG 24 hr capsule Take 37.5 mg by mouth every morning.      . benztropine (COGENTIN) 1 MG tablet Take 1 mg by mouth 2 (two) times daily.  Scheduled: . benztropine  1 mg Oral BID  .  fluPHENAZine  15 mg Oral BID  . OLANZapine zydis  15 mg Oral BID  . valproic acid  250 mg Oral QHS  . venlafaxine XR  75 mg Oral Daily  . ziprasidone  20 mg Intramuscular Once     ROS:                                                                                                                                       History obtained from the patient  General ROS: negative for - chills, fatigue, fever, night sweats, weight gain or weight loss Psychological ROS: negative for - behavioral disorder, hallucinations, memory difficulties, mood swings or suicidal ideation Ophthalmic ROS: negative for - blurry vision, double vision, eye pain or loss of vision ENT ROS: negative for - epistaxis, nasal discharge, oral lesions, sore throat, tinnitus or vertigo Allergy and Immunology ROS: negative for - hives or itchy/watery eyes Hematological and Lymphatic ROS: negative for - bleeding problems, bruising or swollen lymph nodes Endocrine ROS: negative for - galactorrhea, hair pattern changes, polydipsia/polyuria or temperature intolerance Respiratory ROS: negative for - cough, hemoptysis, shortness of breath or wheezing Cardiovascular ROS: negative for - chest pain, dyspnea on exertion, edema or irregular heartbeat Gastrointestinal ROS: negative for - abdominal pain, diarrhea, hematemesis, nausea/vomiting or stool incontinence Genito-Urinary ROS: negative for - dysuria, hematuria, incontinence or urinary frequency/urgency Musculoskeletal ROS: negative for - joint swelling or muscular weakness Neurological ROS: as noted in HPI Dermatological ROS: negative for rash and skin lesion changes   Blood pressure 134/92, pulse 90, temperature 97.9 F (36.6 C), temperature source Oral, resp. rate 16, height 5\' 5"  (1.651 m), weight 81.647 kg (180 lb).   Neurologic Examination:                                                                                                      Mental Status: Alert, oriented,  thought content appropriate.  Speech fluent without evidence of aphasia.  Able to follow 3 step commands without difficulty. Cranial Nerves: II: Discs flat bilaterally; Visual fields grossly normal, pupils equal, round, reactive to light and accommodation III,IV, VI: ptosis not present, extra-ocular motions intact bilaterally V,VII: smile symmetric, facial light touch sensation normal bilaterally VIII: hearing normal bilaterally IX,X: gag reflex present XI: bilateral shoulder shrug XII: midline tongue extension Motor: Right : Upper extremity   5/5    Left:     Upper extremity   5/5  Lower extremity   5/5     Lower extremity   5/5 Tone and bulk:normal tone throughout; no atrophy noted Sensory: Pinprick and light touch intact throughout, bilaterally Deep Tendon Reflexes: 2+ and symmetric throughout Plantars: Right: downgoing   Left: downgoing Cerebellar: normal finger-to-nose,  normal heel-to-shin test CV: pulses palpable throughout    Lab Results  Component Value Date/Time   CHOL 149 09/08/2011  3:54 PM    No results found for this or any previous visit (from the past 48 hour(s)).  Ct Head Wo Contrast  02/15/2013  *RADIOLOGY REPORT*  Clinical Data: Altered mental status.  CT HEAD WITHOUT CONTRAST  Technique:  Contiguous axial images were obtained from the base of the skull through the vertex without contrast.  Comparison: None.  Findings: No acute intracranial abnormality.  Specifically, no hemorrhage, hydrocephalus, mass lesion, acute infarction, or significant intracranial injury.  No acute calvarial abnormality. Visualized paranasal sinuses and mastoids clear.  Orbital soft tissues unremarkable.  IMPRESSION: Normal study.   Original Report Authenticated By: Charlett Nose, M.D.      Assessment/Plan: 47 YO female with bipolar and schizoaffective D/o admitted to behavior health due to abnormal behavior.  Currently she is alert, oriented to place, date year, follows all commands when  she wants to. No focal or lateralizing signs.  Behavior likely secondary to known underlying physiatric disorder.   No further neurologic work-up warrented.    Neurology will S/O  Assessment and plan discussed with with attending physician and they are in agreement.    Felicie Morn PA-C [facility] [phone]  02/16/2013, 11:12 AM  Patient seen and examined together with physician assistant and I concur with the assessment and plan.  Wyatt Portela, MD

## 2013-02-16 NOTE — Progress Notes (Signed)
1:1 Note  Patient observed in group room, patient currently sitting on the floor and looking out the window. MHT present and sitting with patient. No distress noted. Will continue to monitor patient for safety.

## 2013-02-16 NOTE — Clinical Social Work Note (Signed)
[facility] Group Notes:  (Counselor/Nursing/MHT/Case Management/Adjunct)  02/16/2013 2:43 PM   Type of Therapy:  Group Therapy  Participation Level:  Minimal  Participation Quality:  Inattentive and Resistant  Affect:  Anxious and Depressed  Cognitive:  Disorganized and Confused  Insight:  Lacking  Engagement in Group:  Poor  Engagement in Therapy:  Poor  Modes of Intervention:  Activity, Discussion, Socialization and Support  Summary of Progress/Problems: Topic-Balance: The topic for group was balance in life. Charlotte [name] was visibly upset when her husband left and decided to sit by the window and look outside. She asked to go outside to get some air and was told that she was unable to go outside at this time. She talked to herself quietly thoughtout group and appeared to console herself over the course of this group session. When asked if she was feeling all right, she responded that she feels like she did something wrong. She was comforted and encouraged to get up and sit in a chair with others. She kindly refused.   Smart, Heather N 11/03/2012, 2:34 PM

## 2013-02-17 MED ORDER — LITHIUM CARBONATE ER 300 MG PO TBCR
300.0000 mg | EXTENDED_RELEASE_TABLET | Freq: Two times a day (BID) | ORAL | Status: DC
  Administered 2013-02-17 – 2013-02-23 (×13): 300 mg via ORAL
  Filled 2013-02-17 (×3): qty 1
  Filled 2013-02-17: qty 28
  Filled 2013-02-17 (×9): qty 1
  Filled 2013-02-17: qty 28
  Filled 2013-02-17 (×3): qty 1

## 2013-02-17 NOTE — Progress Notes (Signed)
  D) Patient cooperative upon my assessment. Patient did not  complete Patient Self Inventory. Patient reports "I slept okay, pretty good" patient states appetite is "okay." Patient continues to be disorganized and tangential at times. Patient denies SI/HI Patient continues to endorse visual hallucinations, patient claims to see "people in the shadows."   A) Patient offered support and encouragement, patient encouraged to discuss feelings/concerns with staff. Patient verbalized understanding. Patient monitored Q15 minutes for safety. Patient met with MD  to discuss today's goals and plan of care.  R) Patient visible in milieu, attending some groups in day room. Patient appropriate with staff.  Patient taking medications as ordered. Will continue to monitor.

## 2013-02-17 NOTE — Progress Notes (Signed)
Patient ID: Charlotte [name], female   DOB: 09-24-1966, 47 y.o.   MRN: [id number] [facility] MD Progress Note This is a late entry. The patient was seen in a timely fashion on the appropriate date, the note was written but accidentally deleted.  02/16/2013  Subjective: "Can I ask you a question? What's wrong with your knee?" Patient asked as we spoke. Objective: Mell continues to remain on 1:1, is still disorganized and emotionally labile. She can not follow the conversation and rambles from topic to topic. She denies new symptoms.  Diagnosis:  Schizoaffective disorder Bipolar type  ADL's:  Intact  Sleep: Fair  Appetite:  Poor  Suicidal Ideation:  Unable to assess Homicidal Ideation:  Unable to assess AEB (as evidenced by): patient's response, affect, and participation in unit programming.  Psychiatric Specialty Exam: Review of Systems  Constitutional: Negative.  Negative for fever, chills, weight loss, malaise/fatigue and diaphoresis.  HENT: Negative for congestion and sore throat.   Eyes: Negative for blurred vision, double vision and photophobia.  Respiratory: Negative for cough, shortness of breath and wheezing.   Cardiovascular: Negative for chest pain, palpitations and PND.  Gastrointestinal: Negative for heartburn, nausea, vomiting, abdominal pain, diarrhea and constipation.  Musculoskeletal: Negative for myalgias, joint pain and falls.  Neurological: Negative for dizziness, tingling, tremors, sensory change, speech change, focal weakness, seizures, loss of consciousness, weakness and headaches.  Endo/Heme/Allergies: Negative for polydipsia. Does not bruise/bleed easily.  Psychiatric/Behavioral: Positive for hallucinations. Negative for depression, suicidal ideas, memory loss and substance abuse. The patient is nervous/anxious. The patient does not have insomnia.     Blood pressure 110/80, pulse 96, temperature 97.8 F (36.6 C), temperature source Oral, resp. rate 18, height 5'  5" (1.651 m), weight 81.647 kg (180 lb).Body mass index is 29.95 kg/(m^2).  General Appearance: fairly groomed  Patent attorney::  Fair  Speech:  Blocked  Volume:  Normal  Mood:  Anxious and Depressed  Affect:  Flat  Thought Process:  Disorganized  Orientation:  NA  Thought Content:  Hallucinations: patient appears to be responding to internal stimuli  Suicidal Thoughts:  I don't know  Homicidal Thoughts:  I don't know  Memory:  NA  Judgement:  Impaired  Insight:  Lacking  Psychomotor Activity:  Psychomotor Retardation  Concentration:  Poor  Recall:  Poor  Akathisia:  No  Handed:  Right  AIMS (if indicated):     Assets:  Desire for Improvement  Sleep:  Number of Hours: 3.25   Current Medications: Current Facility-Administered Medications  Medication Dose Route Frequency Provider Last Rate Last Dose  . acetaminophen (TYLENOL) tablet 650 mg  650 mg Oral Q6H PRN Sanjuana Kava, NP      . alum & mag hydroxide-simeth (MAALOX/MYLANTA) 200-200-20 MG/5ML suspension 30 mL  30 mL Oral Q4H PRN Sanjuana Kava, NP      . benztropine (COGENTIN) tablet 1 mg  1 mg Oral BID Verne Spurr, PA-C   1 mg at 02/05/13 0802  . fluPHENAZine (PROLIXIN) tablet 10 mg  10 mg Oral QHS Verne Spurr, PA-C   10 mg at 02/04/13 2129  . hydrOXYzine (ATARAX/VISTARIL) tablet 25 mg  25 mg Oral Q4H PRN Sanjuana Kava, NP   25 mg at 02/04/13 1814  . magnesium hydroxide (MILK OF MAGNESIA) suspension 30 mL  30 mL Oral Daily PRN Sanjuana Kava, NP      . OLANZapine zydis (ZYPREXA) disintegrating tablet 30 mg  30 mg Oral QHS Verne Spurr, PA-C      .  valproic acid (DEPAKENE) 250 MG capsule 250 mg  250 mg Oral QHS Verne Spurr, PA-C   250 mg at 02/04/13 2129  . venlafaxine XR (EFFEXOR-XR) 24 hr capsule 37.5 mg  37.5 mg Oral q morning - 10a Verne Spurr, PA-C   37.5 mg at 02/05/13 4098    Lab Results: No results found for this or any previous visit (from the past 48 hour(s)).  Physical Findings: AIMS: Facial and Oral  Movements Muscles of Facial Expression: None, normal Lips and Perioral Area: None, normal Jaw: None, normal Tongue: None, normal,Extremity Movements Upper (arms, wrists, hands, fingers): None, normal Lower (legs, knees, ankles, toes): None, normal, Trunk Movements Neck, shoulders, hips: None, normal, Overall Severity Severity of abnormal movements (highest score from questions above): None, normal Incapacitation due to abnormal movements: None, normal Patient's awareness of abnormal movements (rate only patient's report): No Awareness, Dental Status Current problems with teeth and/or dentures?: No Does patient usually wear dentures?: No  CIWA:  CIWA-Ar Total: 0 COWS:  COWS Total Score: 0  Treatment Plan Summary: Daily contact with patient to assess and evaluate symptoms and progress in treatment Medication management  Plan: 1. Continue crisis management and stabilization. 2. Medication management to reduce current symptoms to base line and improve patient's overall level of functioning 3. Treat health problems as indicated. 4. Develop treatment plan to decrease risk of relapse upon discharge and the need for readmission. 5. Psycho-social education regarding relapse prevention and self care. 6. Health care follow up as needed for medical problems. 7. Continue home medications where appropriate. 8. Will  Zyprexa to 15 mg  Twice daily. 9.Will continue prolixin 10 mg q AM and 10mg  at hs. 10. Will also slowly increase Depakene as she becomes more stable. 11. Head CT was reviewed and is normal. Medical Decision Making Problem Points:  Established problem, worsening (2) and Review of psycho-social stressors (1) Data Points:  Review or order clinical lab tests (1) Review or order medicine tests (1) Review and summation of old records (2) Review of medication regiment & side effects (2)  I certify that inpatient services furnished can reasonably be expected to improve the patient's  condition.   Rona Ravens. Mohsin Crum RPAC 7:26 PM  02/16/2013

## 2013-02-17 NOTE — Progress Notes (Signed)
Patient ID: Charlotte [name], female   DOB: 1966-02-23, 47 y.o.   MRN: [id number] 1:1 notes  02/17/2013 @ 2200 D: Patient in bed sleeping. Sitter confirmed pt has been opening her eyes intermittently. Respiration regular and unlabored. No sign of distress noted at this time A: Clinical research associate administered 2200 medications as prescribed. 1:1 observation for safety R: Patient remains asleep. Sitter at bedside. 1:1 continues

## 2013-02-17 NOTE — Progress Notes (Signed)
Recreation Therapy Notes   Date: 02.28.2014 Time: 9:30am Location: [address] Day Room      Group Topic/Focus: Communication  Participation Level: Did not attend  [employer], LRT/CTRS    [name] 02/17/2013 11:29 AM

## 2013-02-17 NOTE — Progress Notes (Signed)
[facility] LCSW Aftercare Discharge Planning Group Note  02/17/2013 9:47 AM  Participation Quality: DID NOT ATTEND.   Smart, Ledell Peoples 02/17/2013, 9:47 AM

## 2013-02-17 NOTE — Progress Notes (Signed)
Patient ID: Charlotte [name], female   DOB: 03-16-1966, 47 y.o.   MRN: [id number] 1:1 notes  02/17/2013 @ 0600 D: Patient in bed sleeping. Respiration regular and unlabored. No sign of distress noted at this time A: 1:1 observation for safety R: Patient remains asleep. Sitter at bedside. 1:1 continues

## 2013-02-17 NOTE — Progress Notes (Signed)
Patient ID: Charlotte [name], female   DOB: Nov 24, 1966, 47 y.o.   MRN: [id number] [facility] MD Progress Note  Subjective: "I am grieving over relationships that I lost." Objective: Patient seen and chart reviewed.  Patient is still on 1;1 and remains disorganized. Currently she is crying but does not know why.  Diagnosis:  Schizoaffective disorder Bipolar type  ADL's:  Intact  Sleep: Fair  Appetite:  Poor  Suicidal Ideation:  Denies.   Homicidal Ideation:  Denies.   AEB (as evidenced by): patient's response, affect, and participation in unit programming.  Review of Systems  Constitutional: Negative.  Negative for fever, chills, weight loss, malaise/fatigue and diaphoresis.  HENT: Negative for congestion and sore throat.   Eyes: Negative for blurred vision, double vision and photophobia.  Respiratory: Negative for cough, shortness of breath and wheezing.   Cardiovascular: Negative for chest pain, palpitations and PND.  Gastrointestinal: Negative for heartburn, nausea, vomiting, abdominal pain, diarrhea and constipation.  Musculoskeletal: Negative for myalgias, joint pain and falls.  Neurological: Positive for headaches. Negative for dizziness, tingling, tremors, sensory change, speech change, focal weakness, seizures, loss of consciousness and weakness.  Endo/Heme/Allergies: Negative for polydipsia. Does not bruise/bleed easily.  Psychiatric/Behavioral: Positive for hallucinations. Negative for depression, suicidal ideas, memory loss and substance abuse. The patient is nervous/anxious. The patient does not have insomnia.        Disorganized thinking   Blood pressure 107/75, pulse 93, temperature 97.4 F (36.3 C), temperature source Oral, resp. rate 18, height 5\' 5"  (1.651 m), weight 81.647 kg (180 lb).Body mass index is 29.95 kg/(m^2).  General Appearance: Disheveled with poor hygiene Behavior: unpredictable, intrusive  Eye Contact::  Fair  Speech:  Rambles and is tangential and loose   Volume:  Normal  Mood:  Anxious and Depressed  Affect:  flat  Thought Process:  Disorganized,    Orientation:  NA  Thought Content:  Hallucinations: patient appears to be responding to internal stimuli  Suicidal Thoughts:  Denies.    Homicidal Thoughts: Denies.    Memory:  NA  Judgement:  Impaired  Insight:  Lacking  Psychomotor Activity:  Psychomotor Retardation,   Concentration:  Poor  Recall:  Poor  Akathisia:  No  Handed:  Right  AIMS (if indicated):     Assets:  Desire for Improvement  Sleep:  Number of Hours: 3.25   Current Medications: Current Facility-Administered Medications  Medication Dose Route Frequency Provider Last Rate Last Dose  . acetaminophen (TYLENOL) tablet 650 mg  650 mg Oral Q6H PRN Sanjuana Kava, NP      . alum & mag hydroxide-simeth (MAALOX/MYLANTA) 200-200-20 MG/5ML suspension 30 mL  30 mL Oral Q4H PRN Sanjuana Kava, NP      . benztropine (COGENTIN) tablet 1 mg  1 mg Oral BID Verne Spurr, PA-C   1 mg at 02/05/13 0802  . fluPHENAZine (PROLIXIN) tablet 10 mg  10 mg Oral QHS Verne Spurr, PA-C   10 mg at 02/04/13 2129  . hydrOXYzine (ATARAX/VISTARIL) tablet 25 mg  25 mg Oral Q4H PRN Sanjuana Kava, NP   25 mg at 02/04/13 1814  . magnesium hydroxide (MILK OF MAGNESIA) suspension 30 mL  30 mL Oral Daily PRN Sanjuana Kava, NP      . OLANZapine zydis (ZYPREXA) disintegrating tablet 30 mg  30 mg Oral QHS Verne Spurr, PA-C      . valproic acid (DEPAKENE) 250 MG capsule 250 mg  250 mg Oral QHS Verne Spurr, PA-C   250  mg at 02/04/13 2129  . venlafaxine XR (EFFEXOR-XR) 24 hr capsule 37.5 mg  37.5 mg Oral q morning - 10a Verne Spurr, PA-C   37.5 mg at 02/05/13 1610    Lab Results: No results found for this or any previous visit (from the past 48 hour(s)).  Physical Findings: AIMS: Facial and Oral Movements Muscles of Facial Expression: None, normal Lips and Perioral Area: None, normal Jaw: None, normal Tongue: None, normal,Extremity Movements Upper  (arms, wrists, hands, fingers): None, normal Lower (legs, knees, ankles, toes): None, normal, Trunk Movements Neck, shoulders, hips: None, normal, Overall Severity Severity of abnormal movements (highest score from questions above): None, normal Incapacitation due to abnormal movements: None, normal Patient's awareness of abnormal movements (rate only patient's report): No Awareness, Dental Status Current problems with teeth and/or dentures?: No Does patient usually wear dentures?: No  CIWA:  CIWA-Ar Total: 0 COWS:  COWS Total Score: 0 Treatment Plan:  1. Continue crisis management and stabilization. 2. Medication management to reduce current symptoms to base line and improve patient's overall level of functioning 3. Treat health problems as indicated. 4. Develop treatment plan to decrease risk of relapse upon discharge and the need for readmission. 5. Psycho-social education regarding relapse prevention and self care. 6. Health care follow up as needed for medical problems. 7. Continue home medications where appropriate. 8. Prolixin is increased to 15mg  po BID today. 9. CM will need to start looking for placement and apply for a PASSAR number if needed. 10. Will continue 1:1 as of today due to the patient's emotional lability. Medical Decision Making Problem Points:  Established problem, worsening (2) and Review of psycho-social stressors (1) Data Points:  Review or order clinical lab tests (1) Review or order medicine tests (1) Review and summation of old records (2) Review of medication regiment & side effects (2)  I certify that inpatient services furnished can reasonably be expected to improve the patient's condition.   Rona Ravens. Kymoni Monday RPAC 4:07 PM 02/17/2013

## 2013-02-17 NOTE — Progress Notes (Signed)
1:1 RN Note Patient resting quietly in room upon my approach. Patient came to window for medication as directed. Patient cooperative with staff. Patient denies SI/HI, patient endorses visual hallucinations. Patient states "what is that over there? I'm scared of that!" Patient continues to require 1:1 observation for safety. MHT at bedside, patient safe on unit. Will continue to monitor.

## 2013-02-17 NOTE — Progress Notes (Signed)
Patient ID: Charlotte [name], female   DOB: 26-Oct-1966, 47 y.o.   MRN: [id number] [facility] MD Progress Note  02/17/2013 9:19 AM Kalla Watson  MRN:  [id number]  Subjective: Patient remains delusional, psychotic, disorganized and bizarre. She still talking about a man, demons and spirits  that live inside her. She reports that she sees shadow but only hears the voices of the demons and the spirits. Her behavior is unpredictable, she is intrusive, often wandering to other patient's room and has to be put on 1;1 observation. She has  not reported any adverse reaction to her medications.  Diagnosis:  Schizoaffective disorder Bipolar type  ADL's:  Intact  Sleep: Fair  Appetite:  fair  Suicidal Ideation:  Unable to assess Homicidal Ideation:  Unable to assess AEB (as evidenced by): patient's response, affect, and participation in unit programming.  Psychiatric Specialty Exam: Review of Systems  Constitutional: Negative.  Negative for fever, chills, weight loss, malaise/fatigue and diaphoresis.  HENT: Negative for congestion and sore throat.   Eyes: Negative for blurred vision, double vision and photophobia.  Respiratory: Negative for cough, shortness of breath and wheezing.   Cardiovascular: Negative for chest pain, palpitations and PND.  Gastrointestinal: Negative for heartburn, nausea, vomiting, abdominal pain, diarrhea and constipation.  Musculoskeletal: Negative for myalgias, joint pain and falls.  Neurological: Negative for dizziness, tingling, tremors, sensory change, speech change, focal weakness, seizures, loss of consciousness, weakness and headaches.  Endo/Heme/Allergies: Negative for polydipsia. Does not bruise/bleed easily.  Psychiatric/Behavioral: Positive for hallucinations. Negative for depression, suicidal ideas, memory loss and substance abuse. The patient is nervous/anxious. The patient does not have insomnia.     Blood pressure 110/80, pulse 96, temperature 97.8 F (36.6 C),  temperature source Oral, resp. rate 18, height 5\' 5"  (1.651 m), weight 81.647 kg (180 lb).Body mass index is 29.95 kg/(m^2).  General Appearance: fairly groomed  Patent attorney::  Fair  Speech:  Slow but clear  Volume:  Normal  Mood:  Anxious and Depressed  Affect:  Flat  Thought Process:  Disorganized  Orientation:  NA  Thought Content:  Hallucinations: patient appears to be responding to internal stimuli  Suicidal Thoughts:  I don't know  Homicidal Thoughts:  I don't know  Memory:  NA  Judgement:  Impaired  Insight:  Lacking  Psychomotor Activity:  Psychomotor Retardation  Concentration:  Poor  Recall:  Poor  Akathisia:  No  Handed:  Right  AIMS (if indicated):     Assets:  Desire for Improvement  Sleep:  Number of Hours: 3.25   Current Medications: Current Facility-Administered Medications  Medication Dose Route Frequency Provider Last Rate Last Dose  . acetaminophen (TYLENOL) tablet 650 mg  650 mg Oral Q6H PRN Sanjuana Kava, NP      . alum & mag hydroxide-simeth (MAALOX/MYLANTA) 200-200-20 MG/5ML suspension 30 mL  30 mL Oral Q4H PRN Sanjuana Kava, NP      . benztropine (COGENTIN) tablet 1 mg  1 mg Oral BID Verne Spurr, PA-C   1 mg at 02/05/13 0802  . fluPHENAZine (PROLIXIN) tablet 10 mg  10 mg Oral QHS Verne Spurr, PA-C   10 mg at 02/04/13 2129  . hydrOXYzine (ATARAX/VISTARIL) tablet 25 mg  25 mg Oral Q4H PRN Sanjuana Kava, NP   25 mg at 02/04/13 1814  . magnesium hydroxide (MILK OF MAGNESIA) suspension 30 mL  30 mL Oral Daily PRN Sanjuana Kava, NP      . OLANZapine zydis (ZYPREXA) disintegrating tablet 30 mg  30 mg Oral QHS Verne Spurr, PA-C      . valproic acid (DEPAKENE) 250 MG capsule 250 mg  250 mg Oral QHS Verne Spurr, PA-C   250 mg at 02/04/13 2129  . venlafaxine XR (EFFEXOR-XR) 24 hr capsule 37.5 mg  37.5 mg Oral q morning - 10a Verne Spurr, PA-C   37.5 mg at 02/05/13 1610    Lab Results: No results found for this or any previous visit (from the past 48  hour(s)).  Physical Findings: AIMS: Facial and Oral Movements Muscles of Facial Expression: None, normal Lips and Perioral Area: None, normal Jaw: None, normal Tongue: None, normal,Extremity Movements Upper (arms, wrists, hands, fingers): None, normal Lower (legs, knees, ankles, toes): None, normal, Trunk Movements Neck, shoulders, hips: None, normal, Overall Severity Severity of abnormal movements (highest score from questions above): None, normal Incapacitation due to abnormal movements: None, normal Patient's awareness of abnormal movements (rate only patient's report): No Awareness, Dental Status Current problems with teeth and/or dentures?: No Does patient usually wear dentures?: No  CIWA:  CIWA-Ar Total: 0 COWS:  COWS Total Score: 0  Treatment Plan Summary: Daily contact with patient to assess and evaluate symptoms and progress in treatment Medication management  Plan: 1. Continue crisis management and stabilization. 2. Medication management to reduce current symptoms to base line and improve patient's overall level of functioning 3. Treat health problems as indicated. 4. Develop treatment plan to decrease risk of relapse upon discharge and the need for readmission. 5. Psycho-social education regarding relapse prevention and self care. 6. Health care follow up as needed for medical problems. 7. Continue home medications where appropriate. 8. Will continue Zyprexa to 15 mg  Twice daily. 9.Will continue prolixin 10 mg q AM and 10mg  at hs. 10. Will initiate Lithium carbonate 300mg  po BID as requested by her Husband who reported that it helped in the past.  Medical Decision Making Problem Points:  Established problem, worsening (2) and Review of psycho-social stressors (1) Data Points:  Review or order clinical lab tests (1) Review or order medicine tests (1) Review and summation of old records (2) Review of medication regiment & side effects (2)  I certify that inpatient  services furnished can reasonably be expected to improve the patient's condition.    Thedore Mins, MD 02/17/2013 9:19 AM

## 2013-02-17 NOTE — Progress Notes (Signed)
1:1 RN Note Patient resting quietly in room upon my approach. Patient awake and alert, denies any questions/concerns at this time. Patient remains on 1:1 observation for safety. Patient continues to have disorganized thoughts and periods of confused conversation. Patient safe on unit, with Q15 minute safety checks as well. Will continue to monitor.

## 2013-02-17 NOTE — Progress Notes (Signed)
1:1 RN Note Patient remains safe on unit with 1:1 observation for safety. Patient husband visited during visitation hours. Patient continues to be pleasant and cooperative with staff. Patient has periods of confused conversation and bizarre behaviors at times. Patient remains safe on unit with Q15 minute checks for safety. Will continue to monitor.

## 2013-02-17 NOTE — Clinical Social Work Note (Signed)
[facility] Group Notes:  (Counselor/Nursing/MHT/Case Management/Adjunct)  11/04/2012 12:00 PM  Type of Therapy:  Group Therapy  Participation Level:  Active  Participation Quality:  Attentive  Affect:  Anxious  Cognitive:  Disorganized and Delusional  Insight:  Lacking  Engagement in Group:  Engaged  Engagement in Therapy:  Developing/Improving and Engaged  Modes of Intervention:  Clarification, Discussion, Problem-solving, Socialization and Support  Summary of Progress/Problems: RELAPSE: The topic for today was feelings about relapse. Pt discussed what relapse prevention is to them and identified triggers that they are on the path to relapse. Anam arrived late to group but quickly jumped in to the days topic of discussion. She explained that feeling overwhelmed and unable to communicate with her husband often triggers mental health issues with her. Reise participated in the ungame where she was asked about a time where she lost her temper. She explained that when she attempts to explain that she has "lost faith in God" to her husband, he is unresponsive and does not listen to her or her need to talk through this. She was encouraged to write her thoughts and issues down in her journal in order to remember what she wanted to say to her husband about this issue. Adea also talked about a time when she felt most confident--"I feel confident when I can help people and pray for them." However, she became tearful and began to talk about her lack of faith interfering with this.   Smart, Heather N 11/04/2012, 12:00 PM

## 2013-02-17 NOTE — Progress Notes (Signed)
Patient ID: Charlotte [name], female   DOB: 1966/08/12, 47 y.o.   MRN: [id number] 1:1 notes  02/17/2013 @ 0200 D: Patient in bed sleeping. Respiration regular and unlabored. No sign of distress noted at this time A: 1:1 observation for safety R: Patient remains asleep. Sitter at bedside. 1:1 continues

## 2013-02-18 NOTE — Progress Notes (Signed)
Psychoeducational Group Note  Date:  02/18/2013 Time:  0945 am  Group Topic/Focus:  Identifying Needs:   The focus of this group is to help patients identify their personal needs that have been historically problematic and identify healthy behaviors to address their needs.  Participation Level:  Active  Participation Quality:  Intrusive and Redirectable  Affect:  Labile and Not Congruent  Cognitive:  Disorganized  Insight:  Limited  Engagement in Group:  Distracting  Additional Comments:    Andrena Mews 02/18/2013,10:54 AM

## 2013-02-18 NOTE — Progress Notes (Signed)
1:1 nsg note: Met with pt 1:1 who is resting on her bed. Pt is pleasant, calm, cooperative. Affect remains somewhat bizarre. Pt exhibited some light thought blocking. Poor concentration and tangential at times. Endorses AH earlier today but denies at present. Denies VH/SI/HI. 1:1 staff at bedside for safety. Pt remains safe. Charlotte [name]

## 2013-02-18 NOTE — Progress Notes (Signed)
Patient ID: Charlotte [name], female   DOB: 1966-09-20, 47 y.o.   MRN: [id number] 02-18-2013 @ 1400 nursing 1:1 note: D: sitter stated that pt ate 100 % of her lunch and took in her fluids. A: staff praised pt for eating well. RN took husband back on the unit per order to eat with this patient. RN encouraged pt to take in more fluids.  She has urinated x2 and had not had a b/m yet. R: pt seems to eat better when her husband is present. Patient stated she would be sure to take in more fluids. 1:1 continues and was renewed.

## 2013-02-18 NOTE — Progress Notes (Signed)
Patient ID: Charlotte [name], female   DOB: 1966/06/03, 47 y.o.   MRN: [id number] [facility] MD Progress Note  Subjective: "I hear my father voice, I believe he is very angry on me"  Objective: Patient seen and chart reviewed.  Patient is still on 1;1 and remains disorganized.  She was tearful and labile.  She continued to endorse that she is pregnant especially.  Recently she's been hitting her father's voice who she believes very angry on her.  She sleeps on and off.  She appears despondent internal stimuli.  She appears restless and seen moving her clothes from one place to another place.  She remains disorganized and psychotic.  She is easily tearful and crying but did not provide much reason.    Diagnosis:  Schizoaffective disorder Bipolar type  ADL's:  Intact  Sleep: Fair  Appetite:  Poor  Suicidal Ideation:  Denies.   Homicidal Ideation:  Denies.   AEB (as evidenced by): patient's response, affect, and participation in unit programming.  Review of Systems  Constitutional: Negative.  Negative for fever, chills, weight loss, malaise/fatigue and diaphoresis.  HENT: Negative for congestion and sore throat.   Eyes: Negative for blurred vision, double vision and photophobia.  Respiratory: Negative for cough, shortness of breath and wheezing.   Cardiovascular: Negative for chest pain, palpitations and PND.  Gastrointestinal: Negative for heartburn, nausea, vomiting, abdominal pain, diarrhea and constipation.  Musculoskeletal: Negative for myalgias, joint pain and falls.  Neurological: Positive for headaches. Negative for dizziness, tingling, tremors, sensory change, speech change, focal weakness, seizures, loss of consciousness and weakness.  Endo/Heme/Allergies: Negative for polydipsia. Does not bruise/bleed easily.  Psychiatric/Behavioral: Positive for hallucinations. Negative for depression, suicidal ideas, memory loss and substance abuse. The patient is nervous/anxious and has insomnia.       Disorganized thinking   Blood pressure 107/75, pulse 93, temperature 97.4 F (36.3 C), temperature source Oral, resp. rate 18, height 5\' 5"  (1.651 m), weight 81.647 kg (180 lb).Body mass index is 29.95 kg/(m^2).  General Appearance: Disheveled with poor hygiene Behavior: unpredictable, intrusive  Eye Contact::  Fair  Speech:  Rambles and is tangential and loose  Volume:  Normal  Mood:  Anxious and Depressed  Affect:  flat  Thought Process:  Disorganized,    Orientation:  NA  Thought Content:  Hallucinations: patient appears to be responding to internal stimuli  Suicidal Thoughts:  Denies.    Homicidal Thoughts: Denies.    Memory:  NA  Judgement:  Impaired  Insight:  Lacking  Psychomotor Activity:  Psychomotor Retardation,   Concentration:  Poor  Recall:  Poor  Akathisia:  No  Handed:  Right  AIMS (if indicated):     Assets:  Desire for Improvement  Sleep:  Number of Hours: 3.25   Current Medications: Current Facility-Administered Medications  Medication Dose Route Frequency Provider Last Rate Last Dose  . acetaminophen (TYLENOL) tablet 650 mg  650 mg Oral Q6H PRN Sanjuana Kava, NP      . alum & mag hydroxide-simeth (MAALOX/MYLANTA) 200-200-20 MG/5ML suspension 30 mL  30 mL Oral Q4H PRN Sanjuana Kava, NP      . benztropine (COGENTIN) tablet 1 mg  1 mg Oral BID Verne Spurr, PA-C   1 mg at 02/05/13 0802  . fluPHENAZine (PROLIXIN) tablet 10 mg  10 mg Oral QHS Verne Spurr, PA-C   10 mg at 02/04/13 2129  . hydrOXYzine (ATARAX/VISTARIL) tablet 25 mg  25 mg Oral Q4H PRN Sanjuana Kava, NP  25 mg at 02/04/13 1814  . magnesium hydroxide (MILK OF MAGNESIA) suspension 30 mL  30 mL Oral Daily PRN Sanjuana Kava, NP      . OLANZapine zydis (ZYPREXA) disintegrating tablet 30 mg  30 mg Oral QHS Verne Spurr, PA-C      . valproic acid (DEPAKENE) 250 MG capsule 250 mg  250 mg Oral QHS Verne Spurr, PA-C   250 mg at 02/04/13 2129  . venlafaxine XR (EFFEXOR-XR) 24 hr capsule 37.5 mg  37.5  mg Oral q morning - 10a Verne Spurr, PA-C   37.5 mg at 02/05/13 0865    Lab Results: No results found for this or any previous visit (from the past 48 hour(s)).  Physical Findings: AIMS: Facial and Oral Movements Muscles of Facial Expression: None, normal Lips and Perioral Area: None, normal Jaw: None, normal Tongue: None, normal,Extremity Movements Upper (arms, wrists, hands, fingers): None, normal Lower (legs, knees, ankles, toes): None, normal, Trunk Movements Neck, shoulders, hips: None, normal, Overall Severity Severity of abnormal movements (highest score from questions above): None, normal Incapacitation due to abnormal movements: None, normal Patient's awareness of abnormal movements (rate only patient's report): No Awareness, Dental Status Current problems with teeth and/or dentures?: No Does patient usually wear dentures?: No  CIWA:  CIWA-Ar Total: 0 COWS:  COWS Total Score: 0  Treatment Plan: Continue current treatment at this time.  Patient is still needs stabilization and one-to-one for her disorganized behavior and thinking.  Continue Prolixin and all other psychotropic medication.  We will continue crisis management and stabilization.  Continue to encourage to participate in group milieu therapy.  Continue Psychosocial education regarding relapse prevention in self-care.  Today to followup on her placement and discharge planning.  Medical Decision Making Problem Points:  Established problem, worsening (2) and Review of psycho-social stressors (1) Data Points:  Review or order clinical lab tests (1) Review or order medicine tests (1) Review and summation of old records (2) Review of medication regiment & side effects (2)  I certify that inpatient services furnished can reasonably be expected to improve the patient's condition.   9:10 AM 02/18/2013

## 2013-02-18 NOTE — Progress Notes (Signed)
Patient ID: Charlotte [name], female   DOB: 20-Apr-1966, 47 y.o.   MRN: [id number] 02-18-13 @ 1000 Nursing 1:1 note: D: pt didn't not eat breakfast this am. She took a shower and has not had a b/m. She is religiously preoccupied. A: after encouragement she came to group while on her 1:1. rn spoke with her husband this am to give him a progress report. R: pt has been cooperative, with frequent redirections. Husband stated he would be coming to have lunch with the patient. rn reviewed the patients medications with husband. 1:1 continues.

## 2013-02-18 NOTE — Progress Notes (Signed)
Patient ID: Charlotte [name], female   DOB: November 22, 1966, 47 y.o.   MRN: [id number] 02-18-2013 @1800  nursing 1:1 note: pt ate her dinner while her husband was visiting. She ate most of her meals and is taking in more fluids.  She has been more focused and less confused today.she is taking her medications without any resistant.  A: while talking with the patient she is still religiously preoccupied. R: RN will will continues to support and encourage. 1:1 continues with the sitter.

## 2013-02-18 NOTE — Clinical Social Work Note (Signed)
[facility] Group Notes: (Clinical Social Work)   02/18/2013      Type of Therapy:  Group Therapy   Participation Level:  Did Not Attend    Ambrose Mantle, LCSW 02/18/2013, 1:00 PM

## 2013-02-18 NOTE — Progress Notes (Signed)
Patient ID: Charlotte [name], female   DOB: 1966/09/22, 47 y.o.   MRN: [id number] 1:1 notes  02/18/2013 @ 0200 D: Patient awake sitting on bed in room. Pt stated she is not sleepy. Pt is calm and denies pain. No sign of distress noted at this time A: medication administered for anxiety @ 0048. 1:1 observation for safety R: Patient remains safe. Sitter at bedside. 1:1 continues

## 2013-02-18 NOTE — Progress Notes (Signed)
Patient ID: Charlotte [name], female   DOB: 12-Apr-1966, 47 y.o.   MRN: [id number] Psychoeducational Group Note  Date:  02/18/2013 Time:0930am  Group Topic/Focus:  Identifying Needs:   The focus of this group is to help patients identify their personal needs that have been historically problematic and identify healthy behaviors to address their needs.  Participation Level:  Active  Participation Quality:  Redirectable  Affect:  Anxious  Cognitive:  Disorganized  Insight:  Developing/Improving  Engagement in Group:  Developing/Improving  Additional Comments:  Inventory group   Valente David 02/18/2013,10:08 AM

## 2013-02-18 NOTE — Progress Notes (Signed)
Patient ID: Charlotte [name], female   DOB: Jul 14, 1966, 47 y.o.   MRN: [id number] 1:1 notes  02/18/2013 @ 0600 D: Patient in bed sleeping. Respiration regular and unlabored. No sign of distress noted at this time A: 1:1 observation for safety R: Patient remains asleep. Sitter at bedside. 1:1 continues

## 2013-02-19 NOTE — Progress Notes (Signed)
Pt is sitting in the dayroom watching TV. No complaints voiced, no distress noted. 1:1 staff at side. Continue 1:1 obs for safety. Pt is safe. Charlotte [name]

## 2013-02-19 NOTE — Progress Notes (Signed)
Patient ID: Charlotte [name], female   DOB: 1966/02/25, 47 y.o.   MRN: [id number] Psychoeducational Group Note  Date:  02/19/2013 Time:  0930am  Group Topic/Focus:  Making Healthy Choices:   The focus of this group is to help patients identify negative/unhealthy choices they were using prior to admission and identify positive/healthier coping strategies to replace them upon discharge.  Participation Level:  Active  Participation Quality:  Intrusive  Affect:  Anxious  Cognitive:  Disorganized  Insight:  Distracting  Engagement in Group:  Monopolizing  Additional Comments:inventory group                                                                Valente David 02/19/2013,10:11 AM

## 2013-02-19 NOTE — Progress Notes (Signed)
Patient ID: Charlotte [name], female   DOB: 12/17/1966, 47 y.o.   MRN: [id number] 02-19-2013 at 1400: nursing 1:1 note: d: pt continues to be delusional. Pt is still disorganized in her thinking and very intrusive. She ate 50% of her lunch and has taken in 360cc of fluids.  A: staff continues to redirect, set limits and reorient this pt. R: she responds well to redirection, it smiling inappropriate at times, and continues on the 1:1.

## 2013-02-19 NOTE — Progress Notes (Signed)
Psychoeducational Group Note  Date:  02/19/2013 Time:  0945 am  Group Topic/Focus:  Making Healthy Choices:   The focus of this group is to help patients identify negative/unhealthy choices they were using prior to admission and identify positive/healthier coping strategies to replace them upon discharge.  Participation Level:  Active  Participation Quality:  Redirectable  Affect:  Labile  Cognitive:  Disorganized  Insight:  Lacking  Engagement in Group:  Monopolizing  Additional Comments:    Andrena Mews 02/19/2013, 10:29 AM

## 2013-02-19 NOTE — Progress Notes (Signed)
Patient ID: Charlotte [name], female   DOB: 1966-01-31, 47 y.o.   MRN: [id number] [facility] MD Progress Note  Subjective: "I am very scared , I still hear my father voice"  Objective: Patient seen and chart reviewed.  Patient is still on 1;1 and remains disorganized.  She continues to endorse hallucination off his father.  Recently she is also notice that her mother is calling her.  She told that she was born without wedding.  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  She remains disorganized and believe that she is pregnant.  Her sleep is better.  She remains intrusive and appears that she is in responding to internal stimuli.  She appears restless and seen moving her clothes from one place to another place.  She remains disorganized and psychotic.  She is easily tearful and crying but did not provide much reason.    Diagnosis:  Schizoaffective disorder Bipolar type  ADL's:  Intact  Sleep: Fair  Appetite:  Poor  Suicidal Ideation:  Denies.   Homicidal Ideation:  Denies.   AEB (as evidenced by): patient's response, affect, and participation in unit programming.  Review of Systems  Constitutional: Negative.  Negative for fever, chills, weight loss, malaise/fatigue and diaphoresis.  HENT: Negative for congestion and sore throat.   Eyes: Negative for blurred vision, double vision and photophobia.  Respiratory: Negative for cough, shortness of breath and wheezing.   Cardiovascular: Negative for chest pain, palpitations and PND.  Gastrointestinal: Negative for heartburn, nausea, vomiting, abdominal pain, diarrhea and constipation.  Musculoskeletal: Negative for myalgias, joint pain and falls.  Neurological: Positive for headaches. Negative for dizziness, tingling, tremors, sensory change, speech change, focal weakness, seizures, loss of consciousness and weakness.  Endo/Heme/Allergies: Negative for polydipsia. Does not bruise/bleed easily.  Psychiatric/Behavioral: Positive for hallucinations.  Negative for depression, suicidal ideas, memory loss and substance abuse. The patient is nervous/anxious and has insomnia.        Disorganized thinking   Blood pressure 107/75, pulse 93, temperature 97.4 F (36.3 C), temperature source Oral, resp. rate 18, height 5\' 5"  (1.651 m), weight 81.647 kg (180 lb).Body mass index is 29.95 kg/(m^2).  General Appearance: Disheveled with poor hygiene Behavior: unpredictable, intrusive  Eye Contact::  Fair  Speech:  Rambles and is tangential and loose  Volume:  Normal  Mood:  Anxious and Depressed  Affect:  flat  Thought Process:  Disorganized,    Orientation:  NA  Thought Content:  Hallucinations: patient appears to be responding to internal stimuli  Suicidal Thoughts:  Denies.    Homicidal Thoughts: Denies.    Memory:  NA  Judgement:  Impaired  Insight:  Lacking  Psychomotor Activity:  Psychomotor Retardation,   Concentration:  Poor  Recall:  Poor  Akathisia:  No  Handed:  Right  AIMS (if indicated):     Assets:  Desire for Improvement  Sleep:  Number of Hours: 3.25   Current Medications: Current Facility-Administered Medications  Medication Dose Route Frequency Provider Last Rate Last Dose  . acetaminophen (TYLENOL) tablet 650 mg  650 mg Oral Q6H PRN Sanjuana Kava, NP      . alum & mag hydroxide-simeth (MAALOX/MYLANTA) 200-200-20 MG/5ML suspension 30 mL  30 mL Oral Q4H PRN Sanjuana Kava, NP      . benztropine (COGENTIN) tablet 1 mg  1 mg Oral BID Verne Spurr, PA-C   1 mg at 02/05/13 0802  . fluPHENAZine (PROLIXIN) tablet 10 mg  10 mg Oral QHS Verne Spurr, PA-C  10 mg at 02/04/13 2129  . hydrOXYzine (ATARAX/VISTARIL) tablet 25 mg  25 mg Oral Q4H PRN Sanjuana Kava, NP   25 mg at 02/04/13 1814  . magnesium hydroxide (MILK OF MAGNESIA) suspension 30 mL  30 mL Oral Daily PRN Sanjuana Kava, NP      . OLANZapine zydis (ZYPREXA) disintegrating tablet 30 mg  30 mg Oral QHS Verne Spurr, PA-C      . valproic acid (DEPAKENE) 250 MG capsule  250 mg  250 mg Oral QHS Verne Spurr, PA-C   250 mg at 02/04/13 2129  . venlafaxine XR (EFFEXOR-XR) 24 hr capsule 37.5 mg  37.5 mg Oral q morning - 10a Verne Spurr, PA-C   37.5 mg at 02/05/13 1610    Lab Results: No results found for this or any previous visit (from the past 48 hour(s)).  Physical Findings: AIMS: Facial and Oral Movements Muscles of Facial Expression: None, normal Lips and Perioral Area: None, normal Jaw: None, normal Tongue: None, normal,Extremity Movements Upper (arms, wrists, hands, fingers): None, normal Lower (legs, knees, ankles, toes): None, normal, Trunk Movements Neck, shoulders, hips: None, normal, Overall Severity Severity of abnormal movements (highest score from questions above): None, normal Incapacitation due to abnormal movements: None, normal Patient's awareness of abnormal movements (rate only patient's report): No Awareness, Dental Status Current problems with teeth and/or dentures?: No Does patient usually wear dentures?: No  CIWA:  CIWA-Ar Total: 0 COWS:  COWS Total Score: 0  Treatment Plan: Continue current treatment at this time.  Patient is still needs stabilization and one-to-one for her disorganized behavior and thinking.  Continue Prolixin and all other psychotropic medication.  We will continue crisis management and stabilization.  Continue to encourage to participate in group milieu therapy.  Continue Psychosocial education regarding relapse prevention in self-care.  Today to followup on her placement and discharge planning.  Medical Decision Making Problem Points:  Established problem, worsening (2) and Review of psycho-social stressors (1) Data Points:  Review or order clinical lab tests (1) Review or order medicine tests (1) Review and summation of old records (2) Review of medication regiment & side effects (2)  I certify that inpatient services furnished can reasonably be expected to improve the patient's condition.   Kathryne Sharper, MD 9:39 AM 02/19/2013

## 2013-02-19 NOTE — Progress Notes (Signed)
Spoke with pt 1:1 who initially stated, "I do not want to take any more meds. I have a positive attitude and I want it to remain." Pt resistant even with this writer's attempts to educate, reinforce need for compliance now and after discharge. Pt then became distracted and stated, "see these goldfish? I've been praying for them. The lady put them there this morning and was sensitive. I'm sorry for not being sensitive" and shook this writer's hand. Some thought blocking noted. Pt given support by this Clinical research associate and MHT and did consent to medications which she took without difficulty. She denies SI/HI but is + for AH. Will continue the 1:1 observation for safety. Pt is safe. Lawrence Marseilles

## 2013-02-19 NOTE — Progress Notes (Signed)
Pt observed resting in bed with eyes closed. RR WNL, even and unlabored. No distress noted. Level I obs in place for safety and pt remains safe. Lawrence Marseilles

## 2013-02-19 NOTE — Progress Notes (Signed)
Patient ID: Charlotte [name], female   DOB: 1966-06-09, 47 y.o.   MRN: [id number] 02-19-13 @ 1000 nursing 1:1 note: D: pt has a tendency to ramble and become hyper religious. She remains confused. She ate her breakfast, took medications and came to groups.  A: staff has been supportive and redirected the patient throughout the day, particularly in group. R: she responds well to redirection. 1:1 continues.

## 2013-02-19 NOTE — Clinical Social Work Note (Signed)
[facility] Group Notes:  (Clinical Social Work)  02/19/2013   11:15-11:45AM  Summary of Progress/Problems:  The main focus of today's process group was to listen to a variety of genres of music and to identify that different types of music provoke different responses.  The patient then was able to identify personally what was soothing for them, as well as energizing.  Handouts were used to record feelings evoked, as well as how patient can personally use this knowledge in sleep habits, with depression, and with other symptoms.  The patient expressed enjoyment of much of the music, playing the air drums and smiling broadly throughout many of the songs.  She sang at the beginning of the opera music, but did stop herself appropriately when the singer began.  During the disco music, she got up and danced to the beat.  She had times of talking when the music was playing, which distracted others, but she was redirectable.  She appeared more organized in her speech and thinking.  Type of Therapy:  Music Therapy with processing done  Participation Level:  Active  Participation Quality:  Attentive and Sharing  Affect:  Excited and Flat  Cognitive:  Appropriate  Insight:  Developing/Improving  Engagement in Therapy:  Developing/Improving  Modes of Intervention:   Socialization, Support and Processing, Exploration, Education, Rapport Building   [employer], LCSW 02/19/2013, 12:36 PM

## 2013-02-19 NOTE — Progress Notes (Signed)
Patient ID: Charlotte [name], female   DOB: 02/03/1966, 47 y.o.   MRN: [id number] 02-19-13 @ 1800 nursing 1:1 note: D: pt ate 100% of her dinner and took in amble fluids. She got a ativan prn for her anxiety. She remains confused and disorganized, but pleasant. A staff continues to support and encourage pt. She is redirectable and need positive reinforcement. R: the ativan reduced her anxiety and she is receptive to redirection. The 1:1 continues.

## 2013-02-20 NOTE — Progress Notes (Signed)
Patient ID: Charlotte [name], female   DOB: 01-01-1966, 47 y.o.   MRN: [id number] D: Pt. In room with 1:1, reports doing well. "I didn't do it, I just told her about it" Sitter reports client told her she had been eating buggers. Sitter redirected. Pt. Then says she called [facility] and is praying for this facility. A: Writer introduced self to client, discussed meds and ask her to come to med window. Pt. Encouraged to report concerns. Pt. Will be monitored 1:1. R: Pt. Is safe on the unit. Pt. Comes takes meds without hesitance.

## 2013-02-20 NOTE — Progress Notes (Signed)
Patient ID: Charlotte [name], female   DOB: 01-Aug-1966, 47 y.o.   MRN: [id number] 1:1 observational note:  Patient is less agitated and calmer.  Patient was given ativan 2 mg earlier this evening and she has been less anxious.  Patient had been going into other patient's room, being intrusive with peers and staff.  Her thought processes remain disorganized; eye contact good.  Patient is very childlike in her answers and behavior.  She denies any SI/HI/AVH.

## 2013-02-20 NOTE — Clinical Social Work Note (Signed)
  Type of Therapy: Process Group Therapy  Participation Level:  Did Not Attend    Summary of Progress/Problems: Today's group addressed the issue of overcoming obstacles.  Patients were asked to identify their biggest obstacle post d/c that stands in the way of their on-going success, and then problem solve as to how to manage this.       Ida Rogue 02/20/2013   3:51 PM

## 2013-02-20 NOTE — Progress Notes (Signed)
[facility] LCSW Aftercare Discharge Planning Group Note  02/20/2013 12:30 PM  Participation Quality:  Appropriate  Affect:  Appropriate  Cognitive:  Disorganized  Insight:  None  Engagement in Group:  Limited  Modes of Intervention:  Discussion, Exploration and Socialization  Summary of Progress/Problems: Charlotte [name] initially wasn't sure if she wanted to stay for group or not, but ultimately decided to stay.  She made a number of nonsensical statements related to nothing we were talking about.  Wants me to talk to her husband.  I assured her I see him on a daily basis.  "No, I mean he needs to come to this group!"  [name] B 02/20/2013, 12:30 PM

## 2013-02-20 NOTE — Progress Notes (Signed)
Patient ID: Charlotte [name], female   DOB: 08-Sep-1966, 47 y.o.   MRN: [id number] 1:1 observational note: patient remains religiously preoccupied; this morning her mood was elevated.  Patient has tendency to touch and hug staff.  Asked nurse this morning to "pray with her."  Patient is compliant with her medications; resistant at times.  Patient stated that there would be no one to assist her with her meds when she is discharged; I explained she will have an ACT team to assist her.  Discharge plan is to possibly discharge home by the end of the week.  Patient will be discharged home.  She denies any SI/HI/AVH.    Continue to monitor medication management and MD orders.  Safety checks continued every 15 minutes per protocol.  Patient's behavior is redirectable.  Remains 1:1 with sitter due behavior.

## 2013-02-20 NOTE — Progress Notes (Signed)
Adult Psychoeducational Group Note  Date:  02/20/2013 Time:  11:00am  Group Topic/Focus:  Wellness Toolbox:   The focus of this group is to discuss various aspects of wellness, balancing those aspects and exploring ways to increase the ability to experience wellness.  Patients will create a wellness toolbox for use upon discharge.  Participation Level:  Did Not Attend  Participation Quality:    Affect:    Cognitive:    Insight:   Engagement in Group:    Modes of Intervention:    Additional Comments: pt was in the shower , didn't attend.  Isla Pence M 02/20/2013, 12:07 PM

## 2013-02-20 NOTE — Progress Notes (Signed)
Patient ID: Charlotte [name], female   DOB: 06/20/1966, 47 y.o.   MRN: [id number] [facility] MD Progress Note  Subjective: "I just want a better life for everyone." Kenzy was sleeping soundly, slow to awaken but was able to respond to questions. She stated she did not sleep well.  Objective: Karmin is still disorganized and demonstrates some thought blocking. Denies new symptoms.   Diagnosis:  Schizoaffective disorder Bipolar type  ADL's:  Intact  Sleep: "not well last night."  Appetite:  Poor  Suicidal Ideation:  Denies.   Homicidal Ideation:  Denies.   AEB (as evidenced by): patient's response, affect, and participation in unit programming.  Review of Systems  Constitutional: Negative.  Negative for fever, chills, weight loss, malaise/fatigue and diaphoresis.  HENT: Negative for congestion and sore throat.   Eyes: Negative for blurred vision, double vision and photophobia.  Respiratory: Negative for cough, shortness of breath and wheezing.   Cardiovascular: Negative for chest pain, palpitations and PND.  Gastrointestinal: Negative for heartburn, nausea, vomiting, abdominal pain, diarrhea and constipation.  Musculoskeletal: Negative for myalgias, joint pain and falls.  Neurological: Negative for dizziness, tingling, tremors, sensory change, speech change, focal weakness, seizures, loss of consciousness and weakness.  Endo/Heme/Allergies: Negative for polydipsia. Does not bruise/bleed easily.  Psychiatric/Behavioral: Positive for hallucinations. Negative for depression, suicidal ideas, memory loss and substance abuse. The patient is nervous/anxious and has insomnia.        Disorganized thinking   Blood pressure 107/75, pulse 93, temperature 97.4 F (36.3 C), temperature source Oral, resp. rate 18, height 5\' 5"  (1.651 m), weight 81.647 kg (180 lb).Body mass index is 29.95 kg/(m^2).  General Appearance: Disheveled with poor hygiene Behavior: unpredictable, intrusive  Eye Contact::  Fair   Speech:  Rambles and is tangential and loose  Volume:  Normal  Mood:  Anxious and Depressed  Affect:  flat  Thought Process:  Disorganized,  Thought blocking  Orientation:  NA  Thought Content:  Hallucinations: patient appears to be responding to internal stimuli  Suicidal Thoughts:  Denies.    Homicidal Thoughts: Denies.    Memory:  NA  Judgement:  Impaired  Insight:  Lacking  Psychomotor Activity:  Psychomotor Retardation,   Concentration:  Poor  Recall:  Poor  Akathisia:  No  Handed:  Right  AIMS (if indicated):     Assets:  Desire for Improvement  Sleep:  Number of Hours: 3.25   Current Medications: Current Facility-Administered Medications  Medication Dose Route Frequency Provider Last Rate Last Dose  . acetaminophen (TYLENOL) tablet 650 mg  650 mg Oral Q6H PRN Sanjuana Kava, NP      . alum & mag hydroxide-simeth (MAALOX/MYLANTA) 200-200-20 MG/5ML suspension 30 mL  30 mL Oral Q4H PRN Sanjuana Kava, NP      . benztropine (COGENTIN) tablet 1 mg  1 mg Oral BID Verne Spurr, PA-C   1 mg at 02/05/13 0802  . fluPHENAZine (PROLIXIN) tablet 10 mg  10 mg Oral QHS Verne Spurr, PA-C   10 mg at 02/04/13 2129  . hydrOXYzine (ATARAX/VISTARIL) tablet 25 mg  25 mg Oral Q4H PRN Sanjuana Kava, NP   25 mg at 02/04/13 1814  . magnesium hydroxide (MILK OF MAGNESIA) suspension 30 mL  30 mL Oral Daily PRN Sanjuana Kava, NP      . OLANZapine zydis (ZYPREXA) disintegrating tablet 30 mg  30 mg Oral QHS Verne Spurr, PA-C      . valproic acid (DEPAKENE) 250 MG capsule 250 mg  250 mg Oral QHS Verne Spurr, PA-C   250 mg at 02/04/13 2129  . venlafaxine XR (EFFEXOR-XR) 24 hr capsule 37.5 mg  37.5 mg Oral q morning - 10a Verne Spurr, PA-C   37.5 mg at 02/05/13 4540    Lab Results: No results found for this or any previous visit (from the past 48 hour(s)).  Physical Findings: AIMS: Facial and Oral Movements Muscles of Facial Expression: None, normal Lips and Perioral Area: None, normal Jaw:  None, normal Tongue: None, normal,Extremity Movements Upper (arms, wrists, hands, fingers): None, normal Lower (legs, knees, ankles, toes): None, normal, Trunk Movements Neck, shoulders, hips: None, normal, Overall Severity Severity of abnormal movements (highest score from questions above): None, normal Incapacitation due to abnormal movements: None, normal Patient's awareness of abnormal movements (rate only patient's report): No Awareness, Dental Status Current problems with teeth and/or dentures?: No Does patient usually wear dentures?: No  CIWA:  CIWA-Ar Total: 0 COWS:  COWS Total Score: 0  Treatment Plan: Continue current treatment at this time.  Patient is still needs stabilization and one-to-one for her disorganized behavior and thinking.  Continue Prolixin and all other psychotropic medication.  We will continue crisis management and stabilization.  Continue to encourage to participate in group milieu therapy.  Continue Psychosocial education regarding relapse prevention in self-care.  Today to followup on her placement and discharge planning.  Medical Decision Making Problem Points:  Established problem, worsening (2) and Review of psycho-social stressors (1) Data Points:  Review or order clinical lab tests (1) Review or order medicine tests (1) Review and summation of old records (2) Review of medication regiment & side effects (2)  I certify that inpatient services furnished can reasonably be expected to improve the patient's condition.  Rona Ravens. Victory Strollo RPAC 2:21 PM 02/20/2013

## 2013-02-20 NOTE — Progress Notes (Signed)
Pt continues to rest without difficulty. RR even, unlabored, WNL. Level I obs remains in place for safety and pt is safe. Lawrence Marseilles

## 2013-02-20 NOTE — Progress Notes (Signed)
Pt observed resting in bed with eyes closed. RR WNL, even and unlabored. Audible snoring. Level I obs in place for safety. Pt remains safe. Lawrence Marseilles

## 2013-02-20 NOTE — Progress Notes (Signed)
Patient ID: Charlotte [name], female   DOB: [birth year], 47 y.o.   MRN: [id number] 1:1 observational note: patient remain disorganized; tangental speech.  She has been entering other patient's room and has to be redirected.  She is intrusive, pushing her boundaries with others.  Patient remains religiously preoccupied.  She denies any SI/HI/AVH.  Patient was given ativan 2 mg for agitation.

## 2013-02-20 NOTE — Treatment Plan (Signed)
  Interdisciplinary Treatment Plan Update   Date Reviewed:  02/20/2013  Time Reviewed:  3:52 PM  Progress in Treatment:   Attending groups: Yes Participating in groups: Minimally Taking medication as prescribed: Yes  Tolerating medication: Yes Family/Significant other contact made: Yes  Patient understands diagnosis: No  Limited insight Discussing patient identified problems/goals with staff: Yes  See initial tx plan Medical problems stabilized or resolved: Yes Denies suicidal/homicidal ideation: Yes Patient has not harmed self or others: Yes  For review of initial/current patient goals, please see plan of care.  Estimated Length of Stay:  4-5 days  Reason for Continuation of Hospitalization: Medication stabilization Other; describe Disorganized thinking Intrusive behavior  New Problems/Goals identified:  N/A  Discharge Plan or Barriers:   return home,  Follow up ACT team  Additional Comments: Charlotte [name] continues with disorganized thinking and intrusive behavior despite the introduction of a second anti-psychotic medication. She was started on Lithium late last week, and it is being increased to address mood symptoms.  Attendees:  Signature: Thedore Mins, MD 02/20/2013 3:52 PM   Signature: Richelle Ito, LCSW 02/20/2013 3:52 PM  Signature: Verne Spurr, PA 02/20/2013 3:52 PM  Signature: Joslyn Devon, RN 02/20/2013 3:52 PM  Signature:  02/20/2013 3:52 PM  Signature:  02/20/2013 3:52 PM  Signature:   02/20/2013 3:52 PM  Signature:    Signature:    Signature:    Signature:    Signature:    Signature:      Scribe for Treatment Team:   Richelle Ito, LCSW  02/20/2013 3:52 PM

## 2013-02-21 MED ORDER — TRIHEXYPHENIDYL HCL 5 MG PO TABS
5.0000 mg | ORAL_TABLET | Freq: Two times a day (BID) | ORAL | Status: DC
  Administered 2013-02-21 – 2013-02-23 (×4): 5 mg via ORAL
  Filled 2013-02-21 (×6): qty 1
  Filled 2013-02-21 (×2): qty 28

## 2013-02-21 NOTE — Progress Notes (Signed)
Patient ID: Charlotte [name], female   DOB: 02/19/1966, 47 y.o.   MRN: [id number] Patient is in room visiting with husband and eating her dinner.  She had been resting quietly earlier this evening.  Patient's husband appears frustrated with her while she was taking her medication.  She stated that the medication "makes me feel differently toward you."  Patient was visibly anxious over her visit.  Continue 1;1 observation for behavior redirection.

## 2013-02-21 NOTE — Progress Notes (Signed)
Note @ 2200 D/T 1-1. D; On initial assessment pt was dancing around the room  & demonstrating very bizzarre behaviors. @ this time pt. seems calmer & is sitting in the chair in her room journalling.Pt continues to be very disorganized.Pt took her HS medications @ 2010 without a problem.A: 1-1 continues d/t pt,s bizzarre  & unpredictable behaviors. R: Pt safety maintained.

## 2013-02-21 NOTE — Progress Notes (Signed)
Patient ID: Charlotte [name], female   DOB: March 10, 1966, 47 y.o.   MRN: [id number] Patient is improving with treatment; she is less animated and her behavior is less bizzare.  Patient was able to have a logical, goal directed conversation with staff.  Her goal is to practice her boundaries today.  She denies any SI/HI/AVH.  If patient continues to improve with less intrusiveness today, she may come off the 1:1 observation tomorrow per MD.  Patient is compliant with her medications; she is aware of what she is taking.  Medication ordered by MD for moderate tremors in her hands.  Continue to monitor mediation management and MD orders.  Safety checks continued every 15 minutes per protocol.  Continue to monitor patient's behavior and redirect as necessary.

## 2013-02-21 NOTE — Progress Notes (Signed)
Date: 02/21/2013  Time: 8:50 PM  Group Topic/Focus:  Wrap-Up Group: The focus of this group is to help patients review their daily goal of treatment and discuss progress on daily workbooks.  Participation Level: Active  Participation Quality: Appropriate, Sharing and Supportive  Affect: Appropriate  Cognitive: Appropriate  Insight: Appropriate  Engagement in Group: Engaged and Supportive  Modes of Intervention: Education and Support  Additional Comments: Pt stated that goals that were set was achieved.  Isla Pence M  02/21/2013, 8:50 PM

## 2013-02-21 NOTE — Progress Notes (Signed)
Patient ID: Charlotte [name], female   DOB: [birth year], 47 y.o.   MRN: [id number] [facility] MD Progress Note  02/21/2013 11:12 AM Cathe Bilger  MRN:  [id number]  Subjective: Patient continue to report that some spirits and demons lives inside her. She remains  delusional, disorganized and bizarre. Patient is less agitated but her behavior remains unpredictable. She is on 1:1 observation for safety. She is observed to have bilateral hand tremors.  Diagnosis:  Schizoaffective disorder Bipolar type  ADL's:  Intact  Sleep: Fair  Appetite:  fair  Suicidal Ideation:  Unable to assess Homicidal Ideation:  Unable to assess AEB (as evidenced by): patient's response, affect, and participation in unit programming.  Psychiatric Specialty Exam: Review of Systems  Constitutional: Negative.  Negative for fever, chills, weight loss, malaise/fatigue and diaphoresis.  HENT: Negative for congestion and sore throat.   Eyes: Negative for blurred vision, double vision and photophobia.  Respiratory: Negative for cough, shortness of breath and wheezing.   Cardiovascular: Negative for chest pain, palpitations and PND.  Gastrointestinal: Negative for heartburn, nausea, vomiting, abdominal pain, diarrhea and constipation.  Musculoskeletal: Negative for myalgias, joint pain and falls.  Neurological: Negative for dizziness, tingling, tremors, sensory change, speech change, focal weakness, seizures, loss of consciousness, weakness and headaches.  Endo/Heme/Allergies: Negative for polydipsia. Does not bruise/bleed easily.  Psychiatric/Behavioral: Positive for hallucinations. Negative for depression, suicidal ideas, memory loss and substance abuse. The patient is nervous/anxious. The patient does not have insomnia.     Blood pressure 127/86, pulse 108, temperature 97.8 F (36.6 C), temperature source Oral, resp. rate 20, height 5\' 5"  (1.651 m), weight 81.647 kg (180 lb).Body mass index is 29.95 kg/(m^2).  General  Appearance: fairly groomed  Patent attorney::  Fair  Speech:  Slow but clear  Volume:  Normal  Mood:  Anxious and Depressed  Affect:  Flat  Thought Process:  Disorganized  Orientation:  NA  Thought Content:  Hallucinations: patient appears to be responding to internal stimuli  Suicidal Thoughts:  I don't know  Homicidal Thoughts:  I don't know  Memory:  NA  Judgement:  Impaired  Insight:  Lacking  Psychomotor Activity:  Psychomotor Retardation  Concentration:  Poor  Recall:  Poor  Akathisia:  No  Handed:  Right  AIMS (if indicated):     Assets:  Desire for Improvement  Sleep:  Number of Hours: 4.5   Current Medications: Current Facility-Administered Medications  Medication Dose Route Frequency Pearly [place] Last Rate Last Dose  . acetaminophen (TYLENOL) tablet 650 mg  650 mg Oral Q6H PRN Sanjuana Kava, NP      . alum & mag hydroxide-simeth (MAALOX/MYLANTA) 200-200-20 MG/5ML suspension 30 mL  30 mL Oral Q4H PRN Sanjuana Kava, NP      . benztropine (COGENTIN) tablet 1 mg  1 mg Oral BID Verne Spurr, PA-C   1 mg at 02/05/13 0802  . fluPHENAZine (PROLIXIN) tablet 10 mg  10 mg Oral QHS Verne Spurr, PA-C   10 mg at 02/04/13 2129  . hydrOXYzine (ATARAX/VISTARIL) tablet 25 mg  25 mg Oral Q4H PRN Sanjuana Kava, NP   25 mg at 02/04/13 1814  . magnesium hydroxide (MILK OF MAGNESIA) suspension 30 mL  30 mL Oral Daily PRN Sanjuana Kava, NP      . OLANZapine zydis (ZYPREXA) disintegrating tablet 30 mg  30 mg Oral QHS Verne Spurr, PA-C      . valproic acid (DEPAKENE) 250 MG capsule 250 mg  250 mg Oral QHS Lloyd Huger  Mashburn, PA-C   250 mg at 02/04/13 2129  . venlafaxine XR (EFFEXOR-XR) 24 hr capsule 37.5 mg  37.5 mg Oral q morning - 10a Verne Spurr, PA-C   37.5 mg at 02/05/13 4540    Lab Results: No results found for this or any previous visit (from the past 48 hour(s)).  Physical Findings: AIMS: Facial and Oral Movements Muscles of Facial Expression: None, normal Lips and Perioral Area: None,  normal Jaw: None, normal Tongue: None, normal,Extremity Movements Upper (arms, wrists, hands, fingers): None, normal Lower (legs, knees, ankles, toes): None, normal, Trunk Movements Neck, shoulders, hips: None, normal, Overall Severity Severity of abnormal movements (highest score from questions above): None, normal Incapacitation due to abnormal movements: None, normal Patient's awareness of abnormal movements (rate only patient's report): No Awareness, Dental Status Current problems with teeth and/or dentures?: No Does patient usually wear dentures?: No  CIWA:  CIWA-Ar Total: 0 COWS:  COWS Total Score: 0  Treatment Plan Summary: Daily contact with patient to assess and evaluate symptoms and progress in treatment Medication management  Plan: 1. Continue crisis management and stabilization. 2. Medication management to reduce current symptoms to base line and improve patient's overall level of functioning 3. Treat health problems as indicated. 4. Develop treatment plan to decrease risk of relapse upon discharge and the need for readmission. 5. Psycho-social education regarding relapse prevention and self care. 6. Health care follow up as needed for medical problems. 7. Initiate Artane 5mg  po BID for EPS prevention. 8. Will continue Zyprexa to 15 mg  Twice daily. 9.Will continue prolixin 10 mg q AM and 10mg  at hs. 10. Will initiate Lithium carbonate 300mg  po BID as requested by her Husband who reported that it helped in the past. 11. Lithium level on Thursday.  Medical Decision Making Problem Points:  Established problem, worsening (2) and Review of psycho-social stressors (1) Data Points:  Review or order clinical lab tests (1) Review or order medicine tests (1) Review and summation of old records (2) Review of medication regiment & side effects (2)  I certify that inpatient services furnished can reasonably be expected to improve the patient's condition.    Thedore Mins,  MD 02/21/2013 11:12 AM

## 2013-02-21 NOTE — Clinical Social Work Note (Signed)
Spoke with husband who is frustrated with lack of progress of pt. When I suggested she might do better at home, he readily agreed to try it.  I told him that I would let the Dr know of his wish tomorrow, and would alert the ACT team to her potential d/c this week. He works 3 afternoons a week, but stated that he would drop her off at her mother's home so that she would be supervised at all times.

## 2013-02-21 NOTE — Progress Notes (Signed)
[facility] LCSW Aftercare Discharge Planning Group Note  02/21/2013 10:32 AM  Participation Quality:  Did not attend   Ida Rogue 02/21/2013, 10:32 AM

## 2013-02-21 NOTE — Progress Notes (Signed)
Patient ID: Charlotte [name], female   DOB: [birth year], 46 y.o.   MRN: [id number] Patient presents agitated and anxious.  She is reporting that she is pregnant and holding her belly.  I explained to her that she is not pregnant; that she was tested when she was admitted.  Patient had a visit from her husband and a friend who ate with her in her room.  Patient becomes agitated after her husband's visit.  She advised the tech that "he never listens to me."  She spoke of her unhappiness in the marriage.  Patient reported that she has no friends that she can talk to.  Patient was given ativan 1 mg for agitation and she is resting quietly.  Patient was more organized and clear this am; noted a big difference in mood and affect after visit.

## 2013-02-21 NOTE — Clinical Social Work Note (Signed)
[facility] LCSW Group Therapy  02/21/2013 , 1:42 PM   Type of Therapy:  Group Therapy  Participation Level:  Did not attend    Summary of Progress/Problems: Today's group focused on the term Diagnosis.  Participants were asked to define the term, and then pronounce whether it is a negative, positive or neutral term.  Charlotte [name] B 02/21/2013 , 1:42 PM

## 2013-02-22 NOTE — Progress Notes (Signed)
Patient ID: Charlotte [name], female   DOB: [birth year], 47 y.o.   MRN: [id number] D: Pt. Reports doing well, then says feel depressed that husband couldn't be admitted with her. Pt. Looks away during our conversation and starts talking as if to another. Pt. Preoccupied, but can be redirected. A: Staff will monitor 1:1 for safety. Pt. Encouraged to attend group. R: Pt. Is safe on the unit. Pt. Attended group.

## 2013-02-22 NOTE — Progress Notes (Signed)
Recreation Therapy Notes   Date: 03.05.2014 Time: 9:30am Location: [address] Day Room      Group Topic/Focus: Communication  Participation Level: Did not attend  [employer], LRT/CTRS  Blanch[place], Denise L 02/22/2013 10:20 AM

## 2013-02-22 NOTE — Progress Notes (Signed)
Observation note: pt in room engaged with sitter. Pt is pleasantly cooperative at this time. Pt has tangential speech and is intrusive at times. Pt is cooperative when redirected by staff.  Close observation maintained with sitter until d/c'd. Pt is allowed to leave the unit with sitter.

## 2013-02-22 NOTE — Progress Notes (Signed)
[facility] LCSW Aftercare Discharge Planning Group Note  02/22/2013 9:35 AM  Participation Quality:  Intrusive  Affect:  Blunted  Cognitive:  Disorganized  Insight:  None  Engagement in Group:  None  Modes of Intervention:  Discussion, Exploration and Socialization  Summary of Progress/Problems: Charlotte [name] is still on a 1:1.  She got up and went to sit by another patient and grabbed his hand to hold it.  She was redirected but would not move.  We had the other patient move.  She then got up and started going through the piano bench, so the 1:1 removed her from the group.  [name] B 02/22/2013, 9:35 AM

## 2013-02-22 NOTE — Progress Notes (Signed)
Patient in bed awake at the beginning of this shift. She reported that she was having hateful feelings and had no control over her feelings.  She appeared anxious and somewhat restless. Her thought process was disorganized and tangential and writer couldn't make sense out of what patient was trying to say. Writer offered patient Ativan 1 mg for anxiety. Patient received this medication without difficulty, Q 15 minute check continues as ordered to maintain safety.

## 2013-02-22 NOTE — Progress Notes (Signed)
Pt in room sitting on her bed with sitter at side. Pt denies pain and show no s/s of distress. Pt compliant with meds this am. Pt continues to need redirecting by staff. Pt is responding to internal stimuli. Pt at med window this morning repeatedly stating, "Where is [place], I want to save [place]". Pt is religiously preoccupied andcontinues to state that she wants to heal people.  Medications administered as ordered per MD. Verbal support given. 1:1 assessment completed. 1:1 observation maintained for safety until d/c'd. 15 minute checks performed for safety.  Pt is mildly confused.

## 2013-02-22 NOTE — Progress Notes (Signed)
Observation note: pt sitting on bed looking out the window. Pt is tearful and reported to Clinical research associate that she is thinking about an ex boyfriend. Pt reported that she want his spirit to be safe. Pt continues to respond to internal stimuli. religiously preoccupied.1:1 observation maintained for safety until d/c'd. 15 minute checks performed for safety. Pt remains safe.

## 2013-02-22 NOTE — Clinical Social Work Note (Signed)
[facility] [employer] Group Therapy  02/22/2013 , 2:27 PM    Type of Therapy:  [employer] Presentation  Participation Level:  Active  Participation Quality:  Attentive  Affect:  Blunted  Cognitive:  Oriented  Insight:  Limited  Engagement in Therapy:  Engaged  Modes of Intervention:  Discussion, Education and Socialization  Summary of Progress/Problems:  Charlotte [name] from [employer] came to present his recovery story and play the guitar.  Charlotte [name] was quiet throughout group until the very end, when she expressed loneliness and misses staying at home with her family very much.  At one point during group she got up to sit by speaker, and I warned her that if she invaded his personal space in any way, she would have to leave.  She kept her hands to herself.  Charlotte [name] 02/22/2013 , 2:27 PM

## 2013-02-22 NOTE — Progress Notes (Signed)
Patient ID: Charlotte [name], female   DOB: 04-Sep-1966, 47 y.o.   MRN: [id number] [facility] MD Progress Note  02/22/2013 1:40 PM Kurstin Dimarzo  MRN:  [id number]  Subjective: "I'm better today." Patient went on to say that she feels that she can control her behaviors better when her husband is around. She voiced no new complaints.  Objective:  Jahlisa is oriented x 3 today, speech is clear with no thought blocking. She is mildly anxious, reports her sleep is good, and her depression is mild. She states the voices are better. Diagnosis:  Schizoaffective disorder Bipolar type  ADL's:  Intact  Sleep: Fair  Appetite:  fair  Suicidal Ideation:  denies Homicidal Ideation:  denies AEB (as evidenced by): patient's response, affect, and participation in unit programming.  Psychiatric Specialty Exam: Review of Systems  Constitutional: Negative.  Negative for fever, chills, weight loss, malaise/fatigue and diaphoresis.  HENT: Negative for congestion and sore throat.   Eyes: Negative for blurred vision, double vision and photophobia.  Respiratory: Negative for cough, shortness of breath and wheezing.   Cardiovascular: Negative for chest pain, palpitations and PND.  Gastrointestinal: Negative for heartburn, nausea, vomiting, abdominal pain, diarrhea and constipation.  Musculoskeletal: Negative for myalgias, joint pain and falls.  Neurological: Positive for tremors (mild bilateral fine tremors to hands''). Negative for dizziness, tingling, sensory change, speech change, focal weakness, seizures, loss of consciousness, weakness and headaches.  Endo/Heme/Allergies: Negative for polydipsia. Does not bruise/bleed easily.  Psychiatric/Behavioral: Negative for depression, suicidal ideas, memory loss and substance abuse. Hallucinations: decreasing. The patient is nervous/anxious. The patient does not have insomnia.     Blood pressure 132/85, pulse 100, temperature 96.9 F (36.1 C), temperature source Oral,  resp. rate 20, height 5\' 5"  (1.651 m), weight 81.647 kg (180 lb).Body mass index is 29.95 kg/(m^2).  General Appearance: fairly groomed  Patent attorney::  Fair  Speech:  Slow but clear  Volume:  Normal  Mood:  Anxious and Depressed  Affect:  Flat  Thought Process:  Disorganized  Orientation:  NA  Thought Content:  Hallucinations: patient appears to be responding to internal stimuli  Suicidal Thoughts:  I don't know  Homicidal Thoughts:  I don't know  Memory:  NA  Judgement:  Impaired  Insight:  Lacking  Psychomotor Activity:  Psychomotor Retardation  Concentration:  Poor  Recall:  Poor  Akathisia:  No  Handed:  Right  AIMS (if indicated):     Assets:  Desire for Improvement  Sleep:  Number of Hours: 5.25   Current Medications: Current Facility-Administered Medications  Medication Dose Route Frequency Provider Last Rate Last Dose  . acetaminophen (TYLENOL) tablet 650 mg  650 mg Oral Q6H PRN Sanjuana Kava, NP      . alum & mag hydroxide-simeth (MAALOX/MYLANTA) 200-200-20 MG/5ML suspension 30 mL  30 mL Oral Q4H PRN Sanjuana Kava, NP      . benztropine (COGENTIN) tablet 1 mg  1 mg Oral BID Verne Spurr, PA-C   1 mg at 02/05/13 0802  . fluPHENAZine (PROLIXIN) tablet 10 mg  10 mg Oral QHS Verne Spurr, PA-C   10 mg at 02/04/13 2129  . hydrOXYzine (ATARAX/VISTARIL) tablet 25 mg  25 mg Oral Q4H PRN Sanjuana Kava, NP   25 mg at 02/04/13 1814  . magnesium hydroxide (MILK OF MAGNESIA) suspension 30 mL  30 mL Oral Daily PRN Sanjuana Kava, NP      . OLANZapine zydis (ZYPREXA) disintegrating tablet 30 mg  30 mg Oral QHS  Verne Spurr, PA-C      . valproic acid (DEPAKENE) 250 MG capsule 250 mg  250 mg Oral QHS Verne Spurr, PA-C   250 mg at 02/04/13 2129  . venlafaxine XR (EFFEXOR-XR) 24 hr capsule 37.5 mg  37.5 mg Oral q morning - 10a Verne Spurr, PA-C   37.5 mg at 02/05/13 1610    Lab Results: No results found for this or any previous visit (from the past 48 hour(s)).  Physical  Findings: AIMS: Facial and Oral Movements Muscles of Facial Expression: None, normal Lips and Perioral Area: None, normal Jaw: None, normal Tongue: None, normal,Extremity Movements Upper (arms, wrists, hands, fingers): None, normal Lower (legs, knees, ankles, toes): None, normal, Trunk Movements Neck, shoulders, hips: None, normal, Overall Severity Severity of abnormal movements (highest score from questions above): None, normal Incapacitation due to abnormal movements: None, normal Patient's awareness of abnormal movements (rate only patient's report): No Awareness, Dental Status Current problems with teeth and/or dentures?: No Does patient usually wear dentures?: No  CIWA:  CIWA-Ar Total: 0 COWS:  COWS Total Score: 0  Treatment Plan Summary: Daily contact with patient to assess and evaluate symptoms and progress in treatment Medication management  Plan: 1. Continue crisis management and stabilization. 2. Medication management to reduce current symptoms to base line and improve patient's overall level of functioning 3. Treat health problems as indicated. 4. Develop treatment plan to decrease risk of relapse upon discharge and the need for readmission. 5. Psycho-social education regarding relapse prevention and self care. 6. Health care follow up as needed for medical problems. 7. Initiate Artane 5mg  po BID for EPS prevention. 8. Will continue Zyprexa to 15 mg  Twice daily. 9.Will continue prolixin 10 mg q AM and 10mg  at hs. 10. Will continue Lithium carbonate 300mg  po BID as requested by her Husband who reported that it helped in the past. 11. Lithium level on Thursday. 12. Will plan for discharge home tomorrow per CM and MD. Medical Decision Making Problem Points:  Established problem, worsening (2) and Review of psycho-social stressors (1) Data Points:  Review or order clinical lab tests (1) Review or order medicine tests (1) Review and summation of old records (2) Review of  medication regiment & side effects (2)  I certify that inpatient services furnished can reasonably be expected to improve the patient's condition.   Rona Ravens. Jayelyn Barno RPAC 1:47 PM 02/22/2013  02/22/2013 1:40 PM

## 2013-02-23 MED ORDER — OLANZAPINE 15 MG PO TBDP: 15.0000 mg | ORAL_TABLET | Freq: Two times a day (BID) | ORAL | Status: DC

## 2013-02-23 MED ORDER — LITHIUM CARBONATE ER 300 MG PO TBCR: 300.0000 mg | EXTENDED_RELEASE_TABLET | Freq: Two times a day (BID) | ORAL | Status: DC

## 2013-02-23 MED ORDER — TRIHEXYPHENIDYL HCL 5 MG PO TABS: 5.0000 mg | ORAL_TABLET | Freq: Two times a day (BID) | ORAL | Status: DC

## 2013-02-23 MED ORDER — FLUPHENAZINE HCL 5 MG PO TABS: ORAL_TABLET | ORAL | Status: DC

## 2013-02-23 MED ORDER — FLUPHENAZINE HCL 5 MG PO TABS: 15.0000 mg | ORAL_TABLET | Freq: Two times a day (BID) | ORAL | Status: DC

## 2013-02-23 MED ORDER — VENLAFAXINE HCL ER 75 MG PO CP24: 75.0000 mg | ORAL_CAPSULE | Freq: Every day | ORAL | Status: DC

## 2013-02-23 NOTE — Clinical Social Work Note (Signed)
[facility] LCSW Group Therapy  02/23/2013  1:15 PM   Type of Therapy:  Group Therapy  Participation Level:  Did Not Attend group on [employer]  Chelsea Horton, LCSWA 02/23/2013 2:18 PM   

## 2013-02-23 NOTE — [facility] Suicide Risk Assessment (Signed)
Suicide Risk Assessment  Discharge Assessment     Demographic Factors:  Low socioeconomic status, Unemployed and female  Mental Status Per Nursing Assessment::   On Admission:  NA  Current Mental Status by Physician: patient denies suicidal ideation,intent or plan  Loss Factors: Decrease in vocational status  Historical Factors: NA  Risk Reduction Factors:   Living with another person, especially a relative, Positive social support and Positive therapeutic relationship  Continued Clinical Symptoms:  Resolving psychosis and delusions  Cognitive Features That Contribute To Risk:  Closed-mindedness Polarized thinking Thought constriction (tunnel vision)    Suicide Risk:  Minimal: No identifiable suicidal ideation.  Patients presenting with no risk factors but with morbid ruminations; may be classified as minimal risk based on the severity of the depressive symptoms  Discharge Diagnoses:   AXIS I:  Schizoaffective disorder, bipolar type  AXIS II:  Deferred AXIS III:   Past Medical History  Diagnosis Date  . Headache   . Low back pain    AXIS IV:  other psychosocial or environmental problems and problems related to social environment AXIS V:  61-70 mild symptoms  Plan Of Care/Follow-up recommendations:  Activity:  as tolerated Diet:  healthy Tests:  lithium level Other:  patient to keep her after care appointment with ACT team.  Is patient on multiple antipsychotic therapies at discharge:  No   Has Patient had three or more failed trials of antipsychotic monotherapy by history:  No  Recommended Plan for Multiple Antipsychotic Therapies: Additional reason(s) for multiple antispychotic treatment:  patient is currently on Zyprexa and Prolixin because her delusions and psychotic were not controlled on a single maximum dose of antipsychotic.  Thedore Mins, MD 02/23/2013, 10:05 AM

## 2013-02-23 NOTE — Progress Notes (Signed)
[facility] Group Notes:  (Nursing/MHT/Case Management/Adjunct)  Date:  02/23/2013  Time:  9:26 AM  Type of Therapy:  Discharge Planning  Participation Level:  Did Not Attend    Nail, Catalina Gravel 02/23/2013, 9:26 AM

## 2013-02-23 NOTE — Discharge Summary (Signed)
Physician Discharge Summary Note  Patient:  Charlotte [name] is an 47 y.o., female MRN:  [id number] DOB:  1966-08-24 Patient phone:  [phone] (home)  Patient address:   [address] [place] [address],   Date of Admission:  02/03/2013 Date of Discharge: 02/23/2013 Reason for Admission: psychosis  Discharge Diagnoses: Principal Problem:   Schizoaffective disorder, bipolar type Discharge Diagnoses:  AXIS I: Schizoaffective disorder, bipolar type  AXIS II: Deferred  AXIS III:  Past Medical History   Diagnosis  Date   .  Headache    .  Low back pain    AXIS IV: other psychosocial or environmental problems and problems related to social environment  AXIS V: 61-70 mild symptoms  Review of Systems  Constitutional: Negative.  Negative for fever, chills, weight loss, malaise/fatigue and diaphoresis.  HENT: Negative for congestion and sore throat.   Eyes: Negative for blurred vision, double vision and photophobia.  Respiratory: Negative for cough, shortness of breath and wheezing.   Cardiovascular: Negative for chest pain, palpitations and PND.  Gastrointestinal: Negative for heartburn, nausea, vomiting, abdominal pain, diarrhea and constipation.  Musculoskeletal: Negative for myalgias, joint pain and falls.  Neurological: Negative for dizziness, tingling, tremors, sensory change, speech change, focal weakness, seizures, loss of consciousness, weakness and headaches.  Endo/Heme/Allergies: Negative for polydipsia. Does not bruise/bleed easily.  Psychiatric/Behavioral: Negative for depression, suicidal ideas, hallucinations, memory loss and substance abuse. The patient is not nervous/anxious and does not have insomnia.     Level of Care:  OP  Hospital Course:  Charlotte [name] was brought to the ED by her husband for increasingly bizarre behaviors over the preceding several days prior to admission. She has a long history of mental illness with a diagnosis of Schizoaffective disorder and  paranoid schizophrenia. This is her 7th admission in the last 12 months.      The duration of stay was 20 days.      The patient was seen and evaluated by the Treatment team consisting of Psychiatrist, PAC, RN, Case Manager, and Therapist for evaluation and treatment plan with goal of stabilization upon discharge. The patient's physical and mental health problems were identified and treated appropriately.      Multiple modalities of treatment were used including medication, individual and group therapies, unit programming, improved nutrition, physical activity, and family sessions as needed.     The symptoms of psychosis with mood lability, depression and inappropriate behaviors were monitored daily by evaluation by clinical provider.  The patient's mental and emotional status was evaluated by a daily self inventory completed by the patient.  Charlotte [name] was on a 1:1 for most of her admission due to her wandering and inappropriate touching of other patients.      Improvement was demonstrated by improving vital signs, increased cognition, and improvement in mood, sleep, appetite as well as a reduction in physical symptoms.       The patient was evaluated and found to be stable enough for discharge and was released to her home with her husband with a plan to follow up with ACT team per the initial plan of treatment.   Mental Status Exam:  For mental status exam please see mental status exam and  suicide risk assessment completed by attending physician prior to discharge.  Consults:  None  Significant Diagnostic Studies:  None  Discharge Vitals:   Blood pressure 139/99, pulse 87, temperature 97.1 F (36.2 C), temperature source Oral, resp. rate 14, height 5\' 5"  (1.651 m), weight 81.647 kg (180 lb).  Body mass index is 29.95 kg/(m^2). Lab Results:   Results for orders placed during the hospital encounter of 02/03/13 (from the past 72 hour(s))  LITHIUM LEVEL     Status: Abnormal   Collection Time     02/23/13  6:21 AM      Result Value Range   Lithium Lvl 0.43 (*) 0.80 - 1.40 mEq/L    Physical Findings: AIMS: Facial and Oral Movements Muscles of Facial Expression: None, normal Lips and Perioral Area: None, normal Jaw: None, normal Tongue: None, normal,Extremity Movements Upper (arms, wrists, hands, fingers): None, normal Lower (legs, knees, ankles, toes): None, normal, Trunk Movements Neck, shoulders, hips: None, normal, Overall Severity Severity of abnormal movements (highest score from questions above): None, normal Incapacitation due to abnormal movements: None, normal Patient's awareness of abnormal movements (rate only patient's report): No Awareness, Dental Status Current problems with teeth and/or dentures?: No Does patient usually wear dentures?: No  CIWA:  CIWA-Ar Total: 0 COWS:  COWS Total Score: 0  Psychiatric Specialty Exam: See Psychiatric Specialty Exam and Suicide Risk Assessment completed by Attending Physician prior to discharge.  Discharge destination:  Home  Is patient on multiple antipsychotic therapies at discharge:  Yes. The patient was admitted from out patient clinic during the process of cross tapering 2 antipsychotics. Her former out patient psychiatrist recommended 2 antipsychotics due to the increased number of hospital admissions in the last 12 months and due to the severity of the patient's symptoms and level of dysfunction. Do you recommend tapering to monotherapy for antipsychotics?  No   Has Patient had three or more failed trials of antipsychotic monotherapy by history:  Yes,   Antipsychotic medications that previously failed include:   1.  Haldol., 2.  Risperdal. and 3.  Abilify.  Recommended Plan for Multiple Antipsychotic Therapies:  To be determined by the patient's new out patient provider based upon the patients response to treatment and level of stability.   Discharge Orders   Future Orders Complete By Expires     Diet - low sodium  heart healthy  As directed     Discharge instructions  As directed     Comments:      Take all your medications as prescribed by your mental healthcare provider. Report any adverse effects and or reactions from your medicines to your outpatient provider promptly. Patient is instructed and cautioned to not engage in alcohol and or illegal drug use while on prescription medicines. In the event of worsening symptoms, patient is instructed to call the crisis hotline, 911 and or go to the nearest ED for appropriate evaluation and treatment of symptoms. Follow-up with your primary care provider for your other medical issues, concerns and or health care needs.    Increase activity slowly  As directed         Medication List    STOP taking these medications       benztropine 1 MG tablet  Commonly known as:  COGENTIN     valproic acid 250 MG capsule  Commonly known as:  DEPAKENE      TAKE these medications     Indication   fluPHENAZine 5 MG tablet  Commonly known as:  PROLIXIN  Take 1 and 1/2 tablets (15mg ) by mouth twice a day, for psychosis.   Indication:  Psychosis     lithium carbonate 300 MG CR tablet  Commonly known as:  LITHOBID  Take 1 tablet (300 mg total) by mouth every 12 (twelve) hours. For mood  stabilization.   Indication:  Schizoaffective Disorder     olanzapine zydis 15 MG disintegrating tablet  Commonly known as:  ZYPREXA  Take 1 tablet (15 mg total) by mouth 2 (two) times daily. For psychosis and mental clarity.   Indication:  Schizophrenia     trihexyphenidyl 5 MG tablet  Commonly known as:  ARTANE  Take 1 tablet (5 mg total) by mouth 2 (two) times daily with a meal. For prevention of side effects.   Indication:  Extrapyramidal Reaction caused by Medications     venlafaxine XR 75 MG 24 hr capsule  Commonly known as:  EFFEXOR-XR  Take 1 capsule (75 mg total) by mouth daily. Each morning for depression and anxiety.   Indication:  Major Depressive Disorder            Follow-up Information   Follow up with [facility] ACT Team On 02/24/2013. (Your appointment is at 11:00 at the office.  After this intial appointment, all your meetings will be in the home.)    Contact information:   [address]  [address]  [place]  [phone]      Follow-up recommendations:  Activities: Resume activity as tolerated. Diet: Heart healthy low sodium diet Tests: Follow up testing will be determined by your out patient provider.  Comments:    Total Discharge Time:  Greater than 30 minutes.  Signed: Rona Ravens. Tanyah Debruyne RPAC 3:40 PM 02/25/2013

## 2013-02-23 NOTE — Progress Notes (Signed)
1:1 Note  Patient currently in hallway with MHT present; patient states that she is feeling "very happy" and that she "can't wait" to be discharged and "be with her husband"

## 2013-02-23 NOTE — Progress Notes (Signed)
Pt discharged per MD orders; pt currently denies SI/HI and auditory/visual hallucinations; pt did request that her husband be present for discharge instructions; husband and patient  were given education regarding medications and appointments; patient and husband then denied any concerns or questions after RN education; pt was then discharged to hospital lobby with husband

## 2013-02-25 ENCOUNTER — Emergency Department (HOSPITAL_COMMUNITY)
Admission: EM | Admit: 2013-02-25 | Discharge: 2013-02-26 | Disposition: A | Discharge status: Home / Self Care | Payer: Medicare Other | Attending: Emergency Medicine | Admitting: Emergency Medicine

## 2013-02-25 ENCOUNTER — Encounter (HOSPITAL_COMMUNITY): Payer: Self-pay

## 2013-02-25 DIAGNOSIS — Z87891 Personal history of nicotine dependence: Secondary | ICD-10-CM | POA: Insufficient documentation

## 2013-02-25 DIAGNOSIS — F209 Schizophrenia, unspecified: Secondary | ICD-10-CM | POA: Insufficient documentation

## 2013-02-25 DIAGNOSIS — F259 Schizoaffective disorder, unspecified: Secondary | ICD-10-CM

## 2013-02-25 DIAGNOSIS — Z79899 Other long term (current) drug therapy: Secondary | ICD-10-CM | POA: Insufficient documentation

## 2013-02-25 DIAGNOSIS — F411 Generalized anxiety disorder: Secondary | ICD-10-CM | POA: Insufficient documentation

## 2013-02-25 LAB — COMPREHENSIVE METABOLIC PANEL
Albumin: 4.5 g/dL (ref 3.5–5.2)
Alkaline Phosphatase: 64 U/L (ref 39–117)
BUN: 13 mg/dL (ref 6–23)
Potassium: 4 mEq/L (ref 3.5–5.1)
Sodium: 140 mEq/L (ref 135–145)
Total Protein: 7.8 g/dL (ref 6.0–8.3)

## 2013-02-25 LAB — URINALYSIS, ROUTINE W REFLEX MICROSCOPIC
Bilirubin Urine: NEGATIVE
Glucose, UA: NEGATIVE mg/dL
Hgb urine dipstick: NEGATIVE
Ketones, ur: NEGATIVE mg/dL
pH: 6.5 (ref 5.0–8.0)

## 2013-02-25 LAB — CBC WITH DIFFERENTIAL/PLATELET
Basophils Absolute: 0.1 10*3/uL (ref 0.0–0.1)
Eosinophils Relative: 0 % (ref 0–5)
HCT: 41.6 % (ref 36.0–46.0)
Hemoglobin: 13.6 g/dL (ref 12.0–15.0)
Lymphocytes Relative: 13 % (ref 12–46)
Lymphs Abs: 1.3 10*3/uL (ref 0.7–4.0)
MCV: 86.7 fL (ref 78.0–100.0)
Monocytes Absolute: 0.5 10*3/uL (ref 0.1–1.0)
Monocytes Relative: 5 % (ref 3–12)
RDW: 13.8 % (ref 11.5–15.5)
WBC: 9.7 10*3/uL (ref 4.0–10.5)

## 2013-02-25 LAB — RAPID URINE DRUG SCREEN, HOSP PERFORMED
Amphetamines: NOT DETECTED
Benzodiazepines: NOT DETECTED
Cocaine: NOT DETECTED
Opiates: NOT DETECTED
Tetrahydrocannabinol: NOT DETECTED

## 2013-02-25 LAB — URINE MICROSCOPIC-ADD ON

## 2013-02-25 LAB — ACETAMINOPHEN LEVEL: Acetaminophen (Tylenol), Serum: <15 ug/mL (ref 10–30)

## 2013-02-25 MED ORDER — LORAZEPAM 1 MG PO TABS
1.0000 mg | ORAL_TABLET | Freq: Once | ORAL | Status: AC
  Administered 2013-02-25: 1 mg via ORAL

## 2013-02-25 MED ORDER — LORAZEPAM 1 MG PO TABS
1.0000 mg | ORAL_TABLET | Freq: Three times a day (TID) | ORAL | Status: DC | PRN
  Administered 2013-02-25 – 2013-02-26 (×2): 1 mg via ORAL
  Filled 2013-02-25 (×3): qty 1

## 2013-02-25 NOTE — ED Notes (Signed)
Patient had one personal belonging bag I put it in locker 41

## 2013-02-25 NOTE — ED Notes (Signed)
Per [employer], Pt was IVC'ed by husband.  Husband sts "yesterday, Pt became unresponsive to him and their friends and she is talking in a manner that does not make sense.  She is talking about her children and she does not have children.  Also, yesterday, she was eating out of a dog bowl."  Hx of schizophrenia and bipolar disorder.

## 2013-02-25 NOTE — ED Notes (Signed)
[id number]<AV> Expected date:02/25/13<BR> Expected time: 9:15 AM<BR> Means of arrival:<BR> Comments:<BR> Med Clearance

## 2013-02-25 NOTE — ED Notes (Signed)
Charlotte [name], ACTTeam at bedside

## 2013-02-25 NOTE — ED Notes (Signed)
Pt laying in bed asleep.  Per previous day shift RN, pt was very agitated and medicated for agitaion.  Will continue to monitor

## 2013-02-25 NOTE — BH Assessment (Signed)
Assessment Note   Charlotte [name] is an 47 y.o. female. Pt presents to [facility] under IVC taken out by her husband. Per IVC ".respondent is schizoaffective and bipolar. Yesterday, pt went to a friend's house and dropped to her knees and became unresponsive. Peitioner stated that she has been talking in a manner that is confusing to him, i.e. asking about their children and they have none. Also, she went into the kitchen to get milk and poured milk into dog's bowl and started drinking out of it as if she were a dog."  Pt is drowsy and confused during assessment. She is oriented to person and date but thinks she is at [facility] and she says she is "unsure" whether she has any children. Pt denies HI and SI. She denies [facility]. Pt was last at [facility] from 02/03/13 to 02/23/13 for schizoaffective disorder, bipolar type.  Pt is poor historian. Some of her statements are nonsensical. During assessment, pt suddenly says, "Hey, I remember being here for the same thing." She says she drank out of her dog's bowl in order to for her dog Lawanna Kobus to not be scared as Angel's tag kept hitting bowl and scaring Angel. Pt says she has hardly had any sleep lately and no appetite. Pt doesn't answer questions asked. She makes several statements re: trying to have good boundaries and that her dog is often sad.   Axis I: Schizoaffective Disorder Axis II: Deferred Axis III:  Past Medical History  Diagnosis Date  . Headache   . Paranoid schizophrenia   . Depression   . Schizophrenia   . Low back pain    Axis IV: other psychosocial or environmental problems, problems related to social environment and problems with primary support group Axis V: 21-30 behavior considerably influenced by delusions or hallucinations OR serious impairment in judgment, communication OR inability to function in almost all areas  Past Medical History:  Past Medical History  Diagnosis Date  . Headache   . Paranoid schizophrenia   . Depression   .  Schizophrenia   . Low back pain     History reviewed. No pertinent past surgical history.  Family History:  Family History  Problem Relation Age of Onset  . Diabetes    . Hypertension    . Breast cancer      Social History:  reports that she has quit smoking. She does not have any smokeless tobacco history on file. She reports that she does not drink alcohol or use illicit drugs.  Additional Social History:  Alcohol / Drug Use Pain Medications: see PTA meds list - pt denies SA Prescriptions: see PTA meds list -  pt denies SA Over the Counter: see PTA meds  list - pt denies SA History of alcohol / drug use?: No history of alcohol / drug abuse Longest period of sobriety (when/how long): N/A  CIWA: CIWA-Ar BP: 125/86 mmHg Pulse Rate: 96 COWS:    Allergies:  Allergies  Allergen Reactions  . Divalproex Sodium     Causing tremors  . Risperidone Itching    Causes altered mental status  . Sulfonamide Derivatives     REACTION: Eye irritation    Home Medications:  (Not in a hospital admission)  OB/GYN Status:  No LMP recorded. Patient is not currently having periods (Reason: Other).  General Assessment Data Location of Assessment: [facility] ED Living Arrangements: Spouse/significant other Can pt return to current living arrangement?: Yes Admission Status: Involuntary Is patient capable of signing voluntary admission?:  No Transfer from: Acute Hospital Referral Source: Self/Family/Friend  Education Status Is patient currently in school?: No Current Grade: na Highest grade of school patient has completed: 13.5 Name of school: [employer]  Risk to self Suicidal Ideation: No Suicidal Intent: No Is patient at risk for suicide?: No Suicidal Plan?: No Access to Means: No What has been your use of drugs/alcohol within the last 12 months?: none Previous Attempts/Gestures: Yes How many times?: 14 Other Self Harm Risks: unable to assess Triggers for Past Attempts:  Unpredictable Intentional Self Injurious Behavior: None Family Suicide History: No Recent stressful life event(s):  (unable to assess) Persecutory voices/beliefs?: No Depression: Yes Depression Symptoms: Insomnia (loss of appetite) Substance abuse history and/or treatment for substance abuse?: No Suicide prevention information given to non-admitted patients: Not applicable  Risk to Others Homicidal Ideation: No Thoughts of Harm to Others: No Current Homicidal Intent: No Current Homicidal Plan: No Access to Homicidal Means:  (unable to assess) Identified Victim: none History of harm to others?: No Assessment of Violence: In past 6-12 months Violent Behavior Description: none Does patient have access to weapons?: No Criminal Charges Pending?:  (unable to assess) Does patient have a court date:  (unable to assess)  Psychosis Hallucinations: None noted Delusions: None noted  Mental Status Report Appear/Hygiene: Disheveled Eye Contact: Poor (lying on back looking up at ceiling) Motor Activity: Freedom of movement Speech: Incoherent;Tangential Level of Consciousness: Drowsy;Sleeping Mood:  (unable to assess) Affect:  (confused) Anxiety Level: None Thought Processes: Tangential;Relevant;Irrelevant Judgement: Impaired Orientation: Person;Time Obsessive Compulsive Thoughts/Behaviors: None  Cognitive Functioning Concentration:  (unable to assess) Memory:  (unable to assess) IQ: Average Insight: Poor Impulse Control: Poor Appetite: Poor Sleep: Decreased Total Hours of Sleep:  (pt doesn't give #of hrs) Vegetative Symptoms:  (unable to assess)  ADLScreening [facility] Assessment Services) Patient's cognitive ability adequate to safely complete daily activities?: Yes Patient able to express need for assistance with ADLs?: Yes Independently performs ADLs?: Yes (appropriate for developmental age)  Abuse/Neglect [facility]) Physical Abuse: Denies Verbal Abuse: Denies Sexual Abuse:  Denies  Prior Inpatient Therapy Prior Inpatient Therapy: Yes Prior Therapy Dates: 2002,2005,2014 Prior Therapy Facilty/Provider(s): [facility], [facility] Reason for Treatment: psychosis  Prior Outpatient Therapy Prior Outpatient Therapy: Yes Prior Therapy Dates: Current  Prior Therapy Facilty/Provider(s): Dr. Kathryne Sharper  Reason for Treatment: Psychosis/SI   ADL Screening (condition at time of admission) Patient's cognitive ability adequate to safely complete daily activities?: Yes Patient able to express need for assistance with ADLs?: Yes Independently performs ADLs?: Yes (appropriate for developmental age) Weakness of Legs: None Weakness of Arms/Hands: None       Abuse/Neglect Assessment (Assessment to be complete while patient is alone) Physical Abuse: Denies Verbal Abuse: Denies Sexual Abuse: Denies Exploitation of patient/patient's resources: Denies Values / Beliefs Cultural Requests During Hospitalization: None Spiritual Requests During Hospitalization: None   Advance Directives (For Healthcare) Advance Directive: Patient does not have advance directive;Patient would not like information    Additional Information 1:1 In Past 12 Months?: No CIRT Risk: No Elopement Risk: No Does patient have medical clearance?: Yes     Disposition:  Disposition Initial Assessment Completed: Yes Disposition of Patient: Inpatient treatment program Type of inpatient treatment program: Adult Type of outpatient treatment: Adult  On Site Evaluation by:   Reviewed with Physician:     Donnamarie Rossetti P 02/25/2013 9:36 PM

## 2013-02-25 NOTE — ED Notes (Signed)
Pt seems to be very confused and apologetic.  Sts she forgot to take medication this morning, but has been taking it regularly.  Pt needs to be reoriented and redirected often.

## 2013-02-25 NOTE — ED Provider Notes (Signed)
History     CSN: [id number]  Arrival date & time 02/25/13  4696   First MD Initiated Contact with Patient 02/25/13 0935      Chief Complaint  Patient presents with  . Medical Clearance    (Consider location/radiation/quality/duration/timing/severity/associated sxs/prior treatment) HPI Pt IVC'd by husband for bizarre behavior. States pt was drinking milk from dog bowl and confused more than normal. Pt with poor insight and is unable to say why she is in the ED. She denies hallucinations, SI or HI.  Past Medical History  Diagnosis Date  . Headache   . Paranoid schizophrenia   . Depression   . Schizophrenia   . Low back pain     History reviewed. No pertinent past surgical history.  Family History  Problem Relation Age of Onset  . Diabetes    . Hypertension    . Breast cancer      History  Substance Use Topics  . Smoking status: Former Smoker -- 0.01 packs/day for 5 years  . Smokeless tobacco: Not on file  . Alcohol Use: No    OB History   Grav Para Term Preterm Abortions TAB SAB Ect Mult Living                  Review of Systems  Respiratory: Negative for shortness of breath.   Cardiovascular: Negative for chest pain.  Gastrointestinal: Negative for nausea, vomiting and abdominal pain.  Skin: Negative for rash and wound.  Neurological: Negative for weakness, numbness and headaches.  Psychiatric/Behavioral: Positive for behavioral problems and confusion. Negative for suicidal ideas and hallucinations.    Allergies  Divalproex sodium; Risperidone; and Sulfonamide derivatives  Home Medications   Current Outpatient Rx  Name  Route  Sig  Dispense  Refill  . fluPHENAZine (PROLIXIN) 5 MG tablet      Take 1 and 1/2 tablets (15mg ) by mouth twice a day, for psychosis.   90 tablet   0   . lithium carbonate (LITHOBID) 300 MG CR tablet   Oral   Take 1 tablet (300 mg total) by mouth every 12 (twelve) hours. For mood stabilization.   60 tablet   0   .  OLANZapine zydis (ZYPREXA) 15 MG disintegrating tablet   Oral   Take 1 tablet (15 mg total) by mouth 2 (two) times daily. For psychosis and mental clarity.   60 tablet   0   . trihexyphenidyl (ARTANE) 5 MG tablet   Oral   Take 1 tablet (5 mg total) by mouth 2 (two) times daily with a meal. For prevention of side effects.   60 tablet   0   . venlafaxine XR (EFFEXOR-XR) 75 MG 24 hr capsule   Oral   Take 1 capsule (75 mg total) by mouth daily. Each morning for depression and anxiety.   30 capsule   0     BP 126/87  Pulse 92  Temp(Src) 97.4 F (36.3 C) (Oral)  Resp 18  SpO2 99%  Physical Exam  Nursing note and vitals reviewed. Constitutional: She appears well-developed and well-nourished. No distress.  HENT:  Head: Normocephalic and atraumatic.  Eyes: EOM are normal. Pupils are equal, round, and reactive to light.  Neck: Normal range of motion. Neck supple.  Cardiovascular: Normal rate and regular rhythm.   Pulmonary/Chest: Effort normal and breath sounds normal. No respiratory distress. She has no wheezes. She has no rales.  Abdominal: Soft. Bowel sounds are normal. She exhibits no mass. There is no tenderness.  There is no rebound and no guarding.  Musculoskeletal: Normal range of motion. She exhibits no edema and no tenderness.  Neurological: She is alert.  5/5 motor in all ext, sensation intact  Skin: Skin is warm and dry. No rash noted. No erythema.  Psychiatric:  Poor insight. No SI    ED Course  Procedures (including critical care time)  Labs Reviewed  COMPREHENSIVE METABOLIC PANEL - Abnormal; Notable for the following:    Glucose, Bld 104 (*)    GFR calc non Af Amer 71 (*)    GFR calc Af Amer 82 (*)    All other components within normal limits  CBC WITH DIFFERENTIAL - Abnormal; Notable for the following:    Neutrophils Relative 81 (*)    Neutro Abs 7.9 (*)    All other components within normal limits  URINALYSIS, ROUTINE W REFLEX MICROSCOPIC - Abnormal;  Notable for the following:    Leukocytes, UA SMALL (*)    All other components within normal limits  SALICYLATE LEVEL - Abnormal; Notable for the following:    Salicylate Lvl <2.0 (*)    All other components within normal limits  PREGNANCY, URINE  ACETAMINOPHEN LEVEL  URINE RAPID DRUG SCREEN (HOSP PERFORMED)  ETHANOL  URINE MICROSCOPIC-ADD ON   No results found.   No diagnosis found.    MDM          Loren Racer, MD 02/25/13 [phone]

## 2013-02-26 MED ORDER — IBUPROFEN 600 MG PO TABS: 600.0000 mg | ORAL_TABLET | Freq: Three times a day (TID) | ORAL | Status: DC | PRN

## 2013-02-26 MED ORDER — FLUPHENAZINE HCL 5 MG PO TABS
7.5000 mg | ORAL_TABLET | Freq: Two times a day (BID) | ORAL | Status: DC
  Administered 2013-02-26: 7.5 mg via ORAL
  Filled 2013-02-26 (×2): qty 1

## 2013-02-26 MED ORDER — ZIPRASIDONE HCL 20 MG PO CAPS: 20.0000 mg | ORAL_CAPSULE | Freq: Two times a day (BID) | ORAL | Status: DC | PRN

## 2013-02-26 MED ORDER — TRIHEXYPHENIDYL HCL 5 MG PO TABS
5.0000 mg | ORAL_TABLET | Freq: Two times a day (BID) | ORAL | Status: DC
  Administered 2013-02-26 (×2): 5 mg via ORAL
  Filled 2013-02-26 (×3): qty 1

## 2013-02-26 MED ORDER — ONDANSETRON HCL 4 MG PO TABS: 4.0000 mg | ORAL_TABLET | Freq: Three times a day (TID) | ORAL | Status: DC | PRN

## 2013-02-26 MED ORDER — OLANZAPINE 5 MG PO TBDP
15.0000 mg | ORAL_TABLET | Freq: Two times a day (BID) | ORAL | Status: DC
  Administered 2013-02-26: 15 mg via ORAL
  Filled 2013-02-26: qty 1

## 2013-02-26 MED ORDER — BENZTROPINE MESYLATE 1 MG PO TABS
1.0000 mg | ORAL_TABLET | Freq: Two times a day (BID) | ORAL | Status: DC
  Administered 2013-02-26: 1 mg via ORAL
  Filled 2013-02-26: qty 1

## 2013-02-26 MED ORDER — ACETAMINOPHEN 325 MG PO TABS: 650.0000 mg | ORAL_TABLET | ORAL | Status: DC | PRN

## 2013-02-26 MED ORDER — VALPROIC ACID 250 MG PO CAPS
250.0000 mg | ORAL_CAPSULE | Freq: Every day | ORAL | Status: DC
  Administered 2013-02-26: 250 mg via ORAL
  Filled 2013-02-26: qty 1

## 2013-02-26 MED ORDER — ZOLPIDEM TARTRATE 5 MG PO TABS: 5.0000 mg | ORAL_TABLET | Freq: Every evening | ORAL | Status: DC | PRN

## 2013-02-26 MED ORDER — LITHIUM CARBONATE ER 300 MG PO TBCR
300.0000 mg | EXTENDED_RELEASE_TABLET | Freq: Two times a day (BID) | ORAL | Status: DC
  Administered 2013-02-26: 300 mg via ORAL
  Filled 2013-02-26 (×2): qty 1

## 2013-02-26 MED ORDER — VENLAFAXINE HCL ER 75 MG PO CP24
75.0000 mg | ORAL_CAPSULE | Freq: Every day | ORAL | Status: DC
  Administered 2013-02-26: 75 mg via ORAL
  Filled 2013-02-26: qty 1

## 2013-02-26 NOTE — ED Provider Notes (Signed)
Psychiatry consult complete. She is not imminent threat to herself or others. She is not grossly psychotic. Per IVC has been revoked. Patient denies SI or HI. She denies hearing voices. She is eating and has no complaints. She stable for discharge.  BP 129/82  Pulse 96  Temp(Src) 97.6 F (36.4 C) (Oral)  Resp 18  SpO2 100%   Glynn Octave, MD 02/26/13 1849

## 2013-02-26 NOTE — ED Provider Notes (Signed)
8:50 AM Home meds written for. telepsych consultation this AM. No complaints this AM  Filed Vitals:   02/26/13 0600  BP: 131/91  Pulse: 87  Temp: 97.7 F (36.5 C)  Resp: 20     Lyanne Co, MD 02/26/13 [phone]

## 2013-02-26 NOTE — BHH Counselor (Signed)
Referral faxed to [facility]. Ale[facility] says [facility] has a few beds available.  Caroline Paige McLean, LCSWA Assessment Counselor   

## 2013-02-26 NOTE — BHH Counselor (Addendum)
Telepsych recommends d/c with outpatient referrals. Pt currently denies HI and SI. She talks for a little while about creating a pair of safety scissors which aren't sharp to use to cut out things from mags when she is home. Pt denies [facility]. Writer gave her list of ACTT services in area and strongly urged her to call an agency tomorrow. Husband arrived. Writer answered his questions. Husband informed Clinical research associate that pt was to start services with [facility]. Writer provided phone # to [facility] and urged husband to call in am. Pt then asked writer if Clinical research associate could teach them about self respect. Writer told pt that was an important thing to learn and to make sure she asked her [facility] one on one worker about various social skills. Pt to be d/c now.  Evette Cristal, [place] Assessment Counselor

## 2013-02-26 NOTE — BHH Counselor (Signed)
Patient has been accepted at [facility] by Dr. Ferol Luz pending bed availability.

## 2013-02-27 NOTE — Discharge Summary (Signed)
Seen and agreed. Mojeed Akintayo, MD 

## 2013-02-28 NOTE — Progress Notes (Signed)
Patient Discharge Instructions:  After Visit Summary (AVS):   Faxed to:  02/28/13 Discharge Summary Note:   Faxed to:  02/28/13 Psychiatric Admission Assessment Note:   Faxed to:  02/28/13 Suicide Risk Assessment - Discharge Assessment:   Faxed to:  02/28/13 Faxed/Sent to the Next Level Care provider:  02/28/13 Faxed to [facility] @ [phone]  Jerelene Redden, 02/28/2013, 4:15 PM

## 2013-03-03 ENCOUNTER — Emergency Department (HOSPITAL_COMMUNITY)
Admission: EM | Admit: 2013-03-03 | Discharge: 2013-03-07 | Disposition: A | Discharge status: Other Acute Inpatient Hospital | Payer: Medicare Other | Attending: Emergency Medicine | Admitting: Emergency Medicine

## 2013-03-03 ENCOUNTER — Encounter (HOSPITAL_COMMUNITY): Payer: Self-pay | Admitting: Emergency Medicine

## 2013-03-03 DIAGNOSIS — F200 Paranoid schizophrenia: Secondary | ICD-10-CM | POA: Insufficient documentation

## 2013-03-03 DIAGNOSIS — Z79899 Other long term (current) drug therapy: Secondary | ICD-10-CM | POA: Insufficient documentation

## 2013-03-03 DIAGNOSIS — Z3202 Encounter for pregnancy test, result negative: Secondary | ICD-10-CM | POA: Insufficient documentation

## 2013-03-03 DIAGNOSIS — F29 Unspecified psychosis not due to a substance or known physiological condition: Secondary | ICD-10-CM | POA: Insufficient documentation

## 2013-03-03 DIAGNOSIS — Z87891 Personal history of nicotine dependence: Secondary | ICD-10-CM | POA: Insufficient documentation

## 2013-03-03 DIAGNOSIS — F3289 Other specified depressive episodes: Secondary | ICD-10-CM | POA: Insufficient documentation

## 2013-03-03 DIAGNOSIS — F329 Major depressive disorder, single episode, unspecified: Secondary | ICD-10-CM | POA: Insufficient documentation

## 2013-03-03 LAB — CBC WITH DIFFERENTIAL/PLATELET
Basophils Absolute: 0.1 10*3/uL (ref 0.0–0.1)
Eosinophils Relative: 2 % (ref 0–5)
Monocytes Absolute: 0.5 10*3/uL (ref 0.1–1.0)
Monocytes Relative: 7 % (ref 3–12)
Neutrophils Relative %: 68 % (ref 43–77)
Platelets: 296 10*3/uL (ref 150–400)
RBC: 4.53 MIL/uL (ref 3.87–5.11)
WBC: 7.1 10*3/uL (ref 4.0–10.5)

## 2013-03-03 LAB — COMPREHENSIVE METABOLIC PANEL
ALT: 12 U/L (ref 0–35)
AST: 16 U/L (ref 0–37)
Albumin: 4 g/dL (ref 3.5–5.2)
CO2: 28 mEq/L (ref 19–32)
Calcium: 9.3 mg/dL (ref 8.4–10.5)
GFR calc non Af Amer: 62 mL/min — ABNORMAL LOW (ref >90)
Sodium: 139 mEq/L (ref 135–145)

## 2013-03-03 LAB — ETHANOL: Alcohol, Ethyl (B): <11 mg/dL (ref 0–11)

## 2013-03-03 MED ORDER — TRIHEXYPHENIDYL HCL 5 MG PO TABS
5.0000 mg | ORAL_TABLET | Freq: Two times a day (BID) | ORAL | Status: DC
  Administered 2013-03-03 – 2013-03-04 (×3): 5 mg via ORAL
  Filled 2013-03-03 (×4): qty 1

## 2013-03-03 MED ORDER — NICOTINE 21 MG/24HR TD PT24
21.0000 mg | MEDICATED_PATCH | Freq: Every day | TRANSDERMAL | Status: DC
  Administered 2013-03-03: 21 mg via TRANSDERMAL
  Filled 2013-03-03: qty 1

## 2013-03-03 MED ORDER — ZOLPIDEM TARTRATE 5 MG PO TABS
5.0000 mg | ORAL_TABLET | Freq: Every evening | ORAL | Status: DC | PRN
  Administered 2013-03-03 – 2013-03-06 (×4): 5 mg via ORAL
  Filled 2013-03-03 (×4): qty 1

## 2013-03-03 MED ORDER — VENLAFAXINE HCL ER 75 MG PO CP24
75.0000 mg | ORAL_CAPSULE | Freq: Every day | ORAL | Status: DC
  Administered 2013-03-03 – 2013-03-07 (×5): 75 mg via ORAL
  Filled 2013-03-03 (×5): qty 1

## 2013-03-03 MED ORDER — LITHIUM CARBONATE ER 300 MG PO TBCR
300.0000 mg | EXTENDED_RELEASE_TABLET | Freq: Two times a day (BID) | ORAL | Status: DC
Start: 2013-03-03 — End: 2013-03-07
  Administered 2013-03-03 – 2013-03-07 (×8): 300 mg via ORAL
  Filled 2013-03-03 (×10): qty 1

## 2013-03-03 MED ORDER — ALUM & MAG HYDROXIDE-SIMETH 200-200-20 MG/5ML PO SUSP: 30.0000 mL | ORAL | Status: DC | PRN

## 2013-03-03 MED ORDER — ONDANSETRON HCL 4 MG PO TABS: 4.0000 mg | ORAL_TABLET | Freq: Three times a day (TID) | ORAL | Status: DC | PRN

## 2013-03-03 MED ORDER — ACETAMINOPHEN 325 MG PO TABS: 650.0000 mg | ORAL_TABLET | ORAL | Status: DC | PRN

## 2013-03-03 MED ORDER — FLUPHENAZINE HCL 5 MG PO TABS
15.0000 mg | ORAL_TABLET | Freq: Two times a day (BID) | ORAL | Status: DC
  Administered 2013-03-03 – 2013-03-04 (×2): 15 mg via ORAL
  Filled 2013-03-03 (×3): qty 1

## 2013-03-03 MED ORDER — IBUPROFEN 600 MG PO TABS: 600.0000 mg | ORAL_TABLET | Freq: Three times a day (TID) | ORAL | Status: DC | PRN

## 2013-03-03 MED ORDER — OLANZAPINE 5 MG PO TBDP
15.0000 mg | ORAL_TABLET | Freq: Two times a day (BID) | ORAL | Status: DC
  Administered 2013-03-03 – 2013-03-07 (×9): 15 mg via ORAL
  Filled 2013-03-03 (×9): qty 1

## 2013-03-03 MED ORDER — LORAZEPAM 1 MG PO TABS
1.0000 mg | ORAL_TABLET | Freq: Three times a day (TID) | ORAL | Status: DC | PRN
  Administered 2013-03-03 – 2013-03-06 (×3): 1 mg via ORAL
  Filled 2013-03-03 (×4): qty 1

## 2013-03-03 NOTE — ED Notes (Signed)
Pt brought to the Psych ED. Demonstrating manic behaviors of pressured speech, religiously preoccupied, flight of ideas, intrusive, unable to stay in her room. Requiring redirection frequently. Given the 15mg  of Zyprexa upon arrival.

## 2013-03-03 NOTE — ED Notes (Signed)
Pt presenting to ed with c/o medical clearance pt is manic saying things that don't make any sense. Pt's husband states that she is not her normal self. Pt states theirs some religious thing going on in the hallway.
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
## 2013-03-03 NOTE — ED Provider Notes (Signed)
Medical screening examination/treatment/procedure(s) were performed by non-physician practitioner and as supervising physician I was immediately available for consultation/collaboration.   David H Yao, MD 03/03/13 1541 
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
## 2013-03-03 NOTE — ED Notes (Signed)
Pt husband took pt belongings to car to take home

## 2013-03-03 NOTE — ED Provider Notes (Signed)
History     CSN: [id number]  Arrival date & time 03/03/13  1207   First MD Initiated Contact with Patient 03/03/13 1216      Chief Complaint  Patient presents with  . Medical Clearance    (Consider location/radiation/quality/duration/timing/severity/associated sxs/prior treatment) HPI  47 year old female with a significant past medical history of paranoid schizophrenia, depression, and psychosis accompanied by husband to ER for psychiatric evaluation. Patient accompanied with a  Note from today, from psychiatrist, Dr. Norvel Richards ([facility] [phone]) requesting for voluntary psychiatric admission.  Per note, Pt is diagnosed with schizoaffected disorder and is currently on multiple psych meds.  She is currently not successfully manage at home.  She is wandering around the neighborhood, is argumentative, has decreased sleep, and decrease intake.  She is getting rid of family possesions, having inappropriate sexual activities, and hyperreligiousity, with increase confusion.    History was obtained through husband, who agrees with the above assessment.  History was limited as pt has tangential thought pattern, and a poor historian.  Pt otherwise denies any active pain at this time.    Past Medical History  Diagnosis Date  . Headache   . Paranoid schizophrenia   . Depression   . Schizophrenia   . Low back pain     No past surgical history on file.  Family History  Problem Relation Age of Onset  . Diabetes    . Hypertension    . Breast cancer      History  Substance Use Topics  . Smoking status: Former Smoker -- 0.01 packs/day for 5 years  . Smokeless tobacco: Not on file  . Alcohol Use: No    OB History   Grav Para Term Preterm Abortions TAB SAB Ect Mult Living                  Review of Systems  Unable to perform ROS: Psychiatric disorder    Allergies  Divalproex sodium; Risperidone; and Sulfonamide derivatives  Home Medications   Current  Outpatient Rx  Name  Route  Sig  Dispense  Refill  . fluPHENAZine (PROLIXIN) 5 MG tablet   Oral   Take 15 mg by mouth 2 (two) times daily.         Marland Kitchen lithium carbonate (LITHOBID) 300 MG CR tablet   Oral   Take 1 tablet (300 mg total) by mouth every 12 (twelve) hours. For mood stabilization.   60 tablet   0   . OLANZapine zydis (ZYPREXA) 15 MG disintegrating tablet   Oral   Take 1 tablet (15 mg total) by mouth 2 (two) times daily. For psychosis and mental clarity.   60 tablet   0   . trihexyphenidyl (ARTANE) 5 MG tablet   Oral   Take 1 tablet (5 mg total) by mouth 2 (two) times daily with a meal. For prevention of side effects.   60 tablet   0   . venlafaxine XR (EFFEXOR-XR) 75 MG 24 hr capsule   Oral   Take 1 capsule (75 mg total) by mouth daily. Each morning for depression and anxiety.   30 capsule   0     BP 127/90  Pulse 80  Temp(Src) 98.4 F (36.9 C) (Oral)  Resp 22  SpO2 100%  Physical Exam  Nursing note and vitals reviewed. Constitutional: She appears well-developed and well-nourished. No distress.  Awake, alert, nontoxic appearance  HENT:  Head: Atraumatic.  Eyes: Conjunctivae and EOM are normal. Pupils are equal, round,  and reactive to light. Right eye exhibits no discharge. Left eye exhibits no discharge.  Neck: Neck supple.  Cardiovascular: Normal rate and regular rhythm.   Pulmonary/Chest: Effort normal. No respiratory distress. She exhibits no tenderness.  Abdominal: Soft. There is no tenderness. There is no rebound.  Musculoskeletal: She exhibits no tenderness.  ROM appears intact, no obvious focal weakness  Neurological:  Mental status and motor strength appears intact  Skin: No rash noted.  Psychiatric: Her affect is inappropriate. Her speech is tangential. Thought content is delusional. Cognition and memory are impaired. She expresses impulsivity and inappropriate judgment. She is inattentive.    ED Course  Procedures (including critical  care time)  12:59 PM Patient is here for voluntary psychiatric admission for further management of her manic state.  Pt is medically cleared, consider she has unremarkable head CT on feb 26th and was also evaluated in ER recently for same complaint.    Labs Reviewed  COMPREHENSIVE METABOLIC PANEL - Abnormal; Notable for the following:    GFR calc non Af Amer 62 (*)    GFR calc Af Amer 72 (*)    All other components within normal limits  SALICYLATE LEVEL - Abnormal; Notable for the following:    Salicylate Lvl <2.0 (*)    All other components within normal limits  CBC WITH DIFFERENTIAL  ACETAMINOPHEN LEVEL  ETHANOL  URINE RAPID DRUG SCREEN (HOSP PERFORMED)  PREGNANCY, URINE   No results found.   1. Psychosis   2. Paranoid schizophrenia    BP 127/90  Pulse 80  Temp(Src) 98.4 F (36.9 C) (Oral)  Resp 22  SpO2 100%     MDM          Fayrene Helper, PA-C 03/03/13 1504

## 2013-03-03 NOTE — ED Notes (Signed)
Husband called in initially wife didn't want to speak to him because she was finally getting some sleep. She gave verbal permission for writer to discuss care with husband. Husband said he took wife to psychiatrist and told him that he only wants wife on Prolixin 5mg  TID, she was on that in 2008 and she was fine. She's currently on too many medications. The psychiatrist recommended inpatient treatment to make this medication change. Pt came up to window and is now speaking to husband.

## 2013-03-03 NOTE — ED Notes (Signed)
I took the patient to the bathroom and placed her in blue scrubs.

## 2013-03-03 NOTE — Progress Notes (Addendum)
CSW met with pt at bedside, pt reports AH and VH.  Patient states that she has found [facility] and that she is able to see and talk to her deceased friend Verlon Au. Pt reports she need sunglassess to help her because the sun's to bright and she can't see welll. Pt reports she has thoughts of putting her arm down the garbage disposal and brief thoughts of hurting her husband but no plan.   Catha Gosselin, LCSWA  [phone] 03/03/2013 1013pm

## 2013-03-03 NOTE — BH Assessment (Signed)
[facility] Assessment Progress Note     Spoke with Jorje Guild PA who agreed to admit pt to [facility] once a [address] bed is available. Spoke with Baxter Hire CSW at [facility] to notify her of this. Consulted with AC Luwanda who will call Jorje Guild for pt's medication orders.  Glorious Peach, MS, LCASA Assessment Counselor

## 2013-03-04 LAB — LITHIUM LEVEL: Lithium Lvl: 0.41 mEq/L — ABNORMAL LOW (ref 0.80–1.40)

## 2013-03-04 LAB — PREGNANCY, URINE: Preg Test, Ur: NEGATIVE

## 2013-03-04 LAB — RAPID URINE DRUG SCREEN, HOSP PERFORMED
Amphetamines: NOT DETECTED
Barbiturates: NOT DETECTED
Benzodiazepines: NOT DETECTED
Tetrahydrocannabinol: NOT DETECTED

## 2013-03-04 LAB — VALPROIC ACID LEVEL: Valproic Acid Lvl: <10 ug/mL — ABNORMAL LOW (ref 50.0–100.0)

## 2013-03-04 MED ORDER — BENZTROPINE MESYLATE 1 MG/ML IJ SOLN
1.0000 mg | Freq: Two times a day (BID) | INTRAMUSCULAR | Status: DC
  Filled 2013-03-04: qty 2

## 2013-03-04 MED ORDER — BENZTROPINE MESYLATE 1 MG PO TABS
1.0000 mg | ORAL_TABLET | Freq: Two times a day (BID) | ORAL | Status: DC
  Administered 2013-03-04 – 2013-03-07 (×6): 1 mg via ORAL
  Filled 2013-03-04 (×7): qty 1

## 2013-03-04 MED ORDER — FLUPHENAZINE HCL 10 MG PO TABS
10.0000 mg | ORAL_TABLET | Freq: Two times a day (BID) | ORAL | Status: DC
  Administered 2013-03-05 – 2013-03-07 (×5): 10 mg via ORAL
  Filled 2013-03-04 (×7): qty 1

## 2013-03-04 NOTE — ED Notes (Signed)
Up in room, nad 

## 2013-03-04 NOTE — ED Notes (Signed)
Sitting quietly on the bed-pt stated she is "trying to figure out how to send this (her lunch tray) to the [employer]."
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
## 2013-03-04 NOTE — ED Notes (Signed)
telepsych in progress 

## 2013-03-04 NOTE — ED Notes (Signed)
Dr wentz into see 

## 2013-03-04 NOTE — ED Provider Notes (Signed)
Charlotte [name] is a 47 y.o. female be evaluated for psychosis. She continues to be delusional, today. She is calm and cooperative.  Tele-psychiatry consultation ordered.     Flint Melter, MD 03/04/13 [phone]
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
## 2013-03-04 NOTE — ED Notes (Signed)
md Jacubaowitz made aware of pt meds cogentin and artane same thearpeutic med.  md will notify pharmacy.  Awaiting call back from md.

## 2013-03-04 NOTE — ED Provider Notes (Signed)
Psychiatry consult reviewed recommends Prolixin, Cogentin, psychiatric admission. Act person arranging for admission  Doug Sou, MD 03/04/13 1954

## 2013-03-04 NOTE — ED Notes (Signed)
Nad, up in the room

## 2013-03-04 NOTE — ED Notes (Signed)
Up to the bathroom 

## 2013-03-05 LAB — URINE MICROSCOPIC-ADD ON

## 2013-03-05 LAB — URINALYSIS, ROUTINE W REFLEX MICROSCOPIC
Bilirubin Urine: NEGATIVE
Hgb urine dipstick: NEGATIVE
Ketones, ur: NEGATIVE mg/dL
Nitrite: NEGATIVE
pH: 6 (ref 5.0–8.0)

## 2013-03-05 NOTE — ED Notes (Signed)
Pt's husband into see 

## 2013-03-05 NOTE — ED Provider Notes (Signed)
Patient resting on the evaluation this morning. She denies complaint. Awaiting placement.  BP 133/89  Pulse 102  Temp(Src) 98.1 F (36.7 C) (Oral)  Resp 14  SpO2 100%   Glynn Octave, MD 03/05/13 [phone]

## 2013-03-05 NOTE — ED Notes (Signed)
Pt's mother reports that the patients medications have been changed x2 and has resulted in a change in her behaviors.  She (mom) reports that after the first time it was changed- it was changed back and the pt returned to her normal base line.  Mom reports that her meds were changed about 2-3 yrs ago and she English as a second language teacher) has not been the same.

## 2013-03-05 NOTE — Progress Notes (Addendum)
Chaplain paged at 0735 to speak to Charlotte [name]'s husband in the ED waiting room. Conversation dealt with his concerns about his wife's current level of medications, her spiritual health and her possible placement in [place]. Passed concern about medication levels to attending nurse in anticipation that when he visits his wife at 0900 he will bring this subject up to the medical staff.  Conversation also dealt with his spiritual concerns, his worries and his hopes for their life together.  Charlotte [name]. Charlotte [name], DMin, MDiv Chaplain

## 2013-03-05 NOTE — ED Notes (Signed)
Up tot he bathroom to shower and change scrubs 

## 2013-03-05 NOTE — ED Notes (Signed)
Husband reports that he does not want her to go to [place]

## 2013-03-05 NOTE — ED Notes (Signed)
Up to the desk on the phone 

## 2013-03-05 NOTE — ED Notes (Signed)
Up to the desk, tearful, wanting to leave so she can go home and see her dog.  Reasurred, easily redirected.

## 2013-03-05 NOTE — ED Notes (Signed)
Pt declines to wear the red socks and does not like to keep things in her room taking them to desk or throwing them in the trash

## 2013-03-05 NOTE — ED Notes (Signed)
Pts mother in

## 2013-03-05 NOTE — ED Notes (Signed)
Pt's glasses put in locker #43

## 2013-03-05 NOTE — ED Notes (Signed)
Pt's husband does not want her to go to [place] reports that she was recently there and had not improved.  [facility] aware

## 2013-03-05 NOTE — ED Notes (Signed)
Nephew into see

## 2013-03-05 NOTE — ED Notes (Signed)
Sleeping, resp even and unlabored

## 2013-03-05 NOTE — ED Notes (Signed)
Husband doesn't want wife to go to [facility] for treatment he said she came out of there worse than when she went in when she was hospitalized there.

## 2013-03-05 NOTE — ED Notes (Signed)
Pt up to the bathroom to shower and asked that her mother stay w/ her so that she can feel safe-mom agreed.

## 2013-03-05 NOTE — ED Notes (Signed)
Previous meds verified w/ husband--fluphenazine 5 mg TID, cogentin unknown dose BID.

## 2013-03-05 NOTE — ED Notes (Signed)
Patient restless and frequently approaching the nursing station asking for multiple specialist consults for many different ailments. Patient confused and with loose association at times.

## 2013-03-05 NOTE — ED Notes (Signed)
Pt's mom into see 

## 2013-03-05 NOTE — ED Notes (Signed)
Sitting quielty in room,  Nad

## 2013-03-05 NOTE — ED Notes (Signed)
Husband into see 

## 2013-03-05 NOTE — ED Notes (Signed)
Patient quickly approached desk, stating "I don't trust her, she's a white nurse; I want you to be my nurse because you are black and I trust black nurses only". Pt unable to be redirected to her room, security called for backup. Pt eventually led to her room. No distress noted.

## 2013-03-06 NOTE — Progress Notes (Addendum)
Per discussion with act team, patient accepted to [facility] not available until tomorrow morning due to weather. CSW updated [facility]. Per discussion with [facility], act/csw will need to call in the morning to confirm bed before 8am because bed can only be held for 24 hours.   Catha Gosselin, LCSWA  [phone] 03/06/2013 1658pm
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
## 2013-03-06 NOTE — ED Provider Notes (Signed)
Pt stable awaiting placement  Benny Lennert, MD 03/06/13 [phone]

## 2013-03-06 NOTE — ED Notes (Deleted)
Patient discharge via ambulatory with security and NT. NAD.

## 2013-03-06 NOTE — ED Notes (Signed)
Patient accepted by [facility]. Report to be given to [phone]. Idalia Needle, [facility] Team notified of acceptance.

## 2013-03-06 NOTE — BHH Counselor (Signed)
Patient accepted to [facility] by Dr. Vita Barley. The call report # is [phone]. Pt pending transport via [place].

## 2013-03-06 NOTE — BHH Counselor (Signed)
Per Marcelino Duster RN at [facility], pt has been accepted to [facility] and can be transferred after shift change, Room 426A. Transportation will need to be arranged.  Evette Cristal, [place] Assessment Counselor

## 2013-03-06 NOTE — ED Notes (Signed)
Patient constantly walking that hallway after being asked to remain in her room. Patient is hard to redirect. Will continue monitor patient.

## 2013-03-07 LAB — URINE CULTURE: Colony Count: NO GROWTH

## 2013-03-07 NOTE — ED Notes (Signed)
Patient has constantly been wandering hallways.

## 2013-03-25 ENCOUNTER — Other Ambulatory Visit (HOSPITAL_COMMUNITY): Payer: Self-pay | Admitting: Psychiatry

## 2013-03-27 ENCOUNTER — Other Ambulatory Visit (HOSPITAL_COMMUNITY): Payer: Self-pay | Admitting: Psychiatry

## 2013-03-27 NOTE — Telephone Encounter (Signed)
Patient no longer with this practice

## 2013-04-05 ENCOUNTER — Other Ambulatory Visit (HOSPITAL_COMMUNITY): Payer: Self-pay | Admitting: Physician Assistant

## 2013-07-24 ENCOUNTER — Telehealth (HOSPITAL_COMMUNITY): Payer: Self-pay

## 2013-07-24 NOTE — Telephone Encounter (Signed)
11:01am 07/24/13 Patient's came into the [facility] stating that he need to speak with some higher in the ACT team because Rod told him and his wife that the ACT team is for him and his wife and he over heard someone in the ACT team tell his wife that they are there for her not him.the patient's husband also stated that he wanted he to come back to this office - after speaking with Dr. Lolly Mustache we can't take her back due to she was discharge from this practice in February and referred to another provider.  I Nettie Elm) gave the pt's husband Clois Dupes number the Interior and spatial designer #.

## 2013-08-14 ENCOUNTER — Emergency Department (HOSPITAL_COMMUNITY)
Admission: EM | Admit: 2013-08-14 | Discharge: 2013-08-14 | Disposition: A | Discharge status: Home / Self Care | Payer: Medicare Other | Attending: Emergency Medicine | Admitting: Emergency Medicine

## 2013-08-14 DIAGNOSIS — F329 Major depressive disorder, single episode, unspecified: Secondary | ICD-10-CM | POA: Insufficient documentation

## 2013-08-14 DIAGNOSIS — Z79899 Other long term (current) drug therapy: Secondary | ICD-10-CM | POA: Insufficient documentation

## 2013-08-14 DIAGNOSIS — Z87891 Personal history of nicotine dependence: Secondary | ICD-10-CM | POA: Insufficient documentation

## 2013-08-14 DIAGNOSIS — F209 Schizophrenia, unspecified: Secondary | ICD-10-CM | POA: Insufficient documentation

## 2013-08-14 DIAGNOSIS — K0889 Other specified disorders of teeth and supporting structures: Secondary | ICD-10-CM

## 2013-08-14 DIAGNOSIS — K089 Disorder of teeth and supporting structures, unspecified: Secondary | ICD-10-CM | POA: Insufficient documentation

## 2013-08-14 DIAGNOSIS — R509 Fever, unspecified: Secondary | ICD-10-CM | POA: Insufficient documentation

## 2013-08-14 DIAGNOSIS — F3289 Other specified depressive episodes: Secondary | ICD-10-CM | POA: Insufficient documentation

## 2013-08-14 MED ORDER — HYDROCODONE-ACETAMINOPHEN 5-325 MG PO TABS: 1.0000 | ORAL_TABLET | Freq: Four times a day (QID) | ORAL | Status: DC | PRN

## 2013-08-14 MED ORDER — PENICILLIN V POTASSIUM 500 MG PO TABS
500.0000 mg | ORAL_TABLET | Freq: Once | ORAL | Status: AC
  Administered 2013-08-14: 500 mg via ORAL
  Filled 2013-08-14: qty 1

## 2013-08-14 MED ORDER — PROMETHAZINE HCL 25 MG PO TABS: 25.0000 mg | ORAL_TABLET | Freq: Four times a day (QID) | ORAL | Status: DC | PRN

## 2013-08-14 MED ORDER — PENICILLIN V POTASSIUM 500 MG PO TABS: 500.0000 mg | ORAL_TABLET | Freq: Four times a day (QID) | ORAL | Status: DC

## 2013-08-14 NOTE — ED Notes (Signed)
Pt c/o L side jaw pain and swelling since this morning. Pt states she has a cracked tooth to lower back side of mouth on L side. Pt with no acute distress. Pt a/o x 4. Ambulatory to exam room with steady gait.

## 2013-08-14 NOTE — ED Provider Notes (Signed)
CSN: [id number]     Arrival date & time 08/14/13  2242 History  This chart was scribed for Junious Silk, PA working with Dagmar Hait, MD by Quintella Reichert, ED Scribe. This patient was seen in room WTR8/WTR8 and the patient's care was started at 11:10 PM.    Chief Complaint  Patient presents with  . Dental Pain    The history is provided by the patient. No language interpreter was used.    HPI Comments: Charlotte [name] is a 47 y.o. female who presents to the Emergency Department complaining of progressively-worsening lower left dental pain.  Pt states she has had a broken a tooth in that area with associated intermittent mild pain for some time.  However this morning her pain suddenly became much more severe.  Pain is described as throbbing and radiates throughout her left cheek and into the left side of her neck.  She has attempted to treat pain with Aleve, with significant relief.  Pt also complains of night sweats for the past several days as well as a low grade subjective fever earlier today.  On admission temperature is 99 F.   Past Medical History  Diagnosis Date  . Headache   . Paranoid schizophrenia   . Depression   . Schizophrenia   . Low back pain     No past surgical history on file.   Family History  Problem Relation Age of Onset  . Diabetes    . Hypertension    . Breast cancer      History  Substance Use Topics  . Smoking status: Former Smoker -- 0.01 packs/day for 5 years  . Smokeless tobacco: Not on file  . Alcohol Use: No    OB History   Grav Para Term Preterm Abortions TAB SAB Ect Mult Living                   Review of Systems  Constitutional: Positive for fever.  HENT: Positive for dental problem.   All other systems reviewed and are negative.      Allergies  Divalproex sodium; Risperidone; and Sulfonamide derivatives  Home Medications   Current Outpatient Rx  Name  Route  Sig  Dispense  Refill  . cloZAPine (CLOZARIL) 100  MG tablet   Oral   Take 50-200 mg by mouth 2 (two) times daily. 0.5 tab in am, 2 tabs qhs         . lithium carbonate 300 MG capsule   Oral   Take 300-600 mg by mouth 2 (two) times daily with a meal. 1 capsule in am, 2 caps in pm         . LORazepam (ATIVAN) 0.5 MG tablet   Oral   Take 0.25 mg by mouth at bedtime.         . naproxen sodium (ANAPROX) 220 MG tablet   Oral   Take 440 mg by mouth 2 (two) times daily as needed (for pain).          BP 140/88  Pulse 111  Temp(Src) 99 F (37.2 C) (Oral)  Resp 16  SpO2 98%  Physical Exam  Nursing note and vitals reviewed. Constitutional: She is oriented to person, place, and time. She appears well-developed and well-nourished. No distress.  HENT:  Head: Normocephalic and atraumatic.  Right Ear: External ear normal.  Left Ear: External ear normal.  Nose: Nose normal.  Mouth/Throat: Oropharynx is clear and moist.  No trismus, submental edema or tongue elevation.  Tenderness to left lower gum.  Eyes: Conjunctivae are normal.  Neck: Normal range of motion.  Cardiovascular: Normal rate, regular rhythm and normal heart sounds.   Pulmonary/Chest: Effort normal and breath sounds normal. No stridor. No respiratory distress. She has no wheezes. She has no rales.  Abdominal: Soft. She exhibits no distension.  Musculoskeletal: Normal range of motion.  Neurological: She is alert and oriented to person, place, and time. She has normal strength.  Skin: Skin is warm and dry. She is not diaphoretic. No erythema.  Psychiatric: She has a normal mood and affect. Her behavior is normal.    ED Course  Dental Performed by: Mora Bellman Authorized by: Mora Bellman Consent: Verbal consent obtained. written consent not obtained. The procedure was performed in an emergent situation. Risks and benefits: risks, benefits and alternatives were discussed Consent given by: patient Patient understanding: patient states understanding of the  procedure being performed Patient consent: the patient's understanding of the procedure matches consent given Required items: required blood products, implants, devices, and special equipment available Patient identity confirmed: verbally with patient and arm band Time out: Immediately prior to procedure a "time out" was called to verify the correct patient, procedure, equipment, support staff and site/side marked as required. Local anesthesia used: yes Local anesthetic: bupivacaine 0.25% with epinephrine Anesthetic total: 1.8 ml Patient sedated: no Patient tolerance: Patient tolerated the procedure well with no immediate complications.   (including critical care time)  DIAGNOSTIC STUDIES: Oxygen Saturation is 98% on room air, normal by my interpretation.    COORDINATION OF CARE: 11:17 PM-Discussed treatment plan which includes penicillin and dental referral with pt at bedside and pt agreed to plan.    Labs Review Labs Reviewed - No data to display  Imaging Review No results found.  MDM   1. Pain, dental    Patient with toothache.  No gross abscess.  Exam unconcerning for Ludwig's angina or spread of infection.  Will treat with penicillin and pain medicine.  Urged patient to follow-up with dentist.  Patient with full relief of symptoms after dental block.      I personally performed the services described in this documentation, which was scribed in my presence. The recorded information has been reviewed and is accurate.     Mora Bellman, PA-C 08/15/13 [phone]

## 2013-08-17 NOTE — ED Provider Notes (Signed)
Medical screening examination/treatment/procedure(s) were performed by non-physician practitioner and as supervising physician I was immediately available for consultation/collaboration.   William Janmarie Smoot, MD 08/17/13 0737 

## 2013-10-26 ENCOUNTER — Other Ambulatory Visit: Payer: Self-pay

## 2013-12-04 ENCOUNTER — Encounter (HOSPITAL_COMMUNITY): Payer: Self-pay | Admitting: Emergency Medicine

## 2013-12-04 ENCOUNTER — Emergency Department (HOSPITAL_COMMUNITY)
Admission: EM | Admit: 2013-12-04 | Discharge: 2013-12-05 | Disposition: A | Discharge status: Home / Self Care | Payer: Medicare Other | Attending: Emergency Medicine | Admitting: Emergency Medicine

## 2013-12-04 DIAGNOSIS — F200 Paranoid schizophrenia: Secondary | ICD-10-CM | POA: Insufficient documentation

## 2013-12-04 DIAGNOSIS — R55 Syncope and collapse: Secondary | ICD-10-CM

## 2013-12-04 DIAGNOSIS — Z87891 Personal history of nicotine dependence: Secondary | ICD-10-CM | POA: Insufficient documentation

## 2013-12-04 DIAGNOSIS — Z3202 Encounter for pregnancy test, result negative: Secondary | ICD-10-CM | POA: Insufficient documentation

## 2013-12-04 DIAGNOSIS — F3289 Other specified depressive episodes: Secondary | ICD-10-CM | POA: Insufficient documentation

## 2013-12-04 DIAGNOSIS — F329 Major depressive disorder, single episode, unspecified: Secondary | ICD-10-CM | POA: Insufficient documentation

## 2013-12-04 DIAGNOSIS — Z79899 Other long term (current) drug therapy: Secondary | ICD-10-CM | POA: Insufficient documentation

## 2013-12-04 DIAGNOSIS — R42 Dizziness and giddiness: Secondary | ICD-10-CM | POA: Insufficient documentation

## 2013-12-04 NOTE — ED Notes (Signed)
Pt reports that she has had near syncopal episodes after having a BM today. Pt and husband are poor historians.  Pt reports her menstruation is "off schedule" as well. Pt denies pain. Pt a&o x4 at this time, states she is followed by ACT.

## 2013-12-05 LAB — COMPREHENSIVE METABOLIC PANEL
Albumin: 4.4 g/dL (ref 3.5–5.2)
Alkaline Phosphatase: 70 U/L (ref 39–117)
BUN: 6 mg/dL (ref 6–23)
CO2: 24 mEq/L (ref 19–32)
Chloride: 100 mEq/L (ref 96–112)
Creatinine, Ser: 1 mg/dL (ref 0.50–1.10)
GFR calc non Af Amer: 66 mL/min — ABNORMAL LOW (ref >90)
Potassium: 3.4 mEq/L — ABNORMAL LOW (ref 3.5–5.1)
Total Bilirubin: 0.4 mg/dL (ref 0.3–1.2)

## 2013-12-05 LAB — CBC WITH DIFFERENTIAL/PLATELET
HCT: 40.3 % (ref 36.0–46.0)
Hemoglobin: 13.3 g/dL (ref 12.0–15.0)
Lymphocytes Relative: 19 % (ref 12–46)
Lymphs Abs: 1.9 10*3/uL (ref 0.7–4.0)
Monocytes Absolute: 0.7 10*3/uL (ref 0.1–1.0)
Monocytes Relative: 7 % (ref 3–12)
Neutro Abs: 7.3 10*3/uL (ref 1.7–7.7)
Neutrophils Relative %: 73 % (ref 43–77)
RBC: 4.68 MIL/uL (ref 3.87–5.11)
WBC: 10 10*3/uL (ref 4.0–10.5)

## 2013-12-05 LAB — POCT I-STAT TROPONIN I: Troponin i, poc: 0 ng/mL (ref 0.00–0.08)

## 2013-12-05 NOTE — ED Provider Notes (Signed)
CSN: [id number]     Arrival date & time 12/04/13  2301 History   First MD Initiated Contact with Patient 12/05/13 0301     Chief Complaint  Patient presents with  . Near Syncope   HPI  History provided by the patient and husband. Patient is a 47 year old female with history of paranoid schizophrenia who presents with complaints of near syncope. Patient was sitting down to 2 bowel movements early yesterday morning around 6 AM when she suddenly began to feel lightheaded and woozy. She called out to her husband who came in to help her. He states that she began to look very pale and he helped lift her up. She also seemed drowsy for a brief time and slightly slumped over. Patient states that she remembers incidents but is also unsure of a brief LOC. She denied having any chest pain, heart palpitations or shortness of breath. She was able to be helped back to the bedroom and rested most of the morning. She was feeling well later in the afternoon and evening. Then patient began to complain again about feeling "off".  Husband called behavioral crisis line who recommended they come to the emergency department for evaluation. Patient currently reports feeling well. She denies any lightheadedness. Denies any diet change.   Past Medical History  Diagnosis Date  . Headache(784.0)   . Paranoid schizophrenia   . Depression   . Schizophrenia   . Low back pain    History reviewed. No pertinent past surgical history. Family History  Problem Relation Age of Onset  . Diabetes    . Hypertension    . Breast cancer     History  Substance Use Topics  . Smoking status: Former Smoker -- 0.01 packs/day for 5 years  . Smokeless tobacco: Not on file  . Alcohol Use: No   OB History   Grav Para Term Preterm Abortions TAB SAB Ect Mult Living                 Review of Systems  Constitutional: Negative for fever.  Eyes: Negative for visual disturbance.  Respiratory: Negative for shortness of breath.    Cardiovascular: Negative for chest pain and palpitations.  Gastrointestinal: Negative for nausea and vomiting.  Neurological: Positive for dizziness and light-headedness. Negative for syncope, facial asymmetry, weakness, numbness and headaches.  All other systems reviewed and are negative.    Allergies  Divalproex sodium; Risperidone; and Sulfonamide derivatives  Home Medications   Current Outpatient Rx  Name  Route  Sig  Dispense  Refill  . bismuth subsalicylate (PEPTO BISMOL) 262 MG/15ML suspension   Oral   Take 30 mLs by mouth every 6 (six) hours as needed for diarrhea or loose stools.         . cloZAPine (CLOZARIL) 100 MG tablet   Oral   Take 50-200 mg by mouth 2 (two) times daily. 0.5 tab in am, 2 tabs qhs         . lithium carbonate 300 MG capsule   Oral   Take 300-600 mg by mouth 2 (two) times daily with a meal. 1 capsule in am, 2 caps in pm          BP 125/72  Pulse 94  Temp(Src) 98.1 F (36.7 C) (Oral)  Resp 18  Ht 5\' 6"  (1.676 m)  Wt 208 lb (94.348 kg)  BMI 33.59 kg/m2  SpO2 99%  LMP 11/06/2013 Physical Exam  Nursing note and vitals reviewed. Constitutional: She is oriented to person,  place, and time. She appears well-developed and well-nourished. No distress.  HENT:  Head: Normocephalic.  Eyes: Conjunctivae and EOM are normal. Pupils are equal, round, and reactive to light.  Cardiovascular: Normal rate and regular rhythm.   Pulmonary/Chest: Effort normal and breath sounds normal. No respiratory distress. She has no wheezes. She has no rales.  Abdominal: Soft. There is no tenderness. There is no rebound and no guarding.  Musculoskeletal: Normal range of motion. She exhibits no edema and no tenderness.  Neurological: She is alert and oriented to person, place, and time. She has normal strength. No sensory deficit. Gait normal.  Skin: Skin is warm and dry. No rash noted.  Psychiatric: She has a normal mood and affect. Her behavior is normal.    ED  Course  Procedures   DIAGNOSTIC STUDIES: Oxygen Saturation is 100% on room air.    COORDINATION OF CARE:  Nursing notes reviewed. Vital signs reviewed. Initial pt interview and examination performed.   3:05 AM-patient seen and evaluated. She is well-appearing no acute distress. Has no complaints at this time. Episode occurred early yesterday morning. No return of symptoms through the evening. Discussed work up plan with pt at bedside, which includes lab testing. Pt agrees with plan.   Results for orders placed during the hospital encounter of 12/04/13  CBC WITH DIFFERENTIAL      Result Value Range   WBC 10.0  4.0 - 10.5 K/uL   RBC 4.68  3.87 - 5.11 MIL/uL   Hemoglobin 13.3  12.0 - 15.0 g/dL   HCT 11.9  14.7 - 82.9 %   MCV 86.1  78.0 - 100.0 fL   MCH 28.4  26.0 - 34.0 pg   MCHC 33.0  30.0 - 36.0 g/dL   RDW 56.2  13.0 - 86.5 %   Platelets 275  150 - 400 K/uL   Neutrophils Relative % 73  43 - 77 %   Neutro Abs 7.3  1.7 - 7.7 K/uL   Lymphocytes Relative 19  12 - 46 %   Lymphs Abs 1.9  0.7 - 4.0 K/uL   Monocytes Relative 7  3 - 12 %   Monocytes Absolute 0.7  0.1 - 1.0 K/uL   Eosinophils Relative 0  0 - 5 %   Eosinophils Absolute 0.0  0.0 - 0.7 K/uL   Basophils Relative 0  0 - 1 %   Basophils Absolute 0.0  0.0 - 0.1 K/uL  COMPREHENSIVE METABOLIC PANEL      Result Value Range   Sodium 136  135 - 145 mEq/L   Potassium 3.4 (*) 3.5 - 5.1 mEq/L   Chloride 100  96 - 112 mEq/L   CO2 24  19 - 32 mEq/L   Glucose, Bld 110 (*) 70 - 99 mg/dL   BUN 6  6 - 23 mg/dL   Creatinine, Ser 7.84  0.50 - 1.10 mg/dL   Calcium 9.6  8.4 - 69.6 mg/dL   Total Protein 7.4  6.0 - 8.3 g/dL   Albumin 4.4  3.5 - 5.2 g/dL   AST 18  0 - 37 U/L   ALT 18  0 - 35 U/L   Alkaline Phosphatase 70  39 - 117 U/L   Total Bilirubin 0.4  0.3 - 1.2 mg/dL   GFR calc non Af Amer 66 (*) >90 mL/min   GFR calc Af Amer 77 (*) >90 mL/min  LITHIUM LEVEL      Result Value Range   Lithium Lvl 0.39 (*)  0.80 - 1.40 mEq/L   POCT I-STAT TROPONIN I      Result Value Range   Troponin i, poc 0.00  0.00 - 0.08 ng/mL   Comment 3                 EKG Interpretation    Date/Time:  Tuesday December 05 2013 02:12:43 EST Ventricular Rate:  87 PR Interval:  163 QRS Duration: 111 QT Interval:  428 QTC Calculation: 515 R Axis:   27 Text Interpretation:  Sinus rhythm Borderline T wave abnormalities Prolonged QT interval No significant change since last tracing Confirmed by OPITZ  MD, BRIAN ([id number]) on 12/05/2013 3:23:55 AM            MDM   1. Near syncope        Angus Seller, [place] 12/05/13 [phone]

## 2013-12-05 NOTE — ED Provider Notes (Signed)
Medical screening examination/treatment/procedure(s) were performed by non-physician practitioner and as supervising physician I was immediately available for consultation/collaboration.    Thaddeus Evitts, MD 12/05/13 0626 

## 2015-01-24 IMAGING — CT CT HEAD W/O CM
1 series · 16 of 30 positions shown, 20 images · non-contrast
Comparison: None.

CLINICAL DATA: Altered mental status.

CT HEAD WITHOUT CONTRAST
TECHNIQUE: Contiguous axial images were obtained from the base of
the skull through the vertex without contrast.

[Series 2: headseq 4.8 h45s · axial · 0.43mm/px · z∈[+936,+1096]mm · 16 of 36 slices shown, 20 images]
[im 2/36  brain]
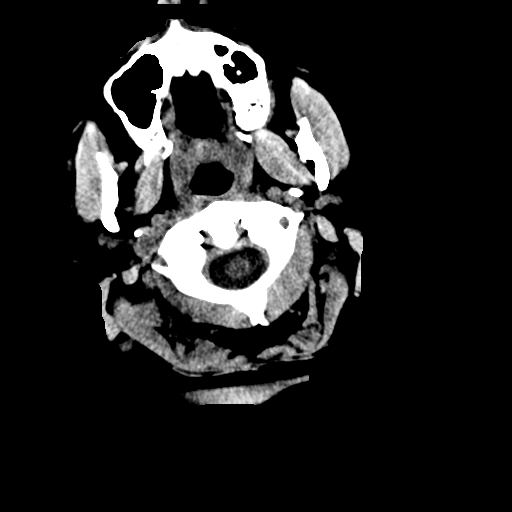
[im 2/36  bone]
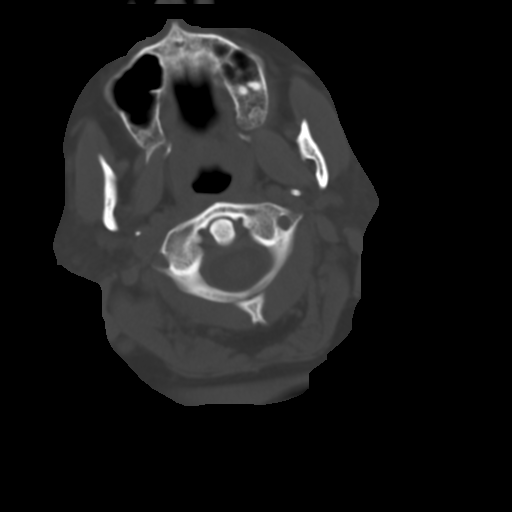
[im 4/36  brain]
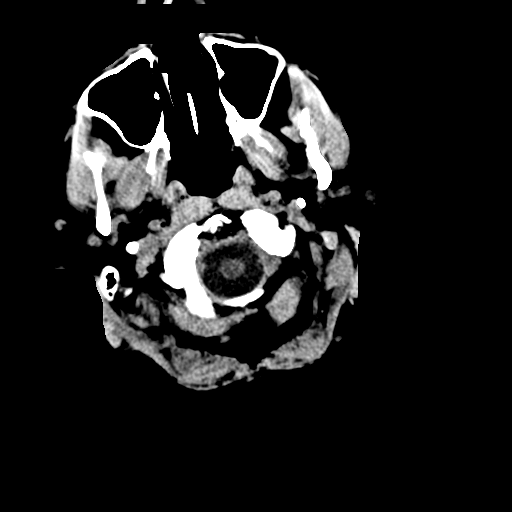
[im 7/36  brain]
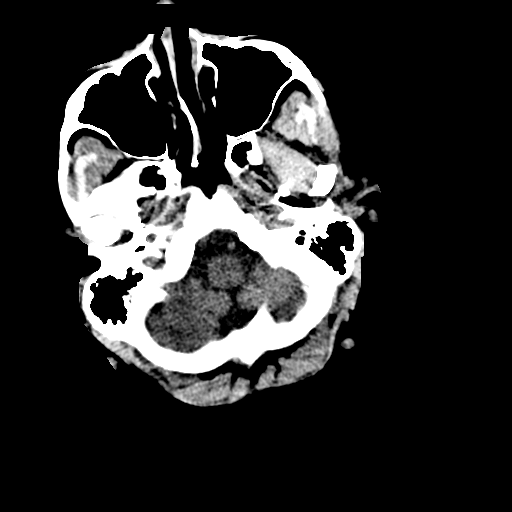
[im 9/36  brain]
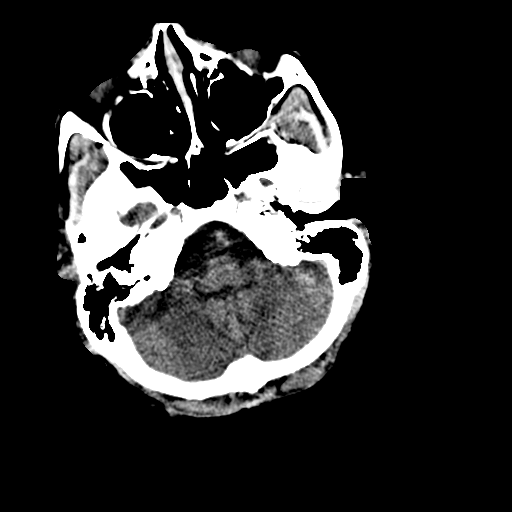
[im 10/36  brain]
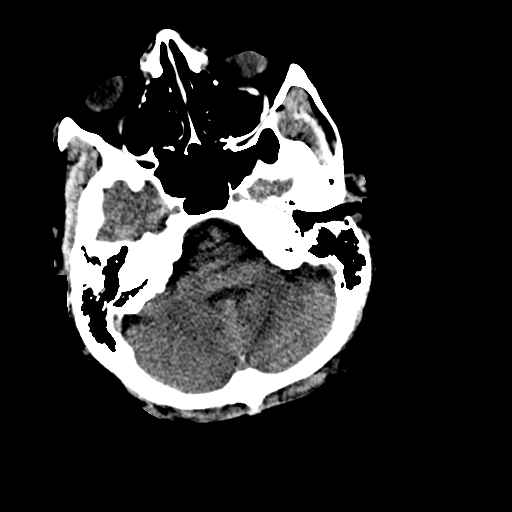
[im 10/36  bone]
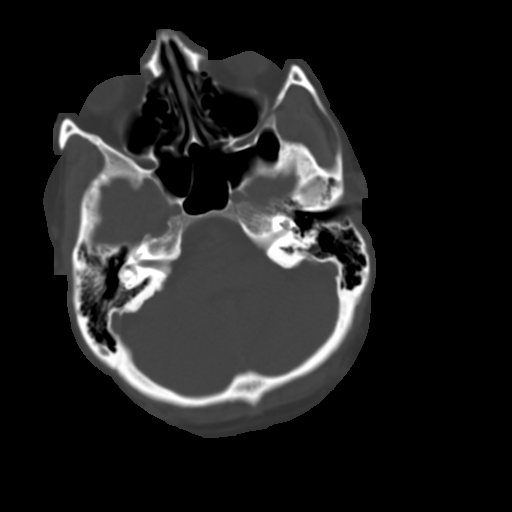
[im 13/36  brain]
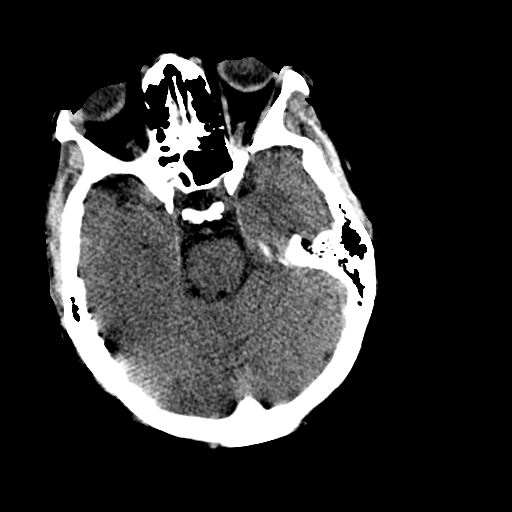
[im 15/36  brain]
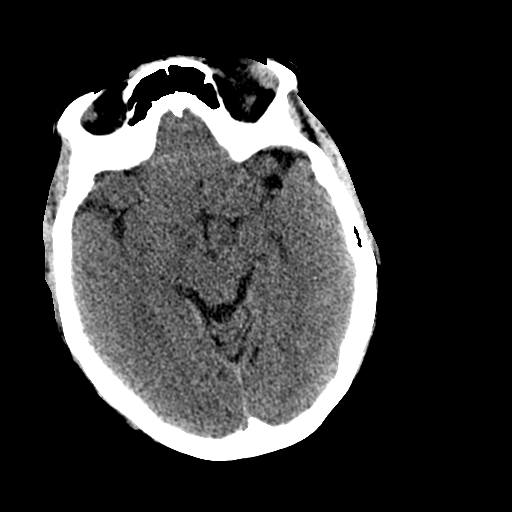
[im 17/36  brain]
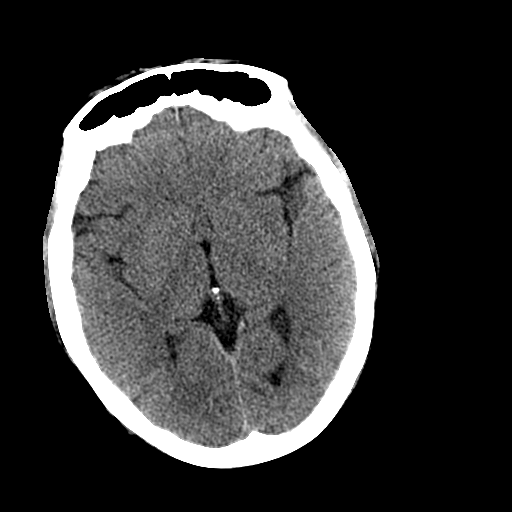
[im 19/36  brain]
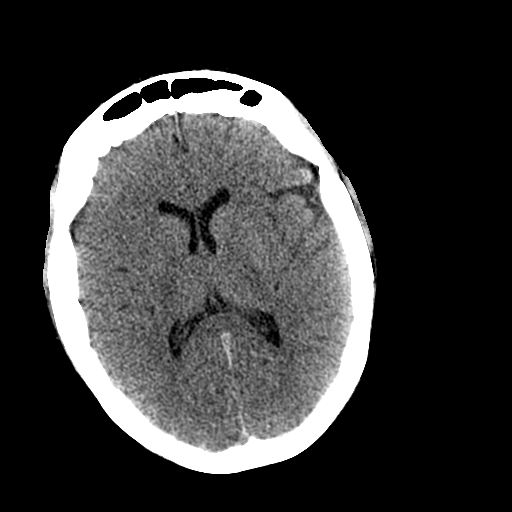
[im 19/36  bone]
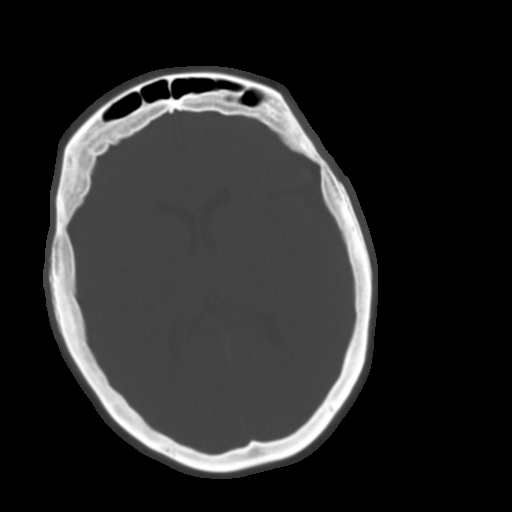
[im 21/36  brain]
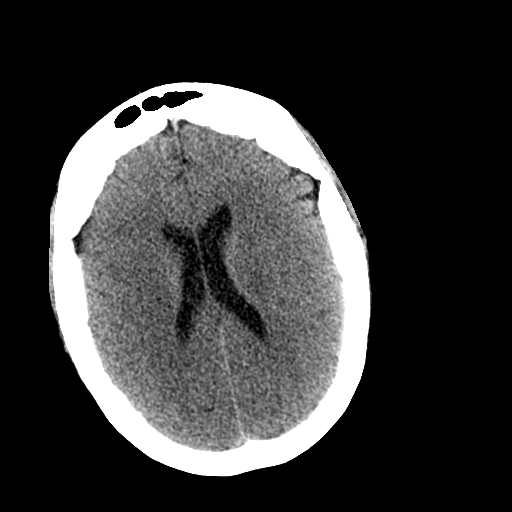
[im 23/36  brain]
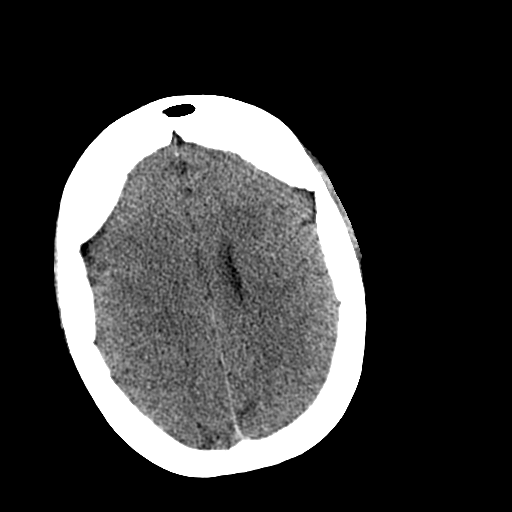
[im 26/36  brain]
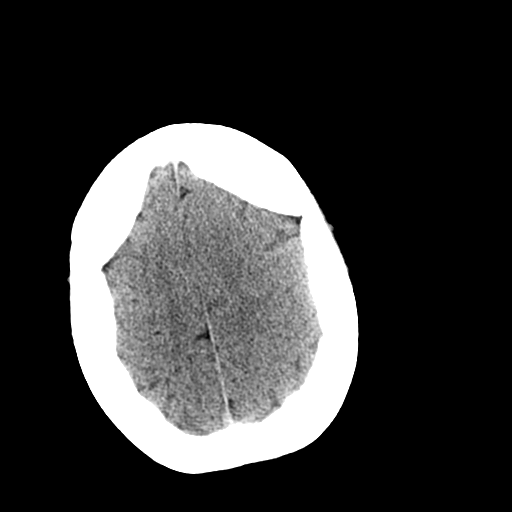
[im 27/36  brain]
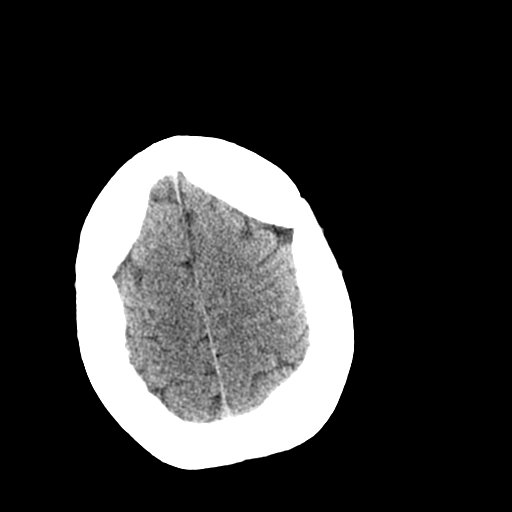
[im 27/36  bone]
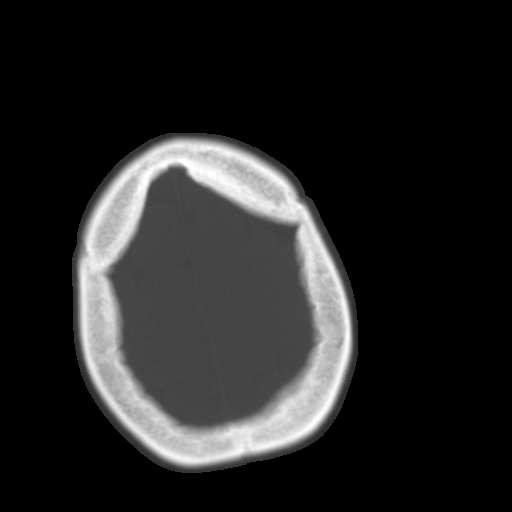
[im 29/36  brain]
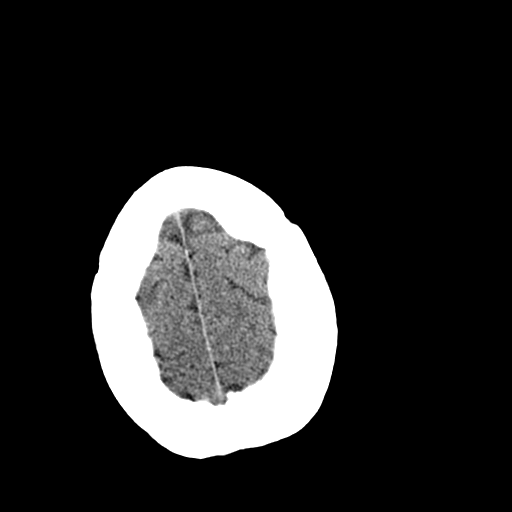
[im 32/36  brain]
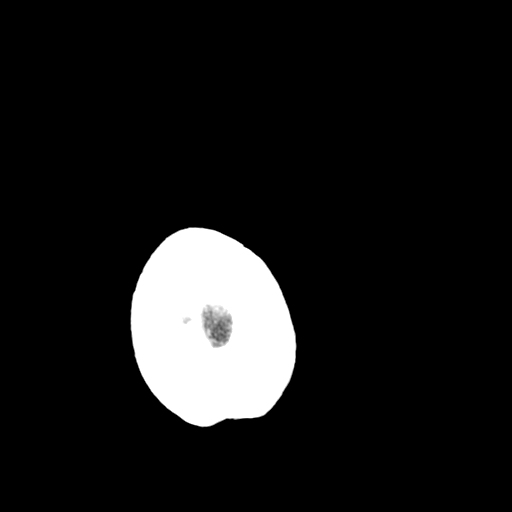
[im 34/36  brain]
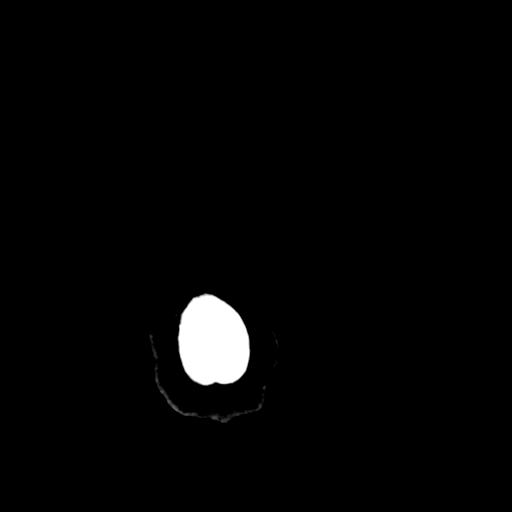

[16 of 30 positions shown; findings below may reference images not displayed]

FINDINGS: No acute intracranial abnormality.  Specifically, no
hemorrhage, hydrocephalus, mass lesion, acute infarction, or
significant intracranial injury.  No acute calvarial abnormality.
Visualized paranasal sinuses and mastoids clear.  Orbital soft
tissues unremarkable.
IMPRESSION: Normal study.

## 2016-04-04 DIAGNOSIS — F250 Schizoaffective disorder, bipolar type: Secondary | ICD-10-CM | POA: Diagnosis not present

## 2016-04-04 DIAGNOSIS — Z79899 Other long term (current) drug therapy: Secondary | ICD-10-CM | POA: Insufficient documentation

## 2016-04-04 DIAGNOSIS — F200 Paranoid schizophrenia: Secondary | ICD-10-CM | POA: Diagnosis not present

## 2016-04-04 DIAGNOSIS — Z87891 Personal history of nicotine dependence: Secondary | ICD-10-CM | POA: Insufficient documentation

## 2016-04-04 DIAGNOSIS — F3110 Bipolar disorder, current episode manic without psychotic features, unspecified: Secondary | ICD-10-CM | POA: Diagnosis not present

## 2016-04-04 DIAGNOSIS — F419 Anxiety disorder, unspecified: Secondary | ICD-10-CM | POA: Diagnosis not present

## 2016-04-04 DIAGNOSIS — F309 Manic episode, unspecified: Secondary | ICD-10-CM | POA: Diagnosis present

## 2016-04-05 ENCOUNTER — Encounter (HOSPITAL_COMMUNITY): Payer: Self-pay | Admitting: Emergency Medicine

## 2016-04-05 ENCOUNTER — Emergency Department (HOSPITAL_COMMUNITY)
Admission: EM | Admit: 2016-04-05 | Discharge: 2016-04-05 | Payer: Medicare Other | Attending: Emergency Medicine | Admitting: Emergency Medicine

## 2016-04-05 DIAGNOSIS — F250 Schizoaffective disorder, bipolar type: Secondary | ICD-10-CM | POA: Diagnosis not present

## 2016-04-05 DIAGNOSIS — F3110 Bipolar disorder, current episode manic without psychotic features, unspecified: Secondary | ICD-10-CM

## 2016-04-05 LAB — DIFFERENTIAL
Basophils Absolute: 0 10*3/uL (ref 0.0–0.1)
Basophils Relative: 0 %
EOS ABS: 0.1 10*3/uL (ref 0.0–0.7)
EOS PCT: 1 %
LYMPHS ABS: 1.8 10*3/uL (ref 0.7–4.0)
LYMPHS PCT: 22 %
MONOS PCT: 8 %
Monocytes Absolute: 0.6 10*3/uL (ref 0.1–1.0)
Neutro Abs: 5.7 10*3/uL (ref 1.7–7.7)
Neutrophils Relative %: 69 %

## 2016-04-05 LAB — CBC
HEMATOCRIT: 40.8 % (ref 36.0–46.0)
Hemoglobin: 13.8 g/dL (ref 12.0–15.0)
MCH: 28.5 pg (ref 26.0–34.0)
MCHC: 33.8 g/dL (ref 30.0–36.0)
MCV: 84.3 fL (ref 78.0–100.0)
Platelets: 261 10*3/uL (ref 150–400)
RBC: 4.84 MIL/uL (ref 3.87–5.11)
RDW: 13.7 % (ref 11.5–15.5)
WBC: 8.2 10*3/uL (ref 4.0–10.5)

## 2016-04-05 LAB — COMPREHENSIVE METABOLIC PANEL
ALBUMIN: 4.8 g/dL (ref 3.5–5.0)
ALK PHOS: 68 U/L (ref 38–126)
ALT: 20 U/L (ref 14–54)
AST: 23 U/L (ref 15–41)
Anion gap: 10 (ref 5–15)
BILIRUBIN TOTAL: 0.6 mg/dL (ref 0.3–1.2)
BUN: 9 mg/dL (ref 6–20)
CALCIUM: 9.8 mg/dL (ref 8.9–10.3)
CO2: 26 mmol/L (ref 22–32)
Chloride: 108 mmol/L (ref 101–111)
Creatinine, Ser: 1.09 mg/dL — ABNORMAL HIGH (ref 0.44–1.00)
GFR calc Af Amer: >60 mL/min (ref >60)
GFR calc non Af Amer: 59 mL/min — ABNORMAL LOW (ref >60)
GLUCOSE: 110 mg/dL — AB (ref 65–99)
POTASSIUM: 3.1 mmol/L — AB (ref 3.5–5.1)
Sodium: 144 mmol/L (ref 135–145)
TOTAL PROTEIN: 7.8 g/dL (ref 6.5–8.1)

## 2016-04-05 LAB — ETHANOL: Alcohol, Ethyl (B): <5 mg/dL (ref <5)

## 2016-04-05 LAB — SALICYLATE LEVEL: Salicylate Lvl: <4 mg/dL (ref 2.8–30.0)

## 2016-04-05 LAB — ACETAMINOPHEN LEVEL: Acetaminophen (Tylenol), Serum: <10 ug/mL — ABNORMAL LOW (ref 10–30)

## 2016-04-05 MED ORDER — VENLAFAXINE HCL ER 75 MG PO CP24
75.0000 mg | ORAL_CAPSULE | Freq: Every day | ORAL | Status: DC
  Administered 2016-04-05: 75 mg via ORAL
  Filled 2016-04-05 (×3): qty 1

## 2016-04-05 MED ORDER — BENZTROPINE MESYLATE 1 MG PO TABS
0.5000 mg | ORAL_TABLET | Freq: Two times a day (BID) | ORAL | Status: DC
  Administered 2016-04-05: 0.5 mg via ORAL
  Filled 2016-04-05: qty 1

## 2016-04-05 MED ORDER — BENZTROPINE MESYLATE 1 MG/ML IJ SOLN: 0.5000 mg | Freq: Two times a day (BID) | INTRAMUSCULAR | Status: DC

## 2016-04-05 MED ORDER — DIPHENHYDRAMINE HCL 50 MG/ML IJ SOLN
50.0000 mg | Freq: Once | INTRAMUSCULAR | Status: AC
  Administered 2016-04-05: 50 mg via INTRAMUSCULAR
  Filled 2016-04-05: qty 1

## 2016-04-05 MED ORDER — CLOZAPINE 100 MG PO TABS
100.0000 mg | ORAL_TABLET | Freq: Two times a day (BID) | ORAL | Status: DC
  Administered 2016-04-05: 100 mg via ORAL
  Filled 2016-04-05 (×3): qty 1

## 2016-04-05 MED ORDER — LORAZEPAM 2 MG/ML IJ SOLN
2.0000 mg | Freq: Once | INTRAMUSCULAR | Status: AC
  Administered 2016-04-05: 2 mg via INTRAMUSCULAR
  Filled 2016-04-05: qty 1

## 2016-04-05 MED ORDER — STERILE WATER FOR INJECTION IJ SOLN
INTRAMUSCULAR | Status: AC
  Administered 2016-04-05: 1.2 mL
  Filled 2016-04-05: qty 10

## 2016-04-05 MED ORDER — ZIPRASIDONE MESYLATE 20 MG IM SOLR
20.0000 mg | Freq: Once | INTRAMUSCULAR | Status: AC
  Administered 2016-04-05: 20 mg via INTRAMUSCULAR
  Filled 2016-04-05: qty 20

## 2016-04-05 NOTE — ED Notes (Signed)
Sitter at bedside.

## 2016-04-05 NOTE — BH Assessment (Addendum)
Tele Assessment Note   Charlotte [name] is an 50 y.o. female who [facility]accompanied [facility] after being transported by her husband Peggye FormRodney Rotolo [phone]. Pt has a diagnosis of schizoaffective disorder and is currently receiving outpatient medication management with Dr. Thedore MinsMojeed Akintayo and therapy with Denyce RobertAlisa Branch, LPC. Pt is psychotic with disorganized thought process and is unable to answer questions appropriately. She says "I saw you on television. Is Rodney Lucifer or not? Was today your birthday? My family never really talked about sexual issues." Pt's medical record indicates she has a history of paranoia, hyper-religiosity and confusion.  Spoke to Pt's husband via telephone. He states on 04/02/16 Pt became more loud and argumentative. He think she has been off medications for two days. He states she was followed by ACTT team but she was doing so well the service was discontinued in October 2016. He says to his knowledge she has not verbalized suicidal thoughts. He says she has not been physically aggressive. He states Pt does not use alcohol or illicit drugs. He cannot identify any particular stressors. Pt has no legal issues. There are no firearms in the home.  Pt is dressed in hospital scrubs, alert, and not oriented. Speech is of normal rate and volume. Motor behavior appears normal. Eye contact is good. Pt's mood is pleasant and preoccupied and affect is congruent with mood. Thought process is incoherent. Pt pauses at times, looks distracted and may be responding to hallucinations.   Diagnosis: Schizoaffective Disorder  Past Medical History:  Past Medical History  Diagnosis Date  . Headache(784.0)   . Paranoid schizophrenia (HCC)   . Depression   . Schizophrenia (HCC)   . Low back pain     History reviewed. No pertinent past surgical history.  Family History:  Family History  Problem Relation Age of Onset  . Diabetes    . Hypertension    . Breast cancer       Social History:  reports that she has quit smoking. She does not have any smokeless tobacco history on file. She reports that she does not drink alcohol or use illicit drugs.  Additional Social History:  Alcohol / Drug Use Pain Medications: see PTA meds list Prescriptions: see PTA meds list Over the Counter: see PTA meds list History of alcohol / drug use?: No history of alcohol / drug abuse Longest period of sobriety (when/how long): NA  CIWA: CIWA-Ar BP: 127/83 mmHg Pulse Rate: 94 COWS:    PATIENT STRENGTHS: (choose at least two) Ability for insight Average or above average intelligence Capable of independent living MetallurgistCommunication skills Financial means General fund of knowledge Physical Health Supportive family/friends  Allergies:  Allergies  Allergen Reactions  . Divalproex Sodium Other (See Comments)    Causing tremors  . Risperidone Itching    Causes altered mental status  . Sulfonamide Derivatives Other (See Comments)    REACTION: Eye irritation    Home Medications:  (Not in a hospital admission)  OB/GYN Status:  No LMP recorded. Patient is not currently having periods (Reason: Other).  General Assessment Data Location of Assessment: [facility] ED TTS Assessment: In system Is this a Tele or Face-to-Face Assessment?: Face-to-Face Is this an Initial Assessment or a Re-assessment for this encounter?: Initial Assessment Marital status: Married Oak CreekMaiden name: Unknown Is patient pregnant?: No Pregnancy Status: No Living Arrangements: Spouse/significant other Can pt return to current living arrangement?: Yes Admission Status: Voluntary Is patient capable of signing voluntary admission?: Yes Referral Source: Self/Family/Friend [place]-SCInsurance  type: [facility] Medicare     Crisis Care Plan Living Arrangements: Spouse/significant other Legal Guardian: Other: (None) Name of Psychiatrist: Thedore Mins, MD Name of Therapist: Denyce Robert, [facility]  Education Status Is patient  currently in school?: No Current Grade: NA Highest grade of school patient has completed: NA Name of school: NA Contact person: NA  Risk to self with the past 6 months Suicidal Ideation: No Has patient been a risk to self within the past 6 months prior to admission? : No Suicidal Intent: No Has patient had any suicidal intent within the past 6 months prior to admission? : No Is patient at risk for suicide?: No Suicidal Plan?: No Has patient had any suicidal plan within the past 6 months prior to admission? : No Access to Means: No What has been your use of drugs/alcohol within the last 12 months?: Pt denies Previous Attempts/Gestures: No How many times?: 0 Other Self Harm Risks: Pt is psychotic and disorganized Triggers for Past Attempts: None known Intentional Self Injurious Behavior: None Family Suicide History: Unknown Recent stressful life event(s): Other (Comment) (None identified) Persecutory voices/beliefs?: Yes Depression: Yes Depression Symptoms: Isolating, Feeling angry/irritable Substance abuse history and/or treatment for substance abuse?: No Suicide prevention information given to non-admitted patients: Not applicable  Risk to Others within the past 6 months Homicidal Ideation: No Does patient have any lifetime risk of violence toward others beyond the six months prior to admission? : No Thoughts of Harm to Others: No Current Homicidal Intent: No Current Homicidal Plan: No Access to Homicidal Means: No Identified Victim: None History of harm to others?: No Assessment of Violence: None Noted Violent Behavior Description: No know history of violence Does patient have access to weapons?: No Criminal Charges Pending?: No Does patient have a court date: No Is patient on probation?: No  Psychosis Hallucinations: Auditory (Possible auditory hallucinations) Delusions: Grandiose, Persecutory  Mental Status Report Appearance/Hygiene: In scrubs Eye Contact:  Good Motor Activity: Unremarkable Speech: Incoherent Level of Consciousness: Alert Mood: Preoccupied Affect: Preoccupied Anxiety Level: Minimal Thought Processes: Irrelevant Judgement: Impaired Orientation: Not oriented Obsessive Compulsive Thoughts/Behaviors: None  Cognitive Functioning Concentration: Decreased Memory: Unable to Assess IQ: Average Insight: Poor Impulse Control: Fair Appetite: Fair Weight Loss: 0 Weight Gain: 0 Sleep: Decreased Total Hours of Sleep: 6 Vegetative Symptoms: None  ADLScreening [facility] Assessment Services) Patient's cognitive ability adequate to safely complete daily activities?: Yes Patient able to express need for assistance with ADLs?: Yes Independently performs ADLs?: Yes (appropriate for developmental age)  Prior Inpatient Therapy Prior Inpatient Therapy: Yes Prior Therapy Dates: 2014 Prior Therapy Facilty/Provider(s): [facility], [facility] Reason for Treatment: Schizoaffective disorder  Prior Outpatient Therapy Prior Outpatient Therapy: Yes Prior Therapy Dates: Current Prior Therapy Facilty/Provider(s): [facility] Reason for Treatment: Schizoaffective disorder Does patient have an ACCT team?: No Does patient have Intensive In-House Services?  : No Does patient have [facility] services? : No Does patient have P4CC services?: No  ADL Screening (condition at time of admission) Patient's cognitive ability adequate to safely complete daily activities?: Yes Patient able to express need for assistance with ADLs?: Yes Independently performs ADLs?: Yes (appropriate for developmental age)       Abuse/Neglect Assessment (Assessment to be complete while patient is alone) Physical Abuse: Denies Verbal Abuse: Denies Sexual Abuse: Denies Exploitation of patient/patient's resources: Denies Self-Neglect: Denies     Merchant navy officer (For Healthcare) Does patient have an advance directive?: No Would patient like  information on creating an advanced directive?: No -  patient declined information    Additional Information 1:1 In Past 12 Months?: No CIRT Risk: No Elopement Risk: No Does patient have medical clearance?: Yes     Disposition: Binnie Rail, AC at [facility], confirms adult unit is at capacity. Gave clinical report to Alberteen Sam, NP who said Pt meet criteria for inpatient psychiatric treatment. TTS will contact other facilities for placement. Notified Dr. Ross Marcus of recommendation.  Disposition Initial Assessment Completed for this Encounter: Yes Disposition of Patient: Inpatient treatment program Type of inpatient treatment program: Adult   Pamalee Leyden, [facility], [facility], [facility] Triage Specialist [phone]   Pamalee Leyden 04/05/2016 4:58 AM

## 2016-04-05 NOTE — ED Notes (Signed)
Family at bedside. 

## 2016-04-05 NOTE — ED Notes (Signed)
Pts husband took belongings home with him, including 2 blister packs of meds

## 2016-04-05 NOTE — BH Assessment (Signed)
Patient has been accepted to [facility] by Dr. Estill Cottahomas Cornwall. Patient will need to be placed under IVC before being admitted. If patient cannot be transported today then [facility] Staff would like to be notified so that they can hold the bed.  Dahlia ByesJosephine Onuoha, PMH-NP notified of acceptance. Call [phone] for report.   Davina PokeJoVea Brentyn Seehafer, LCSW Therapeutic Triage Specialist [facility] 04/05/2016 12:06 PM

## 2016-04-05 NOTE — ED Notes (Signed)
Pt presents with husband, pt rambling at times, pts husband states he was gone all week for work and returned to find her in this state. Unsure if she is taking her medication. Pt giving inappropriate answers to questions.

## 2016-04-05 NOTE — ED Notes (Addendum)
[facility] and [facility] and msg.  Also called [employer] de[employer] office.  She is unsure what time they are on for today.  She states she was trying to contact someone from the "transport" dept and will call back

## 2016-04-05 NOTE — ED Notes (Signed)
Spoke to [employer]eps[employer]Sheriff Perry, he states that he will come around 1900 to transport [facility].

## 2016-04-05 NOTE — ED Notes (Signed)
Sitter remains at beside.  Pt continues to talk to herself and at times would sing various songs.

## 2016-04-05 NOTE — Consult Note (Signed)
[facility] Face-to-Face Psychiatry Consult   Reason for Consult:  Manic euphoria, bizarre behavior Referring Physician:  EDP Patient Identification: Charlotte [name] MRN:  [id number] Principal Diagnosis: Schizoaffective disorder, bipolar type ([place]) Diagnosis:   Patient Active Problem List   Diagnosis Date Noted  . Paranoid schizophrenia ([place]) [F20.0]     Priority: High  . Schizoaffective disorder, bipolar type ([place]) [F25.0] 01/04/2012    Priority: High  . Depression [F32.9]   . HEADACHE [R51] 06/04/2008  . CANDIDIASIS OF SKIN AND NAILS [B37.2] 12/05/2007  . LOW BACK PAIN [M54.5] 09/05/2007    Total Time spent with patient: 45 minutes  Subjective:   Charlotte [name] is a 50 y.o. female patient admitted with manic and bizarre behavior.  HPI: Charlotte [name] is a 50 y.o. female with long history paranoid schizophrenia, bipolar disorder and anxiety. Patient was brought to [facility] ED yesterday by husband due to erratic behavior and manic euphoria after she did not take her medications for few days. Patient is a poor historian, but husband reports  that the pt has not been taking her medication for at least a few days this week because he was out of town and unable to monitor her. Patient reports that she has not slept in 4 days, she endorses racing thoughts and high energy level. Patient is hyper verbal, acting bizarre, restless with pressured speech. She denies suicidal/Homicidal thoughts, drugs or alcohol abuse.  Past Psychiatric History: Generalized anxiety disorder  Risk to Self: Suicidal Ideation: No Suicidal Intent: No Is patient at risk for suicide?: No Suicidal Plan?: No Access to Means: No What has been your use of drugs/alcohol within the last 12 months?: Pt denies How many times?: 0 Other Self Harm Risks: Pt is psychotic and disorganized Triggers for Past Attempts: None known Intentional Self Injurious Behavior: None Risk to Others: Homicidal Ideation: No Thoughts of Harm to  Others: No Current Homicidal Intent: No Current Homicidal Plan: No Access to Homicidal Means: No Identified Victim: None History of harm to others?: No Assessment of Violence: None Noted Violent Behavior Description: No know history of violence Does patient have access to weapons?: No Criminal Charges Pending?: No Does patient have a court date: No Prior Inpatient Therapy: Prior Inpatient Therapy: Yes Prior Therapy Dates: 2014 Prior Therapy Facilty/Provider(s): [facility], [facility] Reason for Treatment: Schizoaffective disorder Prior Outpatient Therapy: Prior Outpatient Therapy: Yes Prior Therapy Dates: Current Prior Therapy Facilty/Provider(s): [facility] Reason for Treatment: Schizoaffective disorder Does patient have an ACCT team?: No Does patient have Intensive In-House Services?  : No Does patient have [facility] services? : No Does patient have P4CC services?: No  Past Medical History:  Past Medical History  Diagnosis Date  . Headache(784.0)   . Paranoid schizophrenia ([place])   . Depression   . Schizophrenia ([place])   . Low back pain    History reviewed. No pertinent past surgical history. Family History:  Family History  Problem Relation Age of Onset  . Diabetes    . Hypertension    . Breast cancer     Family Psychiatric  History:  Social History:  History  Alcohol Use No     History  Drug Use No    Social History   Social History  . Marital Status: Married    Spouse Name: N/A  . Number of Children: N/A  . Years of Education: N/A   Social History Main Topics  . Smoking status: Former Smoker -- 0.01 packs/day for 5 years  .  Smokeless tobacco: None  . Alcohol Use: No  . Drug Use: No  . Sexual Activity: Yes    Birth Control/ Protection: None   Other Topics Concern  . None   Social History Narrative   Additional Social History:    Allergies:   Allergies  Allergen Reactions  . Divalproex Sodium Other (See Comments)     Causing tremors  . Risperidone Itching    Causes altered mental status  . Sulfonamide Derivatives Other (See Comments)    REACTION: Eye irritation    Labs:  Results for orders placed or performed during the hospital encounter of 04/05/16 (from the past 48 hour(s))  Comprehensive metabolic panel     Status: Abnormal   Collection Time: 04/05/16  3:13 AM  Result Value Ref Range   Sodium 144 135 - 145 mmol/L   Potassium 3.1 (L) 3.5 - 5.1 mmol/L   Chloride 108 101 - 111 mmol/L   CO2 26 22 - 32 mmol/L   Glucose, Bld 110 (H) 65 - 99 mg/dL   BUN 9 6 - 20 mg/dL   Creatinine, Ser 1.09 (H) 0.44 - 1.00 mg/dL   Calcium 9.8 8.9 - 10.3 mg/dL   Total Protein 7.8 6.5 - 8.1 g/dL   Albumin 4.8 3.5 - 5.0 g/dL   AST 23 15 - 41 U/L   ALT 20 14 - 54 U/L   Alkaline Phosphatase 68 38 - 126 U/L   Total Bilirubin 0.6 0.3 - 1.2 mg/dL   GFR calc non Af Amer 59 (L) >60 mL/min   GFR calc Af Amer >60 >60 mL/min    Comment: (NOTE) The eGFR has been calculated using the CKD EPI equation. This calculation has not been validated in all clinical situations. eGFR's persistently <60 mL/min signify possible Chronic Kidney Disease.    Anion gap 10 5 - 15  Ethanol (ETOH)     Status: None   Collection Time: 04/05/16  3:13 AM  Result Value Ref Range   Alcohol, Ethyl (B) <5 <5 mg/dL    Comment:        LOWEST DETECTABLE LIMIT FOR SERUM ALCOHOL IS 5 mg/dL FOR MEDICAL PURPOSES ONLY   Salicylate level     Status: None   Collection Time: 04/05/16  3:13 AM  Result Value Ref Range   Salicylate Lvl <8.2 2.8 - 30.0 mg/dL  Acetaminophen level     Status: Abnormal   Collection Time: 04/05/16  3:13 AM  Result Value Ref Range   Acetaminophen (Tylenol), Serum <10 (L) 10 - 30 ug/mL    Comment:        THERAPEUTIC CONCENTRATIONS VARY SIGNIFICANTLY. A RANGE OF 10-30 ug/mL MAY BE AN EFFECTIVE CONCENTRATION FOR MANY PATIENTS. HOWEVER, SOME ARE BEST TREATED AT CONCENTRATIONS OUTSIDE THIS RANGE. ACETAMINOPHEN  CONCENTRATIONS >150 ug/mL AT 4 HOURS AFTER INGESTION AND >50 ug/mL AT 12 HOURS AFTER INGESTION ARE OFTEN ASSOCIATED WITH TOXIC REACTIONS.   CBC     Status: None   Collection Time: 04/05/16  3:13 AM  Result Value Ref Range   WBC 8.2 4.0 - 10.5 K/uL   RBC 4.84 3.87 - 5.11 MIL/uL   Hemoglobin 13.8 12.0 - 15.0 g/dL   HCT 40.8 36.0 - 46.0 %   MCV 84.3 78.0 - 100.0 fL   MCH 28.5 26.0 - 34.0 pg   MCHC 33.8 30.0 - 36.0 g/dL   RDW 13.7 11.5 - 15.5 %   Platelets 261 150 - 400 K/uL  Differential     Status:  None   Collection Time: 04/05/16  3:13 AM  Result Value Ref Range   Neutrophils Relative % 69 %   Neutro Abs 5.7 1.7 - 7.7 K/uL   Lymphocytes Relative 22 %   Lymphs Abs 1.8 0.7 - 4.0 K/uL   Monocytes Relative 8 %   Monocytes Absolute 0.6 0.1 - 1.0 K/uL   Eosinophils Relative 1 %   Eosinophils Absolute 0.1 0.0 - 0.7 K/uL   Basophils Relative 0 %   Basophils Absolute 0.0 0.0 - 0.1 K/uL    Current Facility-Administered Medications  Medication Dose Route Frequency Provider Last Rate Last Dose  . benztropine (COGENTIN) tablet 0.5 mg  0.5 mg Oral BID Corena Pilgrim, MD      . cloZAPine (CLOZARIL) tablet 100 mg  100 mg Oral BID Merryl Hacker, MD      . diphenhydrAMINE (BENADRYL) injection 50 mg  50 mg Intramuscular Once Leomia Blake, MD      . LORazepam (ATIVAN) injection 2 mg  2 mg Intramuscular Once Matthewjames Petrasek, MD      . venlafaxine XR (EFFEXOR-XR) 24 hr capsule 75 mg  75 mg Oral Q breakfast Merryl Hacker, MD      . ziprasidone (GEODON) injection 20 mg  20 mg Intramuscular Once Corena Pilgrim, MD       Current Outpatient Prescriptions  Medication Sig Dispense Refill  . benztropine (COGENTIN) 0.5 MG tablet Take 0.5 mg by mouth 2 (two) times daily.  0  . cloZAPine (CLOZARIL) 100 MG tablet Take 100 mg by mouth 2 (two) times daily.     Marland Kitchen venlafaxine XR (EFFEXOR-XR) 75 MG 24 hr capsule Take 75 mg by mouth daily.  0    Musculoskeletal: Strength & Muscle Tone: within  normal limits Gait & Station: normal Patient leans: N/A  Psychiatric Specialty Exam: Review of Systems  Constitutional: Negative.   HENT: Negative.   Eyes: Negative.   Respiratory: Negative.   Cardiovascular: Negative.   Gastrointestinal: Negative.   Genitourinary: Negative.   Musculoskeletal: Negative.   Skin: Negative.   Neurological: Negative.   Endo/Heme/Allergies: Negative.   Psychiatric/Behavioral: The patient is nervous/anxious and has insomnia.     Blood pressure 125/94, pulse 97, temperature 98.1 F (36.7 C), temperature source Oral, resp. rate 19, height '5\' 7"'  (1.702 m), weight 86.183 kg (190 lb), SpO2 100 %.Body mass index is 29.75 kg/(m^2).  General Appearance: Fairly Groomed  Engineer, water::  Minimal  Speech:  Pressured  Volume:  Increased  Mood:  Euphoric  Affect:  Congruent  Thought Process:  Disorganized  Orientation:  Full (Time, Place, and Person)  Thought Content:  Negative  Suicidal Thoughts:  No  Homicidal Thoughts:  No  Memory:  Immediate;   Fair Recent;   Fair Remote;   Fair  Judgement:  Impaired  Insight:  Lacking  Psychomotor Activity:  Increased  Concentration:  Poor  Recall:  Good  Fund of Knowledge:Fair  Language: Fair  Akathisia:  No  Handed:  Right  AIMS (if indicated):     Assets:  Social Support  ADL's:  Intact  Cognition: WNL  Sleep:   poor   Treatment Plan Summary: Daily contact with patient to assess and evaluate symptoms and progress in treatment and Medication management  -Continue Benztropine 0.18m bid for EPS prevention/salivation. -Continue Clozaril 1025mbid for mood/paranoid disorder -Continue Effexor XL 758mo daily for anxiety   Disposition: Recommend psychiatric Inpatient admission when medically cleared. Supportive therapy provided about ongoing stressors.  Amberrose Friebel,  MD 04/05/2016 11:18 AM

## 2016-04-05 NOTE — ED Notes (Signed)
Sheriff Dept contacted for transport-left a msg

## 2016-04-05 NOTE — Clinical Social Work Note (Signed)
CSW faxed IVC paperwork to magistrates office and confirmed with Avelino Leedsmagistrate jacob that it was received.  Elray Buba.Zailyn Rowser, LCSW [facility] Clinical Social Worker - Weekend Coverage cell #: [phone]

## 2016-04-05 NOTE — ED Notes (Signed)
Pt's spouse at bedside.  Josephine NP reported that [facility].  Will be IVC'd and will need Sheriff to transport.

## 2016-04-05 NOTE — ED Notes (Signed)
Pt has been seen by security. Pt's husband has jewelry and other items.

## 2016-04-05 NOTE — ED Notes (Signed)
Pt continues to have 2 sided conversation in room, singing at times

## 2016-04-05 NOTE — ED Notes (Signed)
Pt sitting cross-legged in chair with sitter at bedside.  Talking to herself.  No complaints at this time.

## 2016-04-05 NOTE — ED Provider Notes (Signed)
CSN: [id number]     Arrival date & time 04/04/16  2342 History  By signing my name below, I, Charlotte [name], attest that this documentation has been prepared under the direction and in the presence of Shon Baton, MD. Electronically Signed: Doreatha [name], ED Scribe. 04/05/2016. 3:37 AM.    Chief Complaint  Patient presents with  . Manic Behavior   The history is provided by the patient. No language interpreter was used.   HPI Comments: Charlotte [name] is a 50 y.o. female with h/o paranoid schizophrenia on benztropine and clozapine, depression who presents to the Emergency Department d/t erratic and manic behavior after missed doses of medications. Per husband, he believes that the pt has not been taking her medication for at least a few days this week. He states he has been out of town this week and has been unable to monitor her doses. He reports she has not slept in 3 days. When asked if she is taking her medications, she answers, "yes and no". Pt denies any pain. Denies drug or alcohol use.   Past Medical History  Diagnosis Date  . Headache(784.0)   . Paranoid schizophrenia (HCC)   . Depression   . Schizophrenia (HCC)   . Low back pain    History reviewed. No pertinent past surgical history. Family History  Problem Relation Age of Onset  . Diabetes    . Hypertension    . Breast cancer     Social History  Substance Use Topics  . Smoking status: Former Smoker -- 0.01 packs/day for 5 years  . Smokeless tobacco: None  . Alcohol Use: No   OB History    No data available     Review of Systems  Musculoskeletal: Negative for myalgias and arthralgias.  Psychiatric/Behavioral: Positive for behavioral problems and sleep disturbance. The patient is hyperactive.   All other systems reviewed and are negative.  Allergies  Divalproex sodium; Risperidone; and Sulfonamide derivatives  Home Medications   Prior to Admission medications   Medication Sig Start Date End Date Taking?  Authorizing Provider  benztropine (COGENTIN) 0.5 MG tablet Take 0.5 mg by mouth 2 (two) times daily. 03/20/16  Yes Historical Provider, MD  cloZAPine (CLOZARIL) 100 MG tablet Take 100 mg by mouth 2 (two) times daily.    Yes Historical Provider, MD  venlafaxine XR (EFFEXOR-XR) 75 MG 24 hr capsule Take 75 mg by mouth daily. 03/20/16  Yes Historical Provider, MD   BP 127/83 mmHg  Pulse 94  Temp(Src) 98.4 F (36.9 C) (Oral)  Resp 15  Ht  (1.702 m)  Wt 190 lb (86.183 kg)  BMI 29.75 kg/m2  SpO2 100% Physical Exam  Constitutional: She is oriented to person, place, and time. She appears well-developed and well-nourished. No distress.  Anxious and fidgety  HENT:  Head: Normocephalic and atraumatic.  Cardiovascular: Normal rate, regular rhythm and normal heart sounds.   No murmur heard. Pulmonary/Chest: Effort normal and breath sounds normal. No respiratory distress. She has no wheezes.  Abdominal: Soft. Bowel sounds are normal. She exhibits no distension. There is no tenderness.  Neurological: She is alert and oriented to person, place, and time.  Skin: Skin is warm and dry.  Psychiatric:  Tangential, flight of ideas, hyperactive    ED Course  Procedures (including critical care time) DIAGNOSTIC STUDIES: Oxygen Saturation is 100% on RA, normal by my interpretation.    COORDINATION OF CARE: 3:37 AM Discussed treatment plan with pt at bedside which includes lab work  and pt agreed to plan.   Labs Review Labs Reviewed  COMPREHENSIVE METABOLIC PANEL - Abnormal; Notable for the following:    Potassium 3.1 (*)    Glucose, Bld 110 (*)    Creatinine, Ser 1.09 (*)    GFR calc non Af Amer 59 (*)    All other components within normal limits  ACETAMINOPHEN LEVEL - Abnormal; Notable for the following:    Acetaminophen (Tylenol), Serum <10 (*)    All other components within normal limits  ETHANOL  SALICYLATE LEVEL  CBC  URINE RAPID DRUG SCREEN, HOSP PERFORMED    Imaging Review No  results found. I have personally reviewed and evaluated these images and lab results as part of my medical decision-making.   EKG Interpretation   Date/Time:  Sunday April 05 2016 04:22:09 EDT Ventricular Rate:  94 PR Interval:  151 QRS Duration: 116 QT Interval:  375 QTC Calculation: 469 R Axis:   9 Text Interpretation:  Sinus rhythm Nonspecific intraventricular conduction  delay Confirmed by HORTON  MD, COURTNEY ([id number]) on 04/05/2016 5:03:42 AM      MDM   Final diagnoses:  Bipolar I disorder with mania (HCC)   Patient appears to be manic.  Not taking medications.  Screening labwork and TTS consult pending.    Patient medically clear.  Psych hold orders placed.  Meets inpatient.  Seeking placement.   I personally performed the services described in this documentation, which was scribed in my presence. The recorded information has been reviewed and is accurate.  Shon Batonourtney F Horton, MD 04/05/16 [phone]

## 2016-04-05 NOTE — Progress Notes (Signed)
Disposition CSW completed patient referrals [facility] the [facility]  CSW will continue to follow patient for placement needs.  Seward SpeckLeo Kanon Colunga [facility]oyomeCSW,LCAS [facility] Disposition CSW [phone]

## 2020-01-16 ENCOUNTER — Ambulatory Visit: Payer: Medicare Other | Attending: Internal Medicine

## 2020-01-16 DIAGNOSIS — Z20822 Contact with and (suspected) exposure to covid-19: Secondary | ICD-10-CM

## 2020-01-17 LAB — NOVEL CORONAVIRUS, NAA: SARS-CoV-2, NAA: NOT DETECTED

## 2022-07-24 ENCOUNTER — Encounter (HOSPITAL_COMMUNITY): Payer: Self-pay

## 2022-07-24 ENCOUNTER — Observation Stay (HOSPITAL_BASED_OUTPATIENT_CLINIC_OR_DEPARTMENT_OTHER)
Admission: EM | Admit: 2022-07-24 | Discharge: 2022-07-25 | Disposition: A | Discharge status: Home / Self Care | Payer: Medicare Other | Attending: Internal Medicine | Admitting: Internal Medicine

## 2022-07-24 ENCOUNTER — Emergency Department (HOSPITAL_BASED_OUTPATIENT_CLINIC_OR_DEPARTMENT_OTHER): Payer: Medicare Other

## 2022-07-24 ENCOUNTER — Other Ambulatory Visit: Payer: Self-pay

## 2022-07-24 ENCOUNTER — Encounter (HOSPITAL_BASED_OUTPATIENT_CLINIC_OR_DEPARTMENT_OTHER): Payer: Self-pay | Admitting: Obstetrics and Gynecology

## 2022-07-24 DIAGNOSIS — Z87891 Personal history of nicotine dependence: Secondary | ICD-10-CM | POA: Diagnosis not present

## 2022-07-24 DIAGNOSIS — D72829 Elevated white blood cell count, unspecified: Secondary | ICD-10-CM | POA: Insufficient documentation

## 2022-07-24 DIAGNOSIS — R9431 Abnormal electrocardiogram [ECG] [EKG]: Secondary | ICD-10-CM | POA: Insufficient documentation

## 2022-07-24 DIAGNOSIS — R2689 Other abnormalities of gait and mobility: Secondary | ICD-10-CM | POA: Diagnosis not present

## 2022-07-24 DIAGNOSIS — R55 Syncope and collapse: Secondary | ICD-10-CM | POA: Diagnosis present

## 2022-07-24 DIAGNOSIS — E876 Hypokalemia: Secondary | ICD-10-CM | POA: Diagnosis not present

## 2022-07-24 DIAGNOSIS — Z79899 Other long term (current) drug therapy: Secondary | ICD-10-CM | POA: Diagnosis not present

## 2022-07-24 DIAGNOSIS — Z87898 Personal history of other specified conditions: Secondary | ICD-10-CM

## 2022-07-24 HISTORY — DX: Personal history of other specified conditions: Z87.898

## 2022-07-24 LAB — COMPREHENSIVE METABOLIC PANEL
ALT: 12 U/L (ref 0–44)
AST: 17 U/L (ref 15–41)
Albumin: 4.7 g/dL (ref 3.5–5.0)
Alkaline Phosphatase: 86 U/L (ref 38–126)
Anion gap: 14 (ref 5–15)
BUN: 14 mg/dL (ref 6–20)
CO2: 25 mmol/L (ref 22–32)
Calcium: 9.9 mg/dL (ref 8.9–10.3)
Chloride: 102 mmol/L (ref 98–111)
Creatinine, Ser: 1.32 mg/dL — ABNORMAL HIGH (ref 0.44–1.00)
GFR, Estimated: 48 mL/min — ABNORMAL LOW (ref >60)
Glucose, Bld: 120 mg/dL — ABNORMAL HIGH (ref 70–99)
Potassium: 3.6 mmol/L (ref 3.5–5.1)
Sodium: 141 mmol/L (ref 135–145)
Total Bilirubin: 0.6 mg/dL (ref 0.3–1.2)
Total Protein: 7.4 g/dL (ref 6.5–8.1)

## 2022-07-24 LAB — URINALYSIS, ROUTINE W REFLEX MICROSCOPIC
Bilirubin Urine: NEGATIVE
Glucose, UA: NEGATIVE mg/dL
Ketones, ur: NEGATIVE mg/dL
Leukocytes,Ua: NEGATIVE
Nitrite: NEGATIVE
Protein, ur: 30 mg/dL — AB
Specific Gravity, Urine: 1.023 (ref 1.005–1.030)
pH: 5.5 (ref 5.0–8.0)

## 2022-07-24 LAB — TROPONIN I (HIGH SENSITIVITY)
Troponin I (High Sensitivity): 5 ng/L (ref <18)
Troponin I (High Sensitivity): 5 ng/L (ref <18)

## 2022-07-24 LAB — CBC
HCT: 46.7 % — ABNORMAL HIGH (ref 36.0–46.0)
Hemoglobin: 15.6 g/dL — ABNORMAL HIGH (ref 12.0–15.0)
MCH: 29.1 pg (ref 26.0–34.0)
MCHC: 33.4 g/dL (ref 30.0–36.0)
MCV: 87 fL (ref 80.0–100.0)
Platelets: 213 10*3/uL (ref 150–400)
RBC: 5.37 MIL/uL — ABNORMAL HIGH (ref 3.87–5.11)
RDW: 13 % (ref 11.5–15.5)
WBC: 14.2 10*3/uL — ABNORMAL HIGH (ref 4.0–10.5)
nRBC: 0 % (ref 0.0–0.2)

## 2022-07-24 LAB — PREGNANCY, URINE: Preg Test, Ur: NEGATIVE

## 2022-07-24 LAB — LIPASE, BLOOD: Lipase: 20 U/L (ref 11–51)

## 2022-07-24 LAB — MAGNESIUM: Magnesium: 2.3 mg/dL (ref 1.7–2.4)

## 2022-07-24 LAB — D-DIMER, QUANTITATIVE: D-Dimer, Quant: 3.68 ug/mL-FEU — ABNORMAL HIGH (ref 0.00–0.50)

## 2022-07-24 MED ORDER — ACETAMINOPHEN 325 MG PO TABS: 650.0000 mg | ORAL_TABLET | Freq: Four times a day (QID) | ORAL | Status: DC | PRN

## 2022-07-24 MED ORDER — ONDANSETRON HCL 4 MG/2ML IJ SOLN
4.0000 mg | Freq: Four times a day (QID) | INTRAMUSCULAR | Status: DC | PRN
  Administered 2022-07-25: 4 mg via INTRAVENOUS
  Filled 2022-07-24: qty 2

## 2022-07-24 MED ORDER — ACETAMINOPHEN 650 MG RE SUPP: 650.0000 mg | Freq: Four times a day (QID) | RECTAL | Status: DC | PRN

## 2022-07-24 MED ORDER — SODIUM CHLORIDE 0.9% FLUSH
3.0000 mL | Freq: Two times a day (BID) | INTRAVENOUS | Status: DC
Start: 2022-07-24 — End: 2022-07-26
  Administered 2022-07-24: 3 mL via INTRAVENOUS

## 2022-07-24 MED ORDER — IOHEXOL 350 MG/ML SOLN
100.0000 mL | Freq: Once | INTRAVENOUS | Status: AC | PRN
  Administered 2022-07-24: 61 mL via INTRAVENOUS

## 2022-07-24 MED ORDER — LORAZEPAM 0.5 MG PO TABS: 0.5000 mg | ORAL_TABLET | Freq: Once | ORAL | Status: DC

## 2022-07-24 MED ORDER — CLONAZEPAM 0.5 MG PO TABS
0.5000 mg | ORAL_TABLET | Freq: Every day | ORAL | Status: DC
  Administered 2022-07-24: 0.5 mg via ORAL
  Filled 2022-07-24: qty 1

## 2022-07-24 MED ORDER — ONDANSETRON HCL 4 MG PO TABS: 4.0000 mg | ORAL_TABLET | Freq: Four times a day (QID) | ORAL | Status: DC | PRN

## 2022-07-24 MED ORDER — SODIUM CHLORIDE 0.9 % IV BOLUS
1000.0000 mL | Freq: Once | INTRAVENOUS | Status: AC
  Administered 2022-07-24: 1000 mL via INTRAVENOUS

## 2022-07-24 MED ORDER — LACTATED RINGERS IV BOLUS
1000.0000 mL | Freq: Once | INTRAVENOUS | Status: AC
  Administered 2022-07-24: 1000 mL via INTRAVENOUS

## 2022-07-24 MED ORDER — SODIUM CHLORIDE 0.9 % IV SOLN: INTRAVENOUS | Status: DC

## 2022-07-24 MED ORDER — HEPARIN SODIUM (PORCINE) 5000 UNIT/ML IJ SOLN
5000.0000 [IU] | Freq: Three times a day (TID) | INTRAMUSCULAR | Status: DC
  Administered 2022-07-24 – 2022-07-25 (×2): 5000 [IU] via SUBCUTANEOUS
  Filled 2022-07-24 (×2): qty 1

## 2022-07-24 NOTE — H&P (Signed)
History and Physical    Charlotte [name] YQM:[id number] DOB: 04-11-1966 DOA: 07/24/2022  PCP: Juluis Rainier, MD (Inactive)  Patient coming from: [facility]  I have personally briefly reviewed patient's old medical records in [name]  Chief Complaint: syncope with fall x 2   HPI: Charlotte [name] is a 56 y.o. female with medical history significant of schizophrenia, depression, HA who presents to [facility] after 2 episodes of syncope with fall at home. Per patient the initial event occur after using commode, the second she has no recollection of events. She states she remembers waking up being surrounded by her family.  Per notes there was no bladder or bowel incontinence or seizure like activity.  Patient noted no chest pain / palpitations or prodrome prior to event. She notes she was in her usual state of health prior to event. She does state that for a few days prior she was working outside in the heat and did not have much fluid intake.Currently she denies history of sz, prior syncope, chest pain , n/v/dysuria/ diarrhea, fever/chills/ cough or uri signs and symptoms. Of note she did have episode of nausea and vomiting s/p fall but this resolved prior to admit to ED.   ED Course:  Vitlas:afeb, bp 98/78-79/42(standing), hr 113, rr 18 sat 100% on ra  Labs: Wbc 14.2/heb 15.6(13.8)  Lipase 20 NA 141, K 3.6, CL 102, CO2 25, gly 120 , Cr 1.32, 1.09 Calcium 9.9 Mag 2.3  -d-dimer :3.68 CTH:1. No evidence of acute intracranial abnormality. 2. No evidence of acute fracture or traumatic malalignment in the cervical spine. Lower ext Ultrasound: neg dvt CTA of chest: neg PE UA: neg EKG : sinus no st twave changes Tx LR Review of Systems: As per HPI otherwise 10 point review of systems negative.   Past Medical History:  Diagnosis Date   Depression    Headache(784.0)    Low back pain    Paranoid schizophrenia (HCC)    Schizophrenia (HCC)     History reviewed. No pertinent  surgical history.   reports that she has quit smoking. Her smoking use included cigarettes. She has a 0.05 pack-year smoking history. She does not have any smokeless tobacco history on file. She reports that she does not drink alcohol and does not use drugs.  Allergies  Allergen Reactions   Divalproex Sodium Other (See Comments)    Causing tremors   Risperidone Itching    Causes altered mental status   Sulfonamide Derivatives Other (See Comments)    REACTION: Eye irritation    Family History  Problem Relation Age of Onset   Diabetes Other    Hypertension Other    Breast cancer Other    Prior to Admission medications   Medication Sig Start Date End Date Taking? Authorizing Provider  benztropine (COGENTIN) 0.5 MG tablet Take 0.5 mg by mouth 2 (two) times daily. 03/20/16   [provider]  clonazePAM (KLONOPIN) 0.5 MG tablet Take 0.5 mg by mouth at bedtime. 06/24/22   [provider]  cloZAPine (CLOZARIL) 100 MG tablet Take 100 mg by mouth 2 (two) times daily.     [provider]  venlafaxine XR (EFFEXOR-XR) 75 MG 24 hr capsule Take 75 mg by mouth daily. 03/20/16   [provider]    Physical Exam: Vitals:   07/24/22 1629 07/24/22 1630 07/24/22 1631 07/24/22 1800  BP: 119/88 119/88  (!) 120/95  Pulse: 96 95  (!) 102  Resp: 20 (!) 22  14  Temp: 98.6 F (37 C)  98.4 F (36.9 C)   TempSrc:   Oral   SpO2: 100% 100%  100%     Vitals:   07/24/22 1629 07/24/22 1630 07/24/22 1631 07/24/22 1800  BP: 119/88 119/88  (!) 120/95  Pulse: 96 95  (!) 102  Resp: 20 (!) 22  14  Temp: 98.6 F (37 C)  98.4 F (36.9 C)   TempSrc:   Oral   SpO2: 100% 100%  100%  Constitutional: NAD, calm, comfortable Eyes: PERRL, lids and conjunctivae normal ENMT: Mucous membranes are moist. Posterior pharynx clear of any exudate or lesions.Normal dentition.  Neck: normal, supple, no masses, no thyromegaly Respiratory: clear to auscultation bilaterally, no wheezing, no  crackles. Normal respiratory effort. No accessory muscle use.  Cardiovascular: Regular rate and rhythm, no murmurs / rubs / gallops. No extremity edema. 2+ pedal pulses.   Abdomen: no tenderness, no masses palpated. No hepatosplenomegaly. Bowel sounds positive.  Musculoskeletal: no clubbing / cyanosis. No joint deformity upper and lower extremities. Good ROM, no contractures. Normal muscle tone.  Skin: no rashes, lesions, ulcers. No induration Neurologic: CN 2-12 grossly intact. Sensation intact,  Strength 5/5 in all 4.  Psychiatric: Normal judgment and insight. Alert and oriented x 3. Normal mood.    Labs on Admission: I have personally reviewed following labs and imaging studies  CBC: Recent Labs  Lab 07/24/22 1220  WBC 14.2*  HGB 15.6*  HCT 46.7*  MCV 87.0  PLT 213   Basic Metabolic Panel: Recent Labs  Lab 07/24/22 1220  NA 141  K 3.6  CL 102  CO2 25  GLUCOSE 120*  BUN 14  CREATININE 1.32*  CALCIUM 9.9  MG 2.3   GFR: CrCl cannot be calculated (Unknown ideal weight.). Liver Function Tests: Recent Labs  Lab 07/24/22 1220  AST 17  ALT 12  ALKPHOS 86  BILITOT 0.6  PROT 7.4  ALBUMIN 4.7   Recent Labs  Lab 07/24/22 1220  LIPASE 20   No results for input(s): "AMMONIA" in the last 168 hours. Coagulation Profile: No results for input(s): "INR", "PROTIME" in the last 168 hours. Cardiac Enzymes: No results for input(s): "CKTOTAL", "CKMB", "CKMBINDEX", "TROPONINI" in the last 168 hours. BNP (last 3 results) No results for input(s): "PROBNP" in the last 8760 hours. HbA1C: No results for input(s): "HGBA1C" in the last 72 hours. CBG: No results for input(s): "GLUCAP" in the last 168 hours. Lipid Profile: No results for input(s): "CHOL", "HDL", "LDLCALC", "TRIG", "CHOLHDL", "LDLDIRECT" in the last 72 hours. Thyroid Function Tests: No results for input(s): "TSH", "T4TOTAL", "FREET4", "T3FREE", "THYROIDAB" in the last 72 hours. Anemia Panel: No results for  input(s): "VITAMINB12", "FOLATE", "FERRITIN", "TIBC", "IRON", "RETICCTPCT" in the last 72 hours. Urine analysis:    Component Value Date/Time   COLORURINE YELLOW 07/24/2022 1533   APPEARANCEUR CLEAR 07/24/2022 1533   LABSPEC 1.023 07/24/2022 1533   PHURINE 5.5 07/24/2022 1533   GLUCOSEU NEGATIVE 07/24/2022 1533   HGBUR TRACE (A) 07/24/2022 1533   BILIRUBINUR NEGATIVE 07/24/2022 1533   KETONESUR NEGATIVE 07/24/2022 1533   PROTEINUR 30 (A) 07/24/2022 1533   UROBILINOGEN 0.2 03/05/2013 1709   NITRITE NEGATIVE 07/24/2022 1533   LEUKOCYTESUR NEGATIVE 07/24/2022 1533    Radiological Exams on Admission: CT Angio Chest PE W and/or Wo Contrast  Result Date: 07/24/2022 CLINICAL DATA:  Pulmonary embolism suspected. Positive D-dimer. Patient fell last night loss of consciousness. EXAM: CT ANGIOGRAPHY CHEST WITH CONTRAST TECHNIQUE: Multidetector CT imaging of the chest was  performed using the standard protocol during bolus administration of intravenous contrast. Multiplanar CT image reconstructions and MIPs were obtained to evaluate the vascular anatomy. RADIATION DOSE REDUCTION: This exam was performed according to the departmental dose-optimization program which includes automated exposure control, adjustment of the mA and/or kV according to patient size and/or use of iterative reconstruction technique. CONTRAST:  56mL OMNIPAQUE IOHEXOL 350 MG/ML SOLN COMPARISON:  None FINDINGS: Cardiovascular: Satisfactory opacification of the pulmonary arteries to the segmental level. No evidence of pulmonary embolism. Normal heart size. No pericardial effusion. Mediastinum/Nodes: No enlarged mediastinal, hilar, or axillary lymph nodes. Thyroid gland, trachea, and esophagus demonstrate no significant findings. Lungs/Pleura: Lungs are clear. No pleural effusion or pneumothorax. Upper Abdomen: Small hiatal hernia.  No acute abnormality. Musculoskeletal: No chest wall abnormality. No acute or significant osseous findings.  Review of the MIP images confirms the above findings. IMPRESSION: 1.  No evidence of pulmonary embolism. 2.  No acute cardiopulmonary process. Electronically Signed   By: Larose Hires D.O.   On: 07/24/2022 15:58   US Venous Img Lower Bilateral  Result Date: 07/24/2022 CLINICAL DATA:  Fall, injury, left ankle pain, chest pain and elevated D-dimer. EXAM: BILATERAL LOWER EXTREMITY VENOUS DOPPLER ULTRASOUND TECHNIQUE: Gray-scale sonography with graded compression, as well as color Doppler and duplex ultrasound were performed to evaluate the lower extremity deep venous systems from the level of the common femoral vein and including the common femoral, femoral, profunda femoral, popliteal and calf veins including the posterior tibial, peroneal and gastrocnemius veins when visible. The superficial great saphenous vein was also interrogated. Spectral Doppler was utilized to evaluate flow at rest and with distal augmentation maneuvers in the common femoral, femoral and popliteal veins. COMPARISON:  None Available. FINDINGS: RIGHT LOWER EXTREMITY Common Femoral Vein: No evidence of thrombus. Normal compressibility, respiratory phasicity and response to augmentation. Saphenofemoral Junction: No evidence of thrombus. Normal compressibility and flow on color Doppler imaging. Profunda Femoral Vein: No evidence of thrombus. Normal compressibility and flow on color Doppler imaging. Femoral Vein: No evidence of thrombus. Normal compressibility, respiratory phasicity and response to augmentation. Popliteal Vein: No evidence of thrombus. Normal compressibility, respiratory phasicity and response to augmentation. Calf Veins: No evidence of thrombus. Normal compressibility and flow on color Doppler imaging. Superficial Great Saphenous Vein: No evidence of thrombus. Normal compressibility. Venous Reflux:  None. Other Findings: No evidence of superficial thrombophlebitis or abnormal fluid collection. LEFT LOWER EXTREMITY Common Femoral  Vein: No evidence of thrombus. Normal compressibility, respiratory phasicity and response to augmentation. Saphenofemoral Junction: No evidence of thrombus. Normal compressibility and flow on color Doppler imaging. Profunda Femoral Vein: No evidence of thrombus. Normal compressibility and flow on color Doppler imaging. Femoral Vein: No evidence of thrombus. Normal compressibility, respiratory phasicity and response to augmentation. Popliteal Vein: No evidence of thrombus. Normal compressibility, respiratory phasicity and response to augmentation. Calf Veins: No evidence of thrombus. Normal compressibility and flow on color Doppler imaging. Superficial Great Saphenous Vein: No evidence of thrombus. Normal compressibility. Venous Reflux:  None. Other Findings: No evidence of superficial thrombophlebitis or abnormal fluid collection. IMPRESSION: No evidence of deep venous thrombosis in either lower extremity. Electronically Signed   By: Irish Lack M.D.   On: 07/24/2022 15:27   CT Head Wo Contrast  Result Date: 07/24/2022 CLINICAL DATA:  Head trauma, moderate-severe; Polytrauma, blunt EXAM: CT HEAD WITHOUT CONTRAST CT CERVICAL SPINE WITHOUT CONTRAST TECHNIQUE: Multidetector CT imaging of the head and cervical spine was performed following the standard protocol without intravenous contrast. Multiplanar CT  image reconstructions of the cervical spine were also generated. RADIATION DOSE REDUCTION: This exam was performed according to the departmental dose-optimization program which includes automated exposure control, adjustment of the mA and/or kV according to patient size and/or use of iterative reconstruction technique. COMPARISON:  None Available. FINDINGS: CT HEAD FINDINGS Brain: No evidence of acute infarction, hemorrhage, hydrocephalus, extra-axial collection or mass lesion/mass effect. Vascular: No hyperdense vessel identified. Skull: No acute fracture. Sinuses/Orbits: Visualized sinuses are clear. No acute  orbital findings. Other: No mastoid effusions. CT CERVICAL SPINE FINDINGS Alignment: Reversal of the normal cervical lordosis. Skull base and vertebrae: Vertebral body heights are maintained. No evidence of acute fracture. Soft tissues and spinal canal: No prevertebral fluid or swelling. No visible canal hematoma. Disc levels:  No significant focal bony degenerative change. Upper chest: Visualized lung apices are clear. IMPRESSION: 1. No evidence of acute intracranial abnormality. 2. No evidence of acute fracture or traumatic malalignment in the cervical spine. Electronically Signed   By: Feliberto Harts M.D.   On: 07/24/2022 14:08   CT Cervical Spine Wo Contrast  Result Date: 07/24/2022 CLINICAL DATA:  Head trauma, moderate-severe; Polytrauma, blunt EXAM: CT HEAD WITHOUT CONTRAST CT CERVICAL SPINE WITHOUT CONTRAST TECHNIQUE: Multidetector CT imaging of the head and cervical spine was performed following the standard protocol without intravenous contrast. Multiplanar CT image reconstructions of the cervical spine were also generated. RADIATION DOSE REDUCTION: This exam was performed according to the departmental dose-optimization program which includes automated exposure control, adjustment of the mA and/or kV according to patient size and/or use of iterative reconstruction technique. COMPARISON:  None Available. FINDINGS: CT HEAD FINDINGS Brain: No evidence of acute infarction, hemorrhage, hydrocephalus, extra-axial collection or mass lesion/mass effect. Vascular: No hyperdense vessel identified. Skull: No acute fracture. Sinuses/Orbits: Visualized sinuses are clear. No acute orbital findings. Other: No mastoid effusions. CT CERVICAL SPINE FINDINGS Alignment: Reversal of the normal cervical lordosis. Skull base and vertebrae: Vertebral body heights are maintained. No evidence of acute fracture. Soft tissues and spinal canal: No prevertebral fluid or swelling. No visible canal hematoma. Disc levels:  No  significant focal bony degenerative change. Upper chest: Visualized lung apices are clear. IMPRESSION: 1. No evidence of acute intracranial abnormality. 2. No evidence of acute fracture or traumatic malalignment in the cervical spine. Electronically Signed   By: Feliberto Harts M.D.   On: 07/24/2022 14:08    EKG: Independently reviewed. See above  Assessment/Plan  Syncope with fall  -admit to tele -related to orthostasis  -labs suggest dehydration with noted hemoconcentration/ on cbc/hyaline cast on UA -on presentation patient also noted to have soft BP and + orthostatics  -of note initial neuro evaluation notes non focal neuro exam , negative brain imaging  -cardiac evaluation also unrevealing  -to be complete will f/u with echo  - continue with ivfs overnight  -repeat orthostatic vitals in am  -neuro checks -further syncope evaluation if symptoms recur   Leukocytosis  -no noted fever, no noted source of infection  -monitor counts  - stress response / hemoconcentration   Schizophrenia  Depression -resume medications in am    DVT prophylaxis: heparin Code Status: full Family Communication: none at bedside   Disposition Plan: patient  expected to be admitted less than 2 midnights  Consults called: n/a Admission status: med tele   Lurline Del MD [facility]   If 7PM-7AM, please contact night-coverage [email or web address] Password [facility]  07/24/2022, 7:36 PM

## 2022-07-24 NOTE — ED Provider Notes (Signed)
[facility] EMERGENCY DEPT Provider Note   CSN: [id number] Arrival date & time: 07/24/22  1153     History  Chief Complaint  Patient presents with   Fall    Charlotte [name] is a 56 y.o. female.  HPI     56 year old female comes in with chief complaint of fall.  Patient has history of schizophrenia, depression.  She resides with her family.  Yesterday she had a fall at 9:30 PM and then again at 11:30 PM.  Patient is not the best historian.  She states that the first fall occurred after she had stood up from the commode.  She does not recall getting dizzy or recall falling down.  The fall at 11:30 PM, she does not recall going to the bathroom.  She just remembers waking up with people surrounding her.  Mother states that patient appeared confused and was speaking " gibberish" after the second fall.  There was no incontinence.  Nobody witnessed seizure-like activity.  There is no history of seizures.  Patient denies any chest pain, palpitations, shortness of breath.  From the fall, she is having posterior headache, neck pain, lower extremity pain.  Patient has no history of PE, DVT and no risk factors for it.  There is some history of varicose veins in the family and one of the sisters had DVT.  Patient has been on her psychiatric medications since 2017 without any complications.  Home Medications Prior to Admission medications   Medication Sig Start Date End Date Taking? Authorizing Provider  benztropine (COGENTIN) 0.5 MG tablet Take 0.5 mg by mouth 2 (two) times daily. 03/20/16   [provider]  clonazePAM (KLONOPIN) 0.5 MG tablet Take 0.5 mg by mouth at bedtime. 06/24/22   [provider]  cloZAPine (CLOZARIL) 100 MG tablet Take 100 mg by mouth 2 (two) times daily.     [provider]  venlafaxine XR (EFFEXOR-XR) 75 MG 24 hr capsule Take 75 mg by mouth daily. 03/20/16   [provider]      Allergies    Divalproex sodium,  Risperidone, and Sulfonamide derivatives    Review of Systems   Review of Systems  All other systems reviewed and are negative.   Physical Exam Updated Vital Signs BP 114/84 (BP Location: Right Arm)   Pulse (!) 108   Temp 98.2 F (36.8 C)   Resp 14   SpO2 100%  Physical Exam Vitals and nursing note reviewed.  Constitutional:      Appearance: She is well-developed.  HENT:     Head: Atraumatic.  Eyes:     Extraocular Movements: Extraocular movements intact.     Pupils: Pupils are equal, round, and reactive to light.     Comments: Patient has bidirectional horizontal nystagmus  Cardiovascular:     Rate and Rhythm: Tachycardia present.  Pulmonary:     Effort: Pulmonary effort is normal.  Musculoskeletal:     Cervical back: Normal range of motion and neck supple.  Skin:    General: Skin is warm and dry.  Neurological:     Mental Status: She is alert and oriented to person, place, and time.     Cranial Nerves: No cranial nerve deficit.     Sensory: No sensory deficit.     Motor: No weakness.     Coordination: Coordination normal.     Gait: Gait normal.     ED Results / Procedures / Treatments   Labs (all labs ordered are listed, but only  abnormal results are displayed) Labs Reviewed  COMPREHENSIVE METABOLIC PANEL - Abnormal; Notable for the following components:      Result Value   Glucose, Bld 120 (*)    Creatinine, Ser 1.32 (*)    GFR, Estimated 48 (*)    All other components within normal limits  CBC - Abnormal; Notable for the following components:   WBC 14.2 (*)    RBC 5.37 (*)    Hemoglobin 15.6 (*)    HCT 46.7 (*)    All other components within normal limits  URINALYSIS, ROUTINE W REFLEX MICROSCOPIC - Abnormal; Notable for the following components:   Hgb urine dipstick TRACE (*)    Protein, ur 30 (*)    All other components within normal limits  D-DIMER, QUANTITATIVE - Abnormal; Notable for the following components:   D-Dimer, Quant 3.68 (*)    All  other components within normal limits  LIPASE, BLOOD  PREGNANCY, URINE  MAGNESIUM    EKG EKG Interpretation  Date/Time:  Friday July 24 2022 12:41:14 EDT Ventricular Rate:  104 PR Interval:  146 QRS Duration: 93 QT Interval:  389 QTC Calculation: 512 R Axis:   -6 Text Interpretation: Sinus tachycardia Borderline T abnormalities, anterior leads Prolonged QT interval No acute changes No significant change since last tracing Confirmed by Derwood Kaplan [phone]) on 07/24/2022 12:47:05 PM  Radiology CT Angio Chest PE W and/or Wo Contrast  Result Date: 07/24/2022 CLINICAL DATA:  Pulmonary embolism suspected. Positive D-dimer. Patient fell last night loss of consciousness. EXAM: CT ANGIOGRAPHY CHEST WITH CONTRAST TECHNIQUE: Multidetector CT imaging of the chest was performed using the standard protocol during bolus administration of intravenous contrast. Multiplanar CT image reconstructions and MIPs were obtained to evaluate the vascular anatomy. RADIATION DOSE REDUCTION: This exam was performed according to the departmental dose-optimization program which includes automated exposure control, adjustment of the mA and/or kV according to patient size and/or use of iterative reconstruction technique. CONTRAST:  34mL OMNIPAQUE IOHEXOL 350 MG/ML SOLN COMPARISON:  None FINDINGS: Cardiovascular: Satisfactory opacification of the pulmonary arteries to the segmental level. No evidence of pulmonary embolism. Normal heart size. No pericardial effusion. Mediastinum/Nodes: No enlarged mediastinal, hilar, or axillary lymph nodes. Thyroid gland, trachea, and esophagus demonstrate no significant findings. Lungs/Pleura: Lungs are clear. No pleural effusion or pneumothorax. Upper Abdomen: Small hiatal hernia.  No acute abnormality. Musculoskeletal: No chest wall abnormality. No acute or significant osseous findings. Review of the MIP images confirms the above findings. IMPRESSION: 1.  No evidence of pulmonary embolism.  2.  No acute cardiopulmonary process. Electronically Signed   By: Larose Hires D.O.   On: 07/24/2022 15:58   US Venous Img Lower Bilateral  Result Date: 07/24/2022 CLINICAL DATA:  Fall, injury, left ankle pain, chest pain and elevated D-dimer. EXAM: BILATERAL LOWER EXTREMITY VENOUS DOPPLER ULTRASOUND TECHNIQUE: Gray-scale sonography with graded compression, as well as color Doppler and duplex ultrasound were performed to evaluate the lower extremity deep venous systems from the level of the common femoral vein and including the common femoral, femoral, profunda femoral, popliteal and calf veins including the posterior tibial, peroneal and gastrocnemius veins when visible. The superficial great saphenous vein was also interrogated. Spectral Doppler was utilized to evaluate flow at rest and with distal augmentation maneuvers in the common femoral, femoral and popliteal veins. COMPARISON:  None Available. FINDINGS: RIGHT LOWER EXTREMITY Common Femoral Vein: No evidence of thrombus. Normal compressibility, respiratory phasicity and response to augmentation. Saphenofemoral Junction: No evidence of thrombus. Normal compressibility and flow  on color Doppler imaging. Profunda Femoral Vein: No evidence of thrombus. Normal compressibility and flow on color Doppler imaging. Femoral Vein: No evidence of thrombus. Normal compressibility, respiratory phasicity and response to augmentation. Popliteal Vein: No evidence of thrombus. Normal compressibility, respiratory phasicity and response to augmentation. Calf Veins: No evidence of thrombus. Normal compressibility and flow on color Doppler imaging. Superficial Great Saphenous Vein: No evidence of thrombus. Normal compressibility. Venous Reflux:  None. Other Findings: No evidence of superficial thrombophlebitis or abnormal fluid collection. LEFT LOWER EXTREMITY Common Femoral Vein: No evidence of thrombus. Normal compressibility, respiratory phasicity and response to  augmentation. Saphenofemoral Junction: No evidence of thrombus. Normal compressibility and flow on color Doppler imaging. Profunda Femoral Vein: No evidence of thrombus. Normal compressibility and flow on color Doppler imaging. Femoral Vein: No evidence of thrombus. Normal compressibility, respiratory phasicity and response to augmentation. Popliteal Vein: No evidence of thrombus. Normal compressibility, respiratory phasicity and response to augmentation. Calf Veins: No evidence of thrombus. Normal compressibility and flow on color Doppler imaging. Superficial Great Saphenous Vein: No evidence of thrombus. Normal compressibility. Venous Reflux:  None. Other Findings: No evidence of superficial thrombophlebitis or abnormal fluid collection. IMPRESSION: No evidence of deep venous thrombosis in either lower extremity. Electronically Signed   By: Irish Lack M.D.   On: 07/24/2022 15:27   CT Head Wo Contrast  Result Date: 07/24/2022 CLINICAL DATA:  Head trauma, moderate-severe; Polytrauma, blunt EXAM: CT HEAD WITHOUT CONTRAST CT CERVICAL SPINE WITHOUT CONTRAST TECHNIQUE: Multidetector CT imaging of the head and cervical spine was performed following the standard protocol without intravenous contrast. Multiplanar CT image reconstructions of the cervical spine were also generated. RADIATION DOSE REDUCTION: This exam was performed according to the departmental dose-optimization program which includes automated exposure control, adjustment of the mA and/or kV according to patient size and/or use of iterative reconstruction technique. COMPARISON:  None Available. FINDINGS: CT HEAD FINDINGS Brain: No evidence of acute infarction, hemorrhage, hydrocephalus, extra-axial collection or mass lesion/mass effect. Vascular: No hyperdense vessel identified. Skull: No acute fracture. Sinuses/Orbits: Visualized sinuses are clear. No acute orbital findings. Other: No mastoid effusions. CT CERVICAL SPINE FINDINGS Alignment: Reversal  of the normal cervical lordosis. Skull base and vertebrae: Vertebral body heights are maintained. No evidence of acute fracture. Soft tissues and spinal canal: No prevertebral fluid or swelling. No visible canal hematoma. Disc levels:  No significant focal bony degenerative change. Upper chest: Visualized lung apices are clear. IMPRESSION: 1. No evidence of acute intracranial abnormality. 2. No evidence of acute fracture or traumatic malalignment in the cervical spine. Electronically Signed   By: Feliberto Harts M.D.   On: 07/24/2022 14:08   CT Cervical Spine Wo Contrast  Result Date: 07/24/2022 CLINICAL DATA:  Head trauma, moderate-severe; Polytrauma, blunt EXAM: CT HEAD WITHOUT CONTRAST CT CERVICAL SPINE WITHOUT CONTRAST TECHNIQUE: Multidetector CT imaging of the head and cervical spine was performed following the standard protocol without intravenous contrast. Multiplanar CT image reconstructions of the cervical spine were also generated. RADIATION DOSE REDUCTION: This exam was performed according to the departmental dose-optimization program which includes automated exposure control, adjustment of the mA and/or kV according to patient size and/or use of iterative reconstruction technique. COMPARISON:  None Available. FINDINGS: CT HEAD FINDINGS Brain: No evidence of acute infarction, hemorrhage, hydrocephalus, extra-axial collection or mass lesion/mass effect. Vascular: No hyperdense vessel identified. Skull: No acute fracture. Sinuses/Orbits: Visualized sinuses are clear. No acute orbital findings. Other: No mastoid effusions. CT CERVICAL SPINE FINDINGS Alignment: Reversal of the normal cervical  lordosis. Skull base and vertebrae: Vertebral body heights are maintained. No evidence of acute fracture. Soft tissues and spinal canal: No prevertebral fluid or swelling. No visible canal hematoma. Disc levels:  No significant focal bony degenerative change. Upper chest: Visualized lung apices are clear.  IMPRESSION: 1. No evidence of acute intracranial abnormality. 2. No evidence of acute fracture or traumatic malalignment in the cervical spine. Electronically Signed   By: Feliberto Harts M.D.   On: 07/24/2022 14:08    Procedures Procedures    Medications Ordered in ED Medications  sodium chloride 0.9 % bolus 1,000 mL (0 mLs Intravenous Stopped 07/24/22 1544)  lactated ringers bolus 1,000 mL (1,000 mLs Intravenous New Bag/Given 07/24/22 1544)  iohexol (OMNIPAQUE) 350 MG/ML injection 100 mL (61 mLs Intravenous Contrast Given 07/24/22 1535)    ED Course/ Medical Decision Making/ A&P Clinical Course as of 07/24/22 1608  Fri Jul 24, 2022  1540 Assumed care from Dr Rhunette Croft. [name] with hx of schizophrenia who presented after 2 falls 2/2 near syncope vs syncope. Does have long QT on EKG. Dimer was elevated. Noted to be persistently tachycardic. DVT study was negative. Orthostatic as well so is getting 2L of fluids. Awaiting CT PE and repeat ekg.  [RP]    Clinical Course User Index [RP] Rondel Baton, MD                           Medical Decision Making Amount and/or Complexity of Data Reviewed Labs: ordered. Radiology: ordered.  Risk Prescription drug management.   This patient presents to the ED with chief complaint(s) of fall, with near syncope, dizziness and questionable syncope with pertinent past medical history of paranoid schizophrenia which further complicates the presenting complaint. The complaint involves an extensive differential diagnosis and also carries with it a high risk of complications and morbidity.    The differential diagnosis includes  Orthostatic hypotension Stroke Vertebral artery dissection/stenosis Dysrhythmia PE Vasovagal/neurocardiogenic syncope Aortic stenosis Valvular disorder/Cardiomyopathy Anemia Seizures  Patient is noted to be tachycardic.  EKG ordered, there is prolonged QTc.  She is on psychiatric medications that are likely contributing.   We will get electrolytes to make sure there is no significant abnormality.  She had some calf tenderness on my exam.  We will get ultrasound DVT.  D-dimer has also been sent.  Orthostatics have been sent as well.   Additional history obtained: Additional history obtained from family  Independent labs interpretation:  The following labs were independently interpreted: Patient appears hemoconcentrated.  Creatinine is slightly elevated.  Urine analysis shows no ketones.  D-dimer is elevated over 3.  Independent visualization of imaging: - I independently visualized the following imaging with scope of interpretation limited to determining acute life threatening conditions related to emergency care: CT scan of the brain, which revealed no evidence of brain bleed  Treatment and Reassessment: With elevated D-dimer, we have proceeded to get CT angio PE. Patient was noted to have orthostatic hypotension.  2 L of IV fluid has been ordered.  Plan is to for patient to be reassessed after CT PE.  If the CT PE is negative, will repeat EKG and consider admission if there is any concerns for prolonged QTc.  Patient's care has been signed out to incoming team. Final Clinical Impression(s) / ED Diagnoses Final diagnoses:  Syncope and collapse    Rx / DC Orders ED Discharge Orders     None  Derwood KaplanNanavati, Deeksha Cotrell, MD 07/24/22 [phone]

## 2022-07-24 NOTE — ED Notes (Signed)
Pt called out stating acute onset chest pain. Pt states it feels like someone blowing up a balloon inside her chest. Pain worse with inspiration. EKG captured.

## 2022-07-24 NOTE — Plan of Care (Signed)
  Problem: Education: Goal: Knowledge of General Education information will improve Description: Including pain rating scale, medication(s)/side effects and non-pharmacologic comfort measures Outcome: Progressing   Problem: Health Behavior/Discharge Planning: Goal: Ability to manage health-related needs will improve Outcome: Progressing   Problem: Clinical Measurements: Goal: Ability to maintain clinical measurements within normal limits will improve Outcome: Progressing   Problem: Clinical Measurements: Goal: Cardiovascular complication will be avoided Outcome: Progressing   Problem: Activity: Goal: Risk for activity intolerance will decrease Outcome: Progressing   Problem: Coping: Goal: Level of anxiety will decrease Outcome: Progressing   Problem: Pain Managment: Goal: General experience of comfort will improve Outcome: Progressing   Problem: Safety: Goal: Ability to remain free from injury will improve Outcome: Progressing   Problem: Skin Integrity: Goal: Risk for impaired skin integrity will decrease Outcome: Progressing

## 2022-07-24 NOTE — Progress Notes (Signed)
   07/24/22 1943  Assess: MEWS Score  Temp 98.1 F (36.7 C)  BP 105/72  MAP (mmHg) 80  Pulse Rate (!) 112  ECG Heart Rate (!) 113  Resp 19  SpO2 97 %  O2 Device Room Air  Assess: MEWS Score  MEWS Temp 0  MEWS Systolic 0  MEWS Pulse 2  MEWS RR 0  MEWS LOC 0  MEWS Score 2  MEWS Score Color Yellow  Assess: if the MEWS score is Yellow or Red  Were vital signs taken at a resting state? Yes  Focused Assessment No change from prior assessment  Does the patient meet 2 or more of the SIRS criteria? Yes  Does the patient have a confirmed or suspected source of infection? No  MEWS guidelines implemented *See Row Information* Yes  Treat  Pain Scale 0-10  Pain Score 0  Take Vital Signs  Increase Vital Sign Frequency  Yellow: Q 2hr X 2 then Q 4hr X 2, if remains yellow, continue Q 4hrs  Escalate  MEWS: Escalate Yellow: discuss with charge nurse/RN and consider discussing with provider and RRT  Notify: Charge Nurse/RN  Name of Charge Nurse/RN Notified Heather, RN  Date Charge Nurse/RN Notified 07/24/22  Time Charge Nurse/RN Notified 1948  Document  Patient Outcome Other (Comment) (stable. Asymptomatic)  Progress note created (see row info) Yes  Assess: SIRS CRITERIA  SIRS Temperature  0  SIRS Pulse 1  SIRS Respirations  0  SIRS WBC 1  SIRS Score Sum  2

## 2022-07-24 NOTE — ED Provider Notes (Signed)
  Physical Exam  BP 114/84 (BP Location: Right Arm)   Pulse (!) 108   Temp 98.2 F (36.8 C)   Resp 14   SpO2 100%   Physical Exam  Procedures  Procedures  ED Course / MDM   Clinical Course as of 07/25/22 0053  [facility] Jul 24, 2022  1540 Assumed care from Dr Rhunette Croft. [name] with hx of schizophrenia who presented after 2 falls 2/2 near syncope vs syncope. Does have long QT on EKG. Dimer was elevated. Noted to be persistently tachycardic. DVT study was negative. Orthostatic as well so is getting 2L of fluids. Awaiting CT PE and repeat ekg.  [RP]  1702 CT PE did not show any acute abnormalities.  Patient had repeat EKG that showed a persistently prolonged QT.  Given the patient's syncopal episodes feel that she is high risk and should be admitted for telemetry and possible adjustment of her psych medications. 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 [RP]    Clinical Course User Index [RP] Charlotte Baton, MD   Medical Decision Making Amount and/or Complexity of Data Reviewed Labs: ordered. Radiology: ordered.  Risk Prescription drug management. Decision regarding hospitalization.      Charlotte Baton, MD 07/25/22 [phone]

## 2022-07-24 NOTE — ED Triage Notes (Signed)
Patient reports the ER for a fall that happened last night around 10pm. She was going to the bathroom and passed out. Patient reports she is unsure if the fall caused her to pass out or if the LOC caused her to fall. Patient had some episodes of emesis. Reports today she is feeling dizzy and she I having nausea. Reports left ankle pain and abdominal pain.

## 2022-07-25 ENCOUNTER — Observation Stay (HOSPITAL_BASED_OUTPATIENT_CLINIC_OR_DEPARTMENT_OTHER): Payer: Medicare Other

## 2022-07-25 DIAGNOSIS — R55 Syncope and collapse: Secondary | ICD-10-CM

## 2022-07-25 LAB — COMPREHENSIVE METABOLIC PANEL
ALT: 14 U/L (ref 0–44)
AST: 17 U/L (ref 15–41)
Albumin: 3.3 g/dL — ABNORMAL LOW (ref 3.5–5.0)
Alkaline Phosphatase: 67 U/L (ref 38–126)
Anion gap: 8 (ref 5–15)
BUN: 7 mg/dL (ref 6–20)
CO2: 24 mmol/L (ref 22–32)
Calcium: 8.2 mg/dL — ABNORMAL LOW (ref 8.9–10.3)
Chloride: 108 mmol/L (ref 98–111)
Creatinine, Ser: 1.19 mg/dL — ABNORMAL HIGH (ref 0.44–1.00)
GFR, Estimated: 54 mL/min — ABNORMAL LOW (ref >60)
Glucose, Bld: 121 mg/dL — ABNORMAL HIGH (ref 70–99)
Potassium: 3.1 mmol/L — ABNORMAL LOW (ref 3.5–5.1)
Sodium: 140 mmol/L (ref 135–145)
Total Bilirubin: 0.8 mg/dL (ref 0.3–1.2)
Total Protein: 5.7 g/dL — ABNORMAL LOW (ref 6.5–8.1)

## 2022-07-25 LAB — HIV ANTIBODY (ROUTINE TESTING W REFLEX): HIV Screen 4th Generation wRfx: NONREACTIVE

## 2022-07-25 LAB — TSH: TSH: 4.965 u[IU]/mL — ABNORMAL HIGH (ref 0.350–4.500)

## 2022-07-25 LAB — CBC WITH DIFFERENTIAL/PLATELET
Abs Immature Granulocytes: 0.04 10*3/uL (ref 0.00–0.07)
Basophils Absolute: 0.1 10*3/uL (ref 0.0–0.1)
Basophils Relative: 1 %
Eosinophils Absolute: 0.1 10*3/uL (ref 0.0–0.5)
Eosinophils Relative: 1 %
HCT: 39.6 % (ref 36.0–46.0)
Hemoglobin: 13.5 g/dL (ref 12.0–15.0)
Immature Granulocytes: 0 %
Lymphocytes Relative: 11 %
Lymphs Abs: 1.2 10*3/uL (ref 0.7–4.0)
MCH: 29.6 pg (ref 26.0–34.0)
MCHC: 34.1 g/dL (ref 30.0–36.0)
MCV: 86.8 fL (ref 80.0–100.0)
Monocytes Absolute: 0.9 10*3/uL (ref 0.1–1.0)
Monocytes Relative: 8 %
Neutro Abs: 8.4 10*3/uL — ABNORMAL HIGH (ref 1.7–7.7)
Neutrophils Relative %: 79 %
Platelets: 167 10*3/uL (ref 150–400)
RBC: 4.56 MIL/uL (ref 3.87–5.11)
RDW: 13.2 % (ref 11.5–15.5)
WBC: 10.6 10*3/uL — ABNORMAL HIGH (ref 4.0–10.5)
nRBC: 0 % (ref 0.0–0.2)

## 2022-07-25 LAB — CBC
HCT: 39.5 % (ref 36.0–46.0)
Hemoglobin: 13.7 g/dL (ref 12.0–15.0)
MCH: 30 pg (ref 26.0–34.0)
MCHC: 34.7 g/dL (ref 30.0–36.0)
MCV: 86.6 fL (ref 80.0–100.0)
Platelets: 175 10*3/uL (ref 150–400)
RBC: 4.56 MIL/uL (ref 3.87–5.11)
RDW: 13.2 % (ref 11.5–15.5)
WBC: 10.5 10*3/uL (ref 4.0–10.5)
nRBC: 0 % (ref 0.0–0.2)

## 2022-07-25 LAB — RAPID URINE DRUG SCREEN, HOSP PERFORMED
Amphetamines: NOT DETECTED
Barbiturates: NOT DETECTED
Benzodiazepines: NOT DETECTED
Cocaine: NOT DETECTED
Opiates: NOT DETECTED
Tetrahydrocannabinol: NOT DETECTED

## 2022-07-25 LAB — HEMOGLOBIN A1C
Hgb A1c MFr Bld: 4.9 % (ref 4.8–5.6)
Mean Plasma Glucose: 93.93 mg/dL

## 2022-07-25 LAB — GLUCOSE, CAPILLARY: Glucose-Capillary: 125 mg/dL — ABNORMAL HIGH (ref 70–99)

## 2022-07-25 MED ORDER — VENLAFAXINE HCL ER 75 MG PO CP24
75.0000 mg | ORAL_CAPSULE | Freq: Every day | ORAL | Status: DC
  Administered 2022-07-25: 75 mg via ORAL
  Filled 2022-07-25: qty 1

## 2022-07-25 MED ORDER — BENZTROPINE MESYLATE 1 MG PO TABS
1.0000 mg | ORAL_TABLET | Freq: Every day | ORAL | Status: DC
  Filled 2022-07-25: qty 1

## 2022-07-25 MED ORDER — POTASSIUM CHLORIDE CRYS ER 20 MEQ PO TBCR
40.0000 meq | EXTENDED_RELEASE_TABLET | Freq: Once | ORAL | Status: AC
  Administered 2022-07-25: 40 meq via ORAL
  Filled 2022-07-25: qty 2

## 2022-07-25 MED ORDER — CLOZAPINE 100 MG PO TABS
100.0000 mg | ORAL_TABLET | Freq: Two times a day (BID) | ORAL | Status: DC
  Administered 2022-07-25: 100 mg via ORAL
  Filled 2022-07-25 (×2): qty 1

## 2022-07-25 MED ORDER — POTASSIUM CHLORIDE CRYS ER 20 MEQ PO TBCR
40.0000 meq | EXTENDED_RELEASE_TABLET | Freq: Once | ORAL | Status: AC
Start: 2022-07-25 — End: 2022-07-25
  Administered 2022-07-25: 40 meq via ORAL
  Filled 2022-07-25: qty 2

## 2022-07-25 NOTE — Discharge Summary (Signed)
DISCHARGE SUMMARY  Charlotte [name]  MR#: [id number]  DOB:10-07-1966  Date of Admission: 07/24/2022 Date of Discharge: 07/25/2022  Attending Physician:Anadalay Macdonell Silvestre Gunner, MD  Patient's HTX:HFSFSE, Lanora Manis, MD (Inactive)  Consults: none   Disposition: D/C home   Follow-up Appts:  Follow-up Information     Juluis Rainier, MD Follow up.   Specialty: Family Medicine Contact information: [address] [place] [address] [phone]                 Tests Needing Follow-up: -TTE results are pending at time of discharge  Discharge Diagnoses: Syncope with loss of consciousness Leukocytosis Hypokalemia Schizophrenia and depression  Initial presentation: 56 year old with a history of schizophrenia, depression, and chronic headaches who presented to [name] following 2 episodes of syncope with fall at home.  Her initial event occurred after using the commode.  There was no reported bladder or bowel incontinence or seizure-like activity.  There was no chest pain palpitations or prodrome appreciated.  She did admit to working in the heat a few days prior to the event with only moderate to limited fluid intake.  In the ER she was found to have a systolic blood pressure in the range of 79-98 and a heart rate of 113.  Her hemoglobin was 15.6.  CT head revealed no acute findings.  CT cervical spine was without acute findings.  CTa of the chest was without evidence of PE.  Hospital Course: The patient was monitored on telemetry with no significant arrhythmias appreciated.  She was volume resuscitated and with this her tachycardia resolved.  She was given supplemental potassium and her hypokalemia was felt to be due to increased loss in the setting of sweating and working in the heat without significant intake.  Her leukocytosis was felt to be due to hemoconcentration in the setting of her dehydration as there was no clinical evidence of an active infection.  No adjustments were  made in her chronic schizophrenia or depression medications.  8/5 evening she was happy to ambulate in the hallway with supervision from her nurse without any major complications.  She felt stable on her feet.  She was tolerating oral intake.  She was alert and oriented.  She was therefore cleared for discharge home.  At the time of her discharge an echocardiogram was still pending but it was not felt likely that a significant finding would be appreciated on this test and therefore not felt to be warranted to keep her in the hospital waiting on the results.  If significant results are noted she will be called at home to arrange for outpatient follow-up.  She is advised to increase oral intake of both foods and liquids.  Allergies as of 07/25/2022       Reactions   Divalproex Sodium Other (See Comments)   Causing tremors   Haloperidol Other (See Comments)   Pt's spouse states it makes pt shaky.     Risperidone Itching   Causes altered mental status   Sulfonamide Derivatives Other (See Comments)   REACTION: Eye irritation        Medication List     TAKE these medications    benztropine 1 MG tablet Commonly known as: COGENTIN Take 1 mg by mouth at bedtime.   clonazePAM 0.5 MG tablet Commonly known as: KLONOPIN Take 0.5 mg by mouth at bedtime.   cloZAPine 100 MG tablet Commonly known as: CLOZARIL Take 100 mg by mouth 2 (two) times daily.   venlafaxine XR 75 MG  24 hr capsule Commonly known as: EFFEXOR-XR Take 75 mg by mouth daily.        Day of Discharge BP 107/75 (BP Location: Right Arm)   Pulse 99   Temp 98.1 F (36.7 C) (Oral)   Resp 15   Ht 5\' 7"  (1.702 m)   Wt 74.4 kg   SpO2 100%   BMI 25.69 kg/m   Physical Exam: General: No acute respiratory distress Lungs: Clear to auscultation bilaterally without wheezes or crackles Cardiovascular: Regular rate and rhythm without murmur gallop or rub normal S1 and S2 Abdomen: Nontender, nondistended, soft, bowel sounds  positive, no rebound, no ascites, no appreciable mass Extremities: No significant cyanosis, clubbing, or edema bilateral lower extremities  Basic Metabolic Panel: Recent Labs  Lab 07/24/22 1220 07/25/22 0216  NA 141 140  K 3.6 3.1*  CL 102 108  CO2 25 24  GLUCOSE 120* 121*  BUN 14 7  CREATININE 1.32* 1.19*  CALCIUM 9.9 8.2*  MG 2.3  --     Liver Function Tests: Recent Labs  Lab 07/24/22 1220 07/25/22 0216  AST 17 17  ALT 12 14  ALKPHOS 86 67  BILITOT 0.6 0.8  PROT 7.4 5.7*  ALBUMIN 4.7 3.3*   CBC: Recent Labs  Lab 07/24/22 1220 07/25/22 0216  WBC 14.2* 10.6*  10.5  NEUTROABS  --  8.4*  HGB 15.6* 13.5  13.7  HCT 46.7* 39.6  39.5  MCV 87.0 86.8  86.6  PLT 213 167  175    Time spent in discharge (includes decision making & examination of pt): 30 minutes  07/25/2022, 6:32 PM   09/24/2022, MD [facility] Office  [phone]

## 2022-07-25 NOTE — Progress Notes (Signed)
*  PRELIMINARY RESULTS* Echocardiogram 2D Echocardiogram has been performed.  Charlotte [name] 07/25/2022, 2:46 PM

## 2022-07-25 NOTE — Care Management Obs Status (Signed)
MEDICARE OBSERVATION STATUS NOTIFICATION   Patient Details  Name: Charlotte [name] MRN: [id number] Date of Birth: [birth year]   Medicare Observation Status Notification Given:  Yes    Isaias Cowman, RN 07/25/2022, 4:32 PM

## 2022-07-25 NOTE — Progress Notes (Signed)
OT Cancellation Note  Patient Details Name: Charlotte [name] MRN: [id number] DOB: 1966/05/21   Cancelled Treatment:    Reason Eval/Treat Not Completed: OT screened, no needs identified, will sign off.  Zafar Debrosse D Darlene Bartelt 07/25/2022, 8:52 AM 07/25/2022  RP, OTR/L  Acute Rehabilitation Services  Office:  [phone]

## 2022-07-25 NOTE — Progress Notes (Signed)
PHARMACIST - PHYSICIAN ORDER COMMUNICATION  Charlotte [name] is a 56 y.o. year old female with a history of schizoprenia on Clozapine PTA. Continuing this medication order as an inpatient requires that monitoring parameters per REMS requirements must be met.   Clozapine REMS Dispense Authorization was obtained, and will dispense inpatient.  RDA code [id number].  Verified Clozapine dose: 146m BID  Last ANC value and date reported on the Clozapine REMS website: [place] frequency: monthly [place] (date) 08/25/22.

## 2022-07-25 NOTE — Evaluation (Signed)
Physical Therapy Evaluation & Discharge Patient Details Name: Charlotte [name] MRN: [id number] DOB: 1966-01-18 Today's Date: 07/25/2022  History of Present Illness  Pt is a 56 y.o. female admitted 07/24/21 with 2 episodes of syncope with fall at home; suspect dehydration and orthostasis. Head CT negative for acute injury. PMH includes schizophrenia, LBP, depression.   Clinical Impression  Patient evaluated by Physical Therapy with no further acute PT needs identified. PTA, pt independent with ambulation and ADLs, lives with parents who drive and assist with iADLs. Today, pt moving well at supervision-level due to recent syncope. Pt with decreased attention and awareness, difficulty determining if having true pre-syncopal symptoms with activity. Pt's sister present and supportive, reports she feels pt back to baseline mobility and cognition. All education has been completed and the patient has no further questions. Acute PT is signing off. Thank you for this referral.    Orthostatic BPs (*difficulty getting consistent readings with BP cuff malfunction & pt moving arm) Supine 97/71, HR 104  Sitting 102/79  Standing 118/104, HR 109  Standing after  min --/--  Post-ambulation 104/68     Recommendations for follow up therapy are one component of a multi-disciplinary discharge planning process, led by the attending physician.  Recommendations may be updated based on patient status, additional functional criteria and insurance authorization.  Follow Up Recommendations No PT follow up      Assistance Recommended at Discharge Intermittent Supervision/Assistance  Patient can return home with the following  A little help with bathing/dressing/bathroom;Assistance with cooking/housework;Assist for transportation;Help with stairs or ramp for entrance    Equipment Recommendations None recommended by PT  Recommendations for Other Services       Functional Status Assessment Patient has not had a recent  decline in their functional status     Precautions / Restrictions Precautions Precautions: Fall Restrictions Weight Bearing Restrictions: No      Mobility  Bed Mobility Overal bed mobility: Independent                  Transfers Overall transfer level: Needs assistance Equipment used: None Transfers: Sit to/from Stand Sit to Stand: Supervision           General transfer comment: multiple sit<>stands from EOB without assist; supervision for safety with h/o syncope    Ambulation/Gait Ambulation/Gait assistance: Supervision Gait Distance (Feet): 120 Feet Assistive device: None Gait Pattern/deviations: Step-through pattern, Decreased stride length       General Gait Details: slow, steady gait without overt instability or LOB; supervision for safety/lines due to h/o syncope  Stairs            Wheelchair Mobility    Modified Rankin (Stroke Patients Only)       Balance Overall balance assessment: No apparent balance deficits (not formally assessed)                                           Pertinent Vitals/Pain Pain Assessment Pain Assessment: Faces Faces Pain Scale: Hurts a little bit Pain Location: lower back Pain Descriptors / Indicators: Discomfort Pain Intervention(s): Monitored during session    Home Living Family/patient expects to be discharged to:: Private residence Living Arrangements: Parent Available Help at Discharge: Family;Available 24 hours/day Type of Home: House Home Access: Stairs to enter Entrance Stairs-Rails: None Entrance Stairs-Number of Steps: 2   Home Layout: One level   Additional Comments: Lives with parents  Prior Function Prior Level of Function : Independent/Modified Independent;Needs assist             Mobility Comments: indep ambulating without DME; reports no prior falls except for >5 yrs ago. Does not drive; parents drive ADLs Comments: typically indep with ADLs. pt performs a  couple household chores, but parents do majority of this and cooking     Hand Dominance        Extremity/Trunk Assessment   Upper Extremity Assessment Upper Extremity Assessment: Overall WFL for tasks assessed    Lower Extremity Assessment Lower Extremity Assessment: Overall WFL for tasks assessed       Communication   Communication: No difficulties  Cognition Arousal/Alertness: Awake/alert Behavior During Therapy: WFL for tasks assessed/performed Overall Cognitive Status: History of cognitive impairments - at baseline                                 General Comments: noted decreased attention requiring frequent redirection, suspect baseline cognition; pt's sister present and reports pt's cognition is baseline        General Comments General comments (skin integrity, edema, etc.): difficulty getting consistent BP measurements during session, pt talking often and moving UE requiring frequent cues to correct; supine BP 97/71, post-ambulation BP 104/68; pt with 1x episode standing and reports her head feels like a "kaleidoscope" but later attributes this to her thoughts, not vision/dizziness; difficult to determine if having true dizziness or hypotensive symptoms. pt's sister present and supportive, reports no concerns having pt return home    Exercises     Assessment/Plan    PT Assessment Patient does not need any further PT services  PT Problem List         PT Treatment Interventions      PT Goals (Current goals can be found in the Care Plan section)  Acute Rehab PT Goals PT Goal Formulation: All assessment and education complete, DC therapy    Frequency       Co-evaluation               AM-PAC PT "6 Clicks" Mobility  Outcome Measure Help needed turning from your back to your side while in a flat bed without using bedrails?: None Help needed moving from lying on your back to sitting on the side of a flat bed without using bedrails?:  None Help needed moving to and from a bed to a chair (including a wheelchair)?: A Little Help needed standing up from a chair using your arms (e.g., wheelchair or bedside chair)?: A Little Help needed to walk in hospital room?: A Little Help needed climbing 3-5 steps with a railing? : A Little 6 Click Score: 20    End of Session Equipment Utilized During Treatment: Gait belt Activity Tolerance: Patient tolerated treatment well Patient left: in bed;with call bell/phone within reach;with family/visitor present Nurse Communication: Mobility status PT Visit Diagnosis: Other abnormalities of gait and mobility (R26.89)    Time: 2542-7062 PT Time Calculation (min) (ACUTE ONLY): 19 min   Charges:   PT Evaluation $PT Eval Low Complexity: 1 Low        Ina Homes, PT, DPT Acute Rehabilitation Services  Personal: Secure Chat Rehab Office: [phone]  Malachy Chamber 07/25/2022, 9:20 AM

## 2022-07-26 LAB — URINE CULTURE

## 2022-07-26 LAB — ECHOCARDIOGRAM COMPLETE
Area-P 1/2: 2.8 cm2
Height: 67 in
S' Lateral: 2.5 cm
Weight: 2624 oz

## 2024-01-25 ENCOUNTER — Encounter (HOSPITAL_BASED_OUTPATIENT_CLINIC_OR_DEPARTMENT_OTHER): Payer: Self-pay | Admitting: Obstetrics and Gynecology

## 2024-01-25 ENCOUNTER — Other Ambulatory Visit: Payer: Self-pay | Admitting: Obstetrics and Gynecology

## 2024-01-25 DIAGNOSIS — R928 Other abnormal and inconclusive findings on diagnostic imaging of breast: Secondary | ICD-10-CM

## 2024-01-25 LAB — EXTERNAL GENERIC LAB PROCEDURE: COLOGUARD: POSITIVE — AB

## 2024-01-26 ENCOUNTER — Encounter (HOSPITAL_BASED_OUTPATIENT_CLINIC_OR_DEPARTMENT_OTHER): Payer: Self-pay | Admitting: Obstetrics and Gynecology

## 2024-01-26 NOTE — Progress Notes (Signed)
 Spoke w/ via phone for pre-op interview--- pt's mother/ caregiver, Charlotte [name] needs dos----        cbc [name] results------ no COVID test -----patient states asymptomatic no test needed Arrive at ------- 1000 on 01-28-2024 NPO after MN NO Solid Food.  Clear liquids from MN until--- 0900  Med rec completed Medications to take morning of surgery ----- effexor , clozapine , clonazepam  Diabetic medication ----- n/a Patient instructed no nail polish to be worn day of surgery Patient instructed to bring photo id and insurance card day of surgery Patient aware to have Driver (ride ) / caregiver    for 24 hours after surgery - sister, Charlotte [name] Patient Special Instructions ----- n/a Pre-Op special Instructions -----  pt w/ cognitive disabilities will need sister with her to pre-op.  Pt had never had surgery before  Patient verbalized understanding of instructions that were given at this phone interview. Patient denies chest pain, sob, fever, cough at the interview.

## 2024-01-27 NOTE — Anesthesia Preprocedure Evaluation (Addendum)
 Anesthesia Evaluation  Patient identified by MRN, date of birth, ID band Patient awake    Reviewed: Allergy & Precautions, NPO status , Patient's Chart, lab work & pertinent test results  Airway Mallampati: II  TM Distance: >3 FB Neck ROM: Full    Dental no notable dental hx. (+) Teeth Intact, Missing, Dental Advisory Given,    Pulmonary former smoker   Pulmonary exam normal breath sounds clear to auscultation       Cardiovascular Normal cardiovascular exam Rhythm:Regular Rate:Normal     Neuro/Psych  Headaches PSYCHIATRIC DISORDERS  Depression  Schizophrenia     GI/Hepatic Neg liver ROS,GERD  ,,  Endo/Other  negative endocrine ROS    Renal/GU negative Renal ROS     Musculoskeletal negative musculoskeletal ROS (+)    Abdominal   Peds  Hematology negative hematology ROS (+)   Anesthesia Other Findings   Reproductive/Obstetrics                             Anesthesia Physical Anesthesia Plan  ASA: 2  Anesthesia Plan: General   Post-op Pain Management: Tylenol  PO (pre-op)*   Induction: Intravenous  PONV Risk Score and Plan: 3 and Propofol  infusion, Treatment may vary due to age or medical condition, TIVA and Ondansetron   Airway Management Planned: LMA  Additional Equipment: None  Intra-op Plan:   Post-operative Plan: Extubation in OR  Informed Consent: I have reviewed the patients History and Physical, chart, labs and discussed the procedure including the risks, benefits and alternatives for the proposed anesthesia with the patient or authorized representative who has indicated his/her understanding and acceptance.     Dental advisory given  Plan Discussed with: CRNA and Anesthesiologist  Anesthesia Plan Comments: (TIVA GA )        Anesthesia Quick Evaluation

## 2024-01-28 ENCOUNTER — Ambulatory Visit (HOSPITAL_BASED_OUTPATIENT_CLINIC_OR_DEPARTMENT_OTHER)
Admission: RE | Admit: 2024-01-28 | Discharge: 2024-01-28 | Disposition: A | Discharge status: Home / Self Care | Payer: Medicare Other | Attending: Obstetrics and Gynecology | Admitting: Obstetrics and Gynecology

## 2024-01-28 ENCOUNTER — Ambulatory Visit (HOSPITAL_BASED_OUTPATIENT_CLINIC_OR_DEPARTMENT_OTHER): Payer: Medicare Other | Admitting: Anesthesiology

## 2024-01-28 ENCOUNTER — Encounter (HOSPITAL_BASED_OUTPATIENT_CLINIC_OR_DEPARTMENT_OTHER)
Admission: RE | Disposition: A | Discharge status: Home / Self Care | Payer: Self-pay | Source: Home / Self Care | Attending: Obstetrics and Gynecology

## 2024-01-28 ENCOUNTER — Other Ambulatory Visit: Payer: Self-pay

## 2024-01-28 ENCOUNTER — Encounter (HOSPITAL_BASED_OUTPATIENT_CLINIC_OR_DEPARTMENT_OTHER): Payer: Self-pay | Admitting: Obstetrics and Gynecology

## 2024-01-28 DIAGNOSIS — N950 Postmenopausal bleeding: Secondary | ICD-10-CM | POA: Diagnosis present

## 2024-01-28 DIAGNOSIS — Z87891 Personal history of nicotine dependence: Secondary | ICD-10-CM | POA: Insufficient documentation

## 2024-01-28 DIAGNOSIS — N841 Polyp of cervix uteri: Secondary | ICD-10-CM

## 2024-01-28 DIAGNOSIS — R87619 Unspecified abnormal cytological findings in specimens from cervix uteri: Secondary | ICD-10-CM | POA: Diagnosis not present

## 2024-01-28 DIAGNOSIS — Z01818 Encounter for other preprocedural examination: Secondary | ICD-10-CM

## 2024-01-28 DIAGNOSIS — N854 Malposition of uterus: Secondary | ICD-10-CM | POA: Insufficient documentation

## 2024-01-28 HISTORY — DX: Other chronic pain: G89.29

## 2024-01-28 HISTORY — DX: Other constipation: K59.09

## 2024-01-28 HISTORY — DX: Presence of spectacles and contact lenses: Z97.3

## 2024-01-28 HISTORY — DX: Anxiety disorder, unspecified: F41.9

## 2024-01-28 HISTORY — DX: Polyp of corpus uteri: N84.0

## 2024-01-28 HISTORY — PX: HYSTEROSCOPY WITH D & C: SHX1775

## 2024-01-28 HISTORY — DX: Bipolar disorder, unspecified: F31.9

## 2024-01-28 HISTORY — DX: Headache, unspecified: R51.9

## 2024-01-28 HISTORY — PX: LEEP: SHX91

## 2024-01-28 HISTORY — PX: VULVA /PERINEUM BIOPSY: SHX319

## 2024-01-28 HISTORY — DX: Asymptomatic varicose veins of unspecified lower extremity: I83.90

## 2024-01-28 LAB — CBC
HCT: 43.5 % (ref 36.0–46.0)
Hemoglobin: 13.7 g/dL (ref 12.0–15.0)
MCH: 28.7 pg (ref 26.0–34.0)
MCHC: 31.5 g/dL (ref 30.0–36.0)
MCV: 91.2 fL (ref 80.0–100.0)
Platelets: 186 10*3/uL (ref 150–400)
RBC: 4.77 MIL/uL (ref 3.87–5.11)
RDW: 13.2 % (ref 11.5–15.5)
WBC: 6.9 10*3/uL (ref 4.0–10.5)
nRBC: 0 % (ref 0.0–0.2)

## 2024-01-28 SURGERY — DILATATION AND CURETTAGE /HYSTEROSCOPY
Anesthesia: General | Site: Vagina

## 2024-01-28 MED ORDER — LACTATED RINGERS IV SOLN
INTRAVENOUS | Status: DC
Start: 2024-01-28 — End: 2024-01-28

## 2024-01-28 MED ORDER — KETOROLAC TROMETHAMINE 30 MG/ML IJ SOLN
INTRAMUSCULAR | Status: AC
  Filled 2024-01-28: qty 1

## 2024-01-28 MED ORDER — SODIUM CHLORIDE 0.9 % IR SOLN
Status: DC | PRN
  Administered 2024-01-28 (×2): 3000 mL

## 2024-01-28 MED ORDER — LIDOCAINE HCL 1 % IJ SOLN
INTRAMUSCULAR | Status: DC | PRN
  Administered 2024-01-28: 12 mL

## 2024-01-28 MED ORDER — ACETAMINOPHEN 500 MG PO TABS
1000.0000 mg | ORAL_TABLET | ORAL | Status: AC
  Administered 2024-01-28: 1000 mg via ORAL

## 2024-01-28 MED ORDER — OXYCODONE HCL 5 MG/5ML PO SOLN: 5.0000 mg | Freq: Once | ORAL | Status: DC | PRN

## 2024-01-28 MED ORDER — SILVER NITRATE-POT NITRATE 75-25 % EX MISC
CUTANEOUS | Status: DC | PRN
  Administered 2024-01-28: 2 via TOPICAL

## 2024-01-28 MED ORDER — ONDANSETRON HCL 4 MG/2ML IJ SOLN: 4.0000 mg | Freq: Once | INTRAMUSCULAR | Status: DC | PRN

## 2024-01-28 MED ORDER — GABAPENTIN 300 MG PO CAPS
ORAL_CAPSULE | ORAL | Status: AC
  Filled 2024-01-28: qty 1

## 2024-01-28 MED ORDER — GABAPENTIN 300 MG PO CAPS
300.0000 mg | ORAL_CAPSULE | ORAL | Status: AC
  Administered 2024-01-28: 300 mg via ORAL

## 2024-01-28 MED ORDER — STERILE WATER FOR IRRIGATION IR SOLN
Status: DC | PRN
  Administered 2024-01-28: 500 mL

## 2024-01-28 MED ORDER — PROPOFOL 500 MG/50ML IV EMUL
INTRAVENOUS | Status: DC | PRN
  Administered 2024-01-28: 175 ug/kg/min via INTRAVENOUS

## 2024-01-28 MED ORDER — ACETAMINOPHEN 500 MG PO TABS
ORAL_TABLET | ORAL | Status: AC
Start: 2024-01-28 — End: ?
  Filled 2024-01-28: qty 2

## 2024-01-28 MED ORDER — FENTANYL CITRATE (PF) 100 MCG/2ML IJ SOLN
INTRAMUSCULAR | Status: AC
  Filled 2024-01-28: qty 2

## 2024-01-28 MED ORDER — PROPOFOL 10 MG/ML IV BOLUS
INTRAVENOUS | Status: DC | PRN
  Administered 2024-01-28: 150 mg via INTRAVENOUS

## 2024-01-28 MED ORDER — KETOROLAC TROMETHAMINE 30 MG/ML IJ SOLN
INTRAMUSCULAR | Status: DC | PRN
  Administered 2024-01-28: 30 mg via INTRAVENOUS

## 2024-01-28 MED ORDER — DEXMEDETOMIDINE HCL IN NACL 80 MCG/20ML IV SOLN
INTRAVENOUS | Status: DC | PRN
  Administered 2024-01-28 (×2): 4 ug via INTRAVENOUS

## 2024-01-28 MED ORDER — KETOROLAC TROMETHAMINE 30 MG/ML IJ SOLN: 30.0000 mg | Freq: Once | INTRAMUSCULAR | Status: DC | PRN

## 2024-01-28 MED ORDER — DEXAMETHASONE SODIUM PHOSPHATE 10 MG/ML IJ SOLN
INTRAMUSCULAR | Status: DC | PRN
  Administered 2024-01-28: 10 mg via INTRAVENOUS

## 2024-01-28 MED ORDER — LIDOCAINE 2% (20 MG/ML) 5 ML SYRINGE
INTRAMUSCULAR | Status: DC | PRN
  Administered 2024-01-28: 60 mg via INTRAVENOUS

## 2024-01-28 MED ORDER — LIDOCAINE HCL (PF) 2 % IJ SOLN
INTRAMUSCULAR | Status: AC
  Filled 2024-01-28: qty 5

## 2024-01-28 MED ORDER — HYDROMORPHONE HCL 1 MG/ML IJ SOLN: 0.2500 mg | INTRAMUSCULAR | Status: DC | PRN

## 2024-01-28 MED ORDER — PROPOFOL 1000 MG/100ML IV EMUL
INTRAVENOUS | Status: AC
  Filled 2024-01-28: qty 100

## 2024-01-28 MED ORDER — SOD CITRATE-CITRIC ACID 500-334 MG/5ML PO SOLN: 30.0000 mL | ORAL | Status: DC

## 2024-01-28 MED ORDER — ONDANSETRON HCL 4 MG/2ML IJ SOLN
INTRAMUSCULAR | Status: DC | PRN
  Administered 2024-01-28: 4 mg via INTRAVENOUS

## 2024-01-28 MED ORDER — DEXAMETHASONE SODIUM PHOSPHATE 10 MG/ML IJ SOLN
INTRAMUSCULAR | Status: AC
  Filled 2024-01-28: qty 1

## 2024-01-28 MED ORDER — ONDANSETRON HCL 4 MG/2ML IJ SOLN
INTRAMUSCULAR | Status: AC
  Filled 2024-01-28: qty 2

## 2024-01-28 MED ORDER — FENTANYL CITRATE (PF) 100 MCG/2ML IJ SOLN
INTRAMUSCULAR | Status: DC | PRN
Start: 2024-01-28 — End: 2024-01-28
  Administered 2024-01-28: 25 ug via INTRAVENOUS
  Administered 2024-01-28: 50 ug via INTRAVENOUS
  Administered 2024-01-28: 25 ug via INTRAVENOUS

## 2024-01-28 MED ORDER — OXYCODONE HCL 5 MG PO TABS: 5.0000 mg | ORAL_TABLET | Freq: Once | ORAL | Status: DC | PRN

## 2024-01-28 SURGICAL SUPPLY — 27 items
CATH ROBINSON RED A/P 16FR (CATHETERS) ×3 IMPLANT
DEVICE MYOSURE REACH (MISCELLANEOUS) IMPLANT
ELECT LLETZ BALL 5MM DISP (ELECTRODE) IMPLANT
ELECT LOOP LEEP RND 20X12 WHT (CUTTING LOOP) ×3
ELECT LOOP LEEP SQR 10X10 ORG (CUTTING LOOP) ×3
ELECTRODE LOOP LP RND 20X12WHT (CUTTING LOOP) IMPLANT
ELECTRODE LOOP LP SQR 10X10ORG (CUTTING LOOP) IMPLANT
GLOVE BIO SURGEON STRL SZ 6.5 (GLOVE) ×6 IMPLANT
GLOVE BIO SURGEON STRL SZ7 (GLOVE) ×3 IMPLANT
GLOVE BIOGEL PI IND STRL 7.0 (GLOVE) ×3 IMPLANT
GLOVE SURG SYN 6.5 ES PF (GLOVE) ×3
GLOVE SURG SYN 6.5 PF PI (GLOVE) IMPLANT
GOWN STRL REUS W/ TWL LRG LVL3 (GOWN DISPOSABLE) IMPLANT
GOWN STRL REUS W/TWL LRG LVL3 (GOWN DISPOSABLE) ×6 IMPLANT
IV NS IRRIG 3000ML ARTHROMATIC (IV SOLUTION) IMPLANT
KIT PROCEDURE FLUENT (KITS) ×3 IMPLANT
KIT TURNOVER CYSTO (KITS) ×3 IMPLANT
PACK PERINEAL COLD (PAD) IMPLANT
PACK VAGINAL WOMENS (CUSTOM PROCEDURE TRAY) ×3 IMPLANT
PAD OB MATERNITY 4.3X12.25 (PERSONAL CARE ITEMS) ×3 IMPLANT
PUNCH BIOPSY 3 (MISCELLANEOUS) IMPLANT
SCOPETTES 8 STERILE (MISCELLANEOUS) ×6 IMPLANT
SCRUB CHG 4% DYNA-HEX 4OZ (MISCELLANEOUS) ×3 IMPLANT
SEAL ROD LENS SCOPE MYOSURE (ABLATOR) ×6 IMPLANT
SLEEVE SCD COMPRESS KNEE MED (STOCKING) ×3 IMPLANT
TOWEL OR 17X24 6PK STRL BLUE (TOWEL DISPOSABLE) ×3 IMPLANT
WATER STERILE IRR 500ML POUR (IV SOLUTION) ×3 IMPLANT

## 2024-01-28 NOTE — Discharge Summary (Signed)
 Charlotte [name] is a 58 year old female with a history of postmenopausal bleeding and AGUS pap smear here today to undergo hysteroscopy, dilation and curettage, and LEEP. On exam, she was also found to have hypopigmented vulvar skin. Therefore, she underwent a myosure hysteroscopy, dilation and curettage, loop electrosurgical excision procedure, perineal biopsy. She tolerated the procedure well. She will follow up with Dr. Claire in 2 weeks for review of pathology results and further management.

## 2024-01-28 NOTE — Discharge Instructions (Signed)
  Post Anesthesia Home Care Instructions  Activity: Get plenty of rest for the remainder of the day. A responsible individual must stay with you for 24 hours following the procedure.  For the next 24 hours, DO NOT: -Drive a car -Advertising copywriter -Drink alcoholic beverages -Take any medication unless instructed by your physician -Make any legal decisions or sign important papers.  Meals: Start with liquid foods such as gelatin or soup. Progress to regular foods as tolerated. Avoid greasy, spicy, heavy foods. If nausea and/or vomiting occur, drink only clear liquids until the nausea and/or vomiting subsides. Call your physician if vomiting continues.  Special Instructions/Symptoms: Your throat may feel dry or sore from the anesthesia or the breathing tube placed in your throat during surgery. If this causes discomfort, gargle with warm salt water . The discomfort should disappear within 24 hours.  May take Tylenol  for cramps/soreness beginning at 4:30 PM.

## 2024-01-28 NOTE — Op Note (Signed)
 Procedure Note  Patient: Charlotte [name] is a 58 year old female with a history of postmenopausal bleeding and AGUS pap smear here today to undergo hysteroscopy, dilation and curettage, and LEEP. All R/B/A were previously discussed at her pre-op visit (see H&P)   Preoperative Diagnosis: postmenopausal bleeding, AGUS pap smear with unsatisfactory colposcopy, vulvar hypopigmentation Postoperative Diagnosis: same,   Procedure: myosure hysteroscopy, dilation and curettage, loop electrosurgical excision procedure, perineal biopsy     Surgeon: Rubie Husky, MD  Anesthesia: General anesthesia   Findings: Pelvic exam: Anteverted uterus. Normal appearing cervix. Hypopigmentation of skin of perineum and labia. Loss of architecture of labia minora.  Hysteroscopic exam: cervical polyp was noted and removed. Bilateral ostia unable to be visualized due to polypoid tissue filling the endometrial cavity. Total fluid deficit 240cc.  Estimated Blood Loss:  5mL         Hysteroscopy fluid deficit: 240cc  Specimens: 1) endocervical and endometrial curettings 2) endometrial curretings 3) exocervix  4) endocervix 5) perineal biopsy  Complications:  None         Disposition: PACU - hemodynamically stable.         Condition: stable    PROCEDURE IN DETAIL:  The patient was appropriately consented and taken to the operating room where anesthesia was administered without difficulty. SCDs were placed and connected. She was placed in the dorsal lithotomy position in stirrups. Her pubic hair was trimmed. She was examined under anesthesia and found to have an anteverted uterus and hypopigmentation of the perineum and labia. I called the patient's next of kin to discuss taking a biopsy of the hypopigmented area, and she confirmed that this would be ok. The patient was then prepped, bladder drained with in and out catheter, and draped in normal sterile fashion. A long speculum was inserted into the vagina. A  single-tooth tenaculum was used to grasp the anterior lip of the cervix. A paracervical block was obtained by injecting a total of 10mL of 1% lidocaine  at the 4 and 8 o'clock positions at the cervicovaginal junction. The cervical os was sequentially dilated using Pratt dilators to accommodate the hysteroscope. The hysteroscope was introduced under direct visualization and the uterus was distended with normal saline. Findings as noted above. The Myosure device was used to resect the polypoid tissue filling the uterine cavity. The hysteroscope was removed and sharp curettage was performed in hopes of better visualization of the endometrial cavity. The Myosure device was again used to resect the polypoid tissue filling the uterine cavity. The hysteroscope was removed and sharp curettage was performed again in hopes of better visualization of the endometrial cavity.Specimen was collected for pathology. Hemostasis was noted in the uterine cavity. The hysteroscope was removed. The tenaculum was removed from the cervix and good hemostasis was noted at the puncture sites. The speculum was removed.  An insulated speculum was then placed in the vagina. An electrosurgical loop was used to take a biopsy of the exocervix and then the endocervix. Electrosurgical cautery ball was used to obtain hemostasis of the biopsy site. The speculum was removed.  A 4mm punch biopsy of hypopigmented skin of the perineum was performed. The area was injected with 2cc of 1% lidocaine  plain. The biopsy site was made hemostatic with silver  nitrate.  The patient tolerated the procedure well. The instrument and sponge counts were correct times two. The patient was awakened from anesthesia and taken to the recovery room in stable condition.  Rubie Husky, MD 01/28/2024, 1:20pm

## 2024-01-28 NOTE — Transfer of Care (Signed)
 Immediate Anesthesia Transfer of Care Note  Patient: Midwife  Procedure(s) Performed: DILATATION AND CURETTAGE /HYSTEROSCOPY (Vagina ) LOOP ELECTROSURGICAL EXCISION PROCEDURE (LEEP) VULVAR BIOPSY  Patient Location: PACU  Anesthesia Type:General  Level of Consciousness: awake, alert , oriented, and patient cooperative  Airway & Oxygen Therapy: Patient Spontanous Breathing and Patient connected to nasal cannula oxygen  Post-op Assessment: Report given to RN and Post -op Vital signs reviewed and stable  Post vital signs: Reviewed and stable  Last Vitals:  Vitals Value Taken Time  BP 105/73 01/28/24 1321  Temp 35.9 C 01/28/24 1321  Pulse 77 01/28/24 1323  Resp 13 01/28/24 1323  SpO2 99 % 01/28/24 1323  Vitals shown include unfiled device data.  Last Pain:  Vitals:   01/28/24 1031  TempSrc: Oral  PainSc: 0-No pain      Patients Stated Pain Goal: 5 (01/28/24 1031)  Complications: No notable events documented.

## 2024-01-28 NOTE — Anesthesia Postprocedure Evaluation (Signed)
 Anesthesia Post Note  Patient: Charlotte [name]  Procedure(s) Performed: DILATATION AND CURETTAGE /HYSTEROSCOPY (Vagina ) LOOP ELECTROSURGICAL EXCISION PROCEDURE (LEEP) VULVAR BIOPSY     Patient location during evaluation: PACU Anesthesia Type: General Level of consciousness: awake and alert Pain management: pain level controlled Vital Signs Assessment: post-procedure vital signs reviewed and stable Respiratory status: spontaneous breathing, nonlabored ventilation, respiratory function stable and patient connected to nasal cannula oxygen Cardiovascular status: blood pressure returned to baseline and stable Postop Assessment: no apparent nausea or vomiting Anesthetic complications: no   No notable events documented.  Last Vitals:  Vitals:   01/28/24 1500 01/28/24 1530  BP: 113/85 (!) 115/92  Pulse: 88 88  Resp: 12 16  Temp: (!) 36.3 C (!) 36.3 C  SpO2: 100% 100%    Last Pain:  Vitals:   01/28/24 1530  TempSrc:   PainSc: 0-No pain                 [name]

## 2024-01-28 NOTE — H&P (Signed)
 The following pre-operative H&P was completed by me on 01/20/2024 in the outpatient setting.  History of Present Illness Pt. here for AEX and Mammo; Pt. states she has been spotting for the last 3 days while wiping, pink in color, denies any pain; D&C hysteroscopy scheduled 02/07 for PM bleeding; last pap 12/24/2023-atypical glandular cells; s/p colpo 01/04/2024; Mammo today; colo-never had one, pt. sent Cologuard in on Monday, results pending/tb  AEX: only additional complaint is skin lesions on the inferior aspect of the left breast.  D&C hysteroscopy scheduled 02/07 for PM bleeding and LEEP for unsatisfactory colposcopy in the setting of atypical glandular cells. I talked with Candy and her sister Gerlene about the planned procedures. I described the procedures in detail, using drawings. We thoroughly discussed risks, benefits, and alternatives to the procedures and all questions were answered.   Review of Systems ROS as noted in the HPI Screening None recorded Physical Exam Annual Exam [facility])  Chaperone Chaperone: present  Constitutional *General Appearance: healthy-appearing, well-nourished, well-developed  Head Head: normocephalic, atraumatic  Neck *ROM normal ROM *Thyroid : no enlargement, no nodules, non-tender  Lymph Nodes *Palpation: non-tender submandibular nodes, non-tender supraclavicular nodes, non-tender axillary nodes  Cardiovascular *Auscultation: RRR, no murmur *Peripheral Vascular: no edema  Lungs *Respiratory Effort: no accessory muscle usage, no intercostal retractions *Auscultation: clear to auscultation, no wheezing, no rales/crackles, no rhonchi Inspection: normal, normal respiratory rate  *Breast Bilateral: nipple appearance: normal, no abnormal nipple secretions, no tenderness, no masses palpable, symmetric Left Breast: (skin changes c/w two small healing abscesses on inferior aspect of breast)  Abdomen *Inspection/Palpation/Auscultation:  non-distended, no tenderness, no rebound, no guarding, soft *Hernia: none palpated  Female Genitalia *Examination: (previously performed on 12/24/23: Female Genitalia Vulva: no masses, no lesions, vulvar atrophy Vagina: no erythema, no abnormal vaginal discharge, no vesicle(s) or ulcers, tenderness Cervix: no discharge, cervical motion tenderness Uterus: normal size, normal shape, midline, no uterine prolapse, mobile, tender Bladder/Urethra: normal meatus, no urethral discharge, no urethral mass, bladder non distended, Urethra well supported (mild tenderness on palpation of bladder) Adnexa/Parametria: no parametrial tenderness, no parametrial mass, no ovarian mass, adnexal tenderness)  Extremities Legs: normal  Skin *Appearance: no rashes, no lesions  Neurological System Impressions: motor: no deficits, sensory: no deficits  Psychiatric *Orientation: to person, to place, to time *Mood and Affect: active and alert, normal mood, normal affect  Assessment & Plan screening mammography Order MAMMO, screening, tomosynthesis, bilateral, w/ CAD breast problem skin changes c/w two small healing abscesses on inferior aspect of breast. Per Millie, this has already been evaluated by derm, and mammo was performed today.  preoperative state D&C hysteroscopy scheduled 02/07 for PM bleeding and LEEP for unsatisfactory colposcopy in the setting of atypical glandular cells. I talked with Candy and her sister Gerlene about the planned procedures. I described the procedures in detail, using drawings. We thoroughly discussed risks, benefits, and alternatives to the procedures and all questions were answered.

## 2024-01-28 NOTE — Progress Notes (Signed)
 Charlotte [name] is a 58 year old female with a history of postmenopausal bleeding and AGUS pap smear here today to undergo hysteroscopy, dilation and curettage, and LEEP. All R/B/A were previously discussed at her pre-op visit (see H&P). No changes today, except that she is having vaginal bleeding.   Dispo: Move forward with surgery as planned.

## 2024-01-28 NOTE — Anesthesia Procedure Notes (Signed)
 Procedure Name: LMA Insertion Date/Time: 01/28/2024 12:01 PM  Performed by: Flara Storti D, CRNAPre-anesthesia Checklist: Patient identified, Emergency Drugs available, Suction available and Patient being monitored Patient Re-evaluated:Patient Re-evaluated prior to induction Oxygen Delivery Method: Circle system utilized Preoxygenation: Pre-oxygenation with 100% oxygen Induction Type: IV induction Ventilation: Mask ventilation without difficulty LMA: LMA inserted LMA Size: 4.0 Tube type: Oral Number of attempts: 1 Placement Confirmation: positive ETCO2 and breath sounds checked- equal and bilateral Tube secured with: Tape Dental Injury: Teeth and Oropharynx as per pre-operative assessment

## 2024-01-29 ENCOUNTER — Encounter (HOSPITAL_BASED_OUTPATIENT_CLINIC_OR_DEPARTMENT_OTHER): Payer: Self-pay | Admitting: Obstetrics and Gynecology

## 2024-02-01 LAB — SURGICAL PATHOLOGY

## 2024-02-03 ENCOUNTER — Ambulatory Visit
Admission: RE | Admit: 2024-02-03 | Discharge: 2024-02-03 | Disposition: A | Discharge status: Home / Self Care | Payer: Medicare Other | Source: Ambulatory Visit | Attending: Obstetrics and Gynecology | Admitting: Obstetrics and Gynecology

## 2024-02-03 ENCOUNTER — Other Ambulatory Visit: Payer: Self-pay | Admitting: Obstetrics and Gynecology

## 2024-02-03 DIAGNOSIS — R928 Other abnormal and inconclusive findings on diagnostic imaging of breast: Secondary | ICD-10-CM

## 2024-02-03 DIAGNOSIS — N6489 Other specified disorders of breast: Secondary | ICD-10-CM

## 2024-02-04 ENCOUNTER — Encounter: Payer: Self-pay | Admitting: Gastroenterology

## 2024-02-04 ENCOUNTER — Ambulatory Visit: Payer: Medicare Other | Admitting: Gastroenterology

## 2024-02-04 ENCOUNTER — Ambulatory Visit
Admission: RE | Admit: 2024-02-04 | Discharge: 2024-02-04 | Disposition: A | Discharge status: Home / Self Care | Payer: Medicare Other | Source: Ambulatory Visit | Attending: Obstetrics and Gynecology | Admitting: Obstetrics and Gynecology

## 2024-02-04 VITALS — BP 94/70 | HR 104 | Ht 65.5 in | Wt 168.4 lb

## 2024-02-04 DIAGNOSIS — R12 Heartburn: Secondary | ICD-10-CM | POA: Diagnosis not present

## 2024-02-04 DIAGNOSIS — K625 Hemorrhage of anus and rectum: Secondary | ICD-10-CM | POA: Diagnosis not present

## 2024-02-04 DIAGNOSIS — N6489 Other specified disorders of breast: Secondary | ICD-10-CM

## 2024-02-04 DIAGNOSIS — R112 Nausea with vomiting, unspecified: Secondary | ICD-10-CM | POA: Diagnosis not present

## 2024-02-04 DIAGNOSIS — K5909 Other constipation: Secondary | ICD-10-CM | POA: Diagnosis not present

## 2024-02-04 HISTORY — PX: BREAST BIOPSY: SHX20

## 2024-02-04 MED ORDER — PEG 3350-KCL-NA BICARB-NACL 420 G PO SOLR: 4000.0000 mL | Freq: Once | ORAL | 0 refills | Status: AC

## 2024-02-04 MED ORDER — GOLYTELY 236 G PO SOLR: 4000.0000 mL | Freq: Once | ORAL | 0 refills | Status: AC

## 2024-02-04 NOTE — Patient Instructions (Addendum)
You have been scheduled for a colonoscopy/endoscopy . Please follow written instructions given to you at your visit today.   If you use inhalers (even only as needed), please bring them with you on the day of your procedure.  DO NOT TAKE 7 DAYS PRIOR TO TEST- Trulicity (dulaglutide) Ozempic, Wegovy (semaglutide) Mounjaro (tirzepatide) Bydureon Bcise (exanatide extended release)  DO NOT TAKE 1 DAY PRIOR TO YOUR TEST Rybelsus (semaglutide) Adlyxin (lixisenatide) Victoza (liraglutide) Byetta (exanatide) ___________________________________________________________________________  Bonita Quin will receive your bowel preparation through [facility], which ensures the lowest copay and home delivery, with outreach via text or call from an [phone] number. Please respond promptly to avoid rescheduling of your procedure. If you are interested in alternative options or have any questions regarding your prep, please contact them at [phone] ____________________________________________________________________________  Your Provider Has Sent Your Bowel Prep Regimen To [facility]   [facility] will contact you to verify your information and collect your copay, if applicable. Enjoy the comfort of your home while your prescription is mailed to you, FREE of any shipping charges.   [facility] accepts all major insurance benefits and applies discounts & coupons.  Have additional questions?   Chat: [email or web address] Call: [phone] Email: [email or web address] NCPDP: [id number]  How will [facility] contact you?  With a Welcome phone call,  a Welcome text and a checkout link in text form.  Texts you receive from [phone] Are NOT Spam.  *To set up delivery, you must complete the checkout process via link or speak to one of the patient care representatives. If [facility] is unable to reach you, your prescription may be delayed.  To avoid long hold times on the phone, you may also utilize the  secure chat feature on the [facility] website to request that they call you back for transaction completion or to expedite your concerns.   _______________________________________________________  If your blood pressure at your visit was 140/90 or greater, please contact your primary care physician to follow up on this.  _______________________________________________________  If you are age 62 or older, your body mass index should be between 23-30. Your Body mass index is 27.59 kg/m. If this is out of the aforementioned range listed, please consider follow up with your Primary Care Provider.  If you are age 42 or younger, your body mass index should be between 19-25. Your Body mass index is 27.59 kg/m. If this is out of the aformentioned range listed, please consider follow up with your Primary Care Provider.   ________________________________________________________  The [place] [facility] providers would like to encourage you to use [employer] to communicate with providers for non-urgent requests or questions.  Due to long hold times on the telephone, sending your provider a message by [facility] may be a faster and more efficient way to get a response.  Please allow 48 business hours for a response.  Please remember that this is for non-urgent requests.  _______________________________________________________ It was a pleasure to see you today!  Thank you for trusting me with your gastrointestinal care!
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
## 2024-02-07 LAB — SURGICAL PATHOLOGY

## 2024-02-09 ENCOUNTER — Other Ambulatory Visit: Payer: Medicare Other

## 2024-03-03 HISTORY — PX: LAPAROSCOPIC HYSTERECTOMY: SHX1926

## 2024-03-08 ENCOUNTER — Telehealth: Payer: Self-pay | Admitting: Gastroenterology

## 2024-03-08 ENCOUNTER — Encounter: Payer: Self-pay | Admitting: Gastroenterology

## 2024-03-08 NOTE — Telephone Encounter (Signed)
 Inbound call from patient sister states someone from our office called patient and left a message with patient mother for patient to pick up prep from pharmacy.   Sister states there could possible be a misunderstanding due to the fact they have already picked up patients prescription from patient preferred pharmacy.   Verified patient is still scheduled for procedure for tomorrow.   Requesting a call back to speak with someone in regards. Please advise.   Thank you

## 2024-03-08 NOTE — Telephone Encounter (Signed)
 RTN call to [facility]).  She states someone named Bri called patient's mother today and informed her pt needs to pick up her prep or procedure would be cancelled.  Sister is very upset and states patient has cognitive delays and the family is under a lot of stress right now.  Sister states she has already picked up Golytely and Dulcolax for prep, states she informed office they did not want to go through [facility] for RX's.  RN apologized for any misunderstanding.  She was reassured the procedure would not  be cancelled and is currently scheduled for 03/16/24 @1 :30.  Sister is requesting to speak to someone regarding how the endo/colon would be coded.  She was transferred to the front desk for assistance. SChaplin, RN,BSN

## 2024-03-13 NOTE — Telephone Encounter (Signed)
 Left message for patient to call back

## 2024-03-13 NOTE — Telephone Encounter (Signed)
 Millie returning call to advise that they are still receiving calls as of last week from Bri saying that the patient's procedure will be canceled if they don't pick up medication with [facility]. Prep has already been picked up but they are appalled  by how "rude and aggressive" she has been regarding this procedure. I did advise that the procedure is still scheduled and wishes to speak with someone higher. Please advise.

## 2024-03-16 ENCOUNTER — Ambulatory Visit: Payer: Medicare Other | Admitting: Gastroenterology

## 2024-03-16 ENCOUNTER — Encounter: Payer: Self-pay | Admitting: Gastroenterology

## 2024-03-16 VITALS — BP 118/88 | HR 77 | Temp 97.3°F | Resp 13 | Ht 65.5 in | Wt 168.0 lb

## 2024-03-16 DIAGNOSIS — K317 Polyp of stomach and duodenum: Secondary | ICD-10-CM

## 2024-03-16 DIAGNOSIS — K449 Diaphragmatic hernia without obstruction or gangrene: Secondary | ICD-10-CM

## 2024-03-16 DIAGNOSIS — K2210 Ulcer of esophagus without bleeding: Secondary | ICD-10-CM

## 2024-03-16 DIAGNOSIS — K319 Disease of stomach and duodenum, unspecified: Secondary | ICD-10-CM | POA: Diagnosis not present

## 2024-03-16 DIAGNOSIS — D123 Benign neoplasm of transverse colon: Secondary | ICD-10-CM | POA: Diagnosis not present

## 2024-03-16 DIAGNOSIS — D120 Benign neoplasm of cecum: Secondary | ICD-10-CM

## 2024-03-16 DIAGNOSIS — K3189 Other diseases of stomach and duodenum: Secondary | ICD-10-CM

## 2024-03-16 DIAGNOSIS — K6289 Other specified diseases of anus and rectum: Secondary | ICD-10-CM | POA: Diagnosis not present

## 2024-03-16 DIAGNOSIS — K635 Polyp of colon: Secondary | ICD-10-CM | POA: Diagnosis not present

## 2024-03-16 DIAGNOSIS — R112 Nausea with vomiting, unspecified: Secondary | ICD-10-CM

## 2024-03-16 DIAGNOSIS — K625 Hemorrhage of anus and rectum: Secondary | ICD-10-CM | POA: Diagnosis not present

## 2024-03-16 DIAGNOSIS — R12 Heartburn: Secondary | ICD-10-CM

## 2024-03-16 DIAGNOSIS — K5909 Other constipation: Secondary | ICD-10-CM

## 2024-03-16 DIAGNOSIS — R195 Other fecal abnormalities: Secondary | ICD-10-CM

## 2024-03-16 MED ORDER — SODIUM CHLORIDE 0.9 % IV SOLN: 500.0000 mL | Freq: Once | INTRAVENOUS | Status: DC

## 2024-03-16 NOTE — Op Note (Signed)
 [facility] Patient Name: Charlotte [name] Procedure Date: 03/16/2024 2:37 PM MRN: [id number] Endoscopist: Sherilyn Cooter L. Myrtie Neither , MD, [id number] Age: 58 Referring MD:  Date of Birth: 1966-12-06 Gender: Female Account #: [id number] Procedure:                Colonoscopy Indications:              Positive Cologuard test, Constipation Medicines:                Monitored Anesthesia Care Procedure:                Pre-Anesthesia Assessment:                           - Prior to the procedure, a History and Physical                            was performed, and patient medications and                            allergies were reviewed. The patient's tolerance of                            previous anesthesia was also reviewed. The risks                            and benefits of the procedure and the sedation                            options and risks were discussed with the patient.                            All questions were answered, and informed consent                            was obtained. Prior Anticoagulants: The patient has                            taken no anticoagulant or antiplatelet agents. ASA                            Grade Assessment: III - A patient with severe                            systemic disease. After reviewing the risks and                            benefits, the patient was deemed in satisfactory                            condition to undergo the procedure.                           After obtaining informed consent, the colonoscope  was passed under direct vision. Throughout the                            procedure, the patient's blood pressure, pulse, and                            oxygen saturations were monitored continuously. The                            Olympus Scope [id number] was introduced through the                            anus and advanced to the the cecum, identified by                            appendiceal  orifice and ileocecal valve. The                            colonoscopy was performed without difficulty. The                            patient tolerated the procedure well. The quality                            of the bowel preparation was excellent. The                            ileocecal valve, appendiceal orifice, and rectum                            were photographed. Scope In: 3:02:29 PM Scope Out: 3:18:36 PM Scope Withdrawal Time: 0 hours 11 minutes 8 seconds  Total Procedure Duration: 0 hours 16 minutes 7 seconds  Findings:                 The digital rectal exam findings include decreased                            sphincter tone.                           Repeat examination of right colon under NBI                            performed.                           A 10 mm polyp was found in the cecum. The polyp was                            mucous-capped and semi-sessile. The polyp was                            removed with a cold snare. Resection and retrieval  were complete.                           A diminutive polyp was found in the transverse                            colon. The polyp was sessile. The polyp was removed                            with a cold snare. Resection and retrieval were                            complete.                           The exam was otherwise without abnormality on                            direct and retroflexion views. Complications:            No immediate complications. Estimated Blood Loss:     Estimated blood loss was minimal. Impression:               - Decreased sphincter tone found on digital rectal                            exam.                           - One 10 mm polyp in the cecum, removed with a cold                            snare. Resected and retrieved.                           - One diminutive polyp in the transverse colon,                            removed with a cold snare.  Resected and retrieved.                           - The examination was otherwise normal on direct                            and retroflexion views. Recommendation:           - Patient has a contact number available for                            emergencies. The signs and symptoms of potential                            delayed complications were discussed with the                            patient. Return to normal activities tomorrow.  Written discharge instructions were provided to the                            patient.                           - Resume previous diet.                           - Continue present medications.                           - Await pathology results.                           - Repeat colonoscopy is recommended for                            surveillance. The colonoscopy date will be                            determined after pathology results from today's                            exam become available for review.                           - See the other procedure note for documentation of                            additional recommendations.                           - MiraLAX 1 capful in a glass of water daily,                            adjusting dose as needed to relieve constipation                            without making stool too loose. Titianna Loomis L. Myrtie Neither, MD 03/16/2024 3:36:35 PM This report has been signed electronically.

## 2024-03-16 NOTE — Progress Notes (Signed)
 A/o x 3, VSS, gd SR's, pleased with anesthesia, report to RN

## 2024-03-16 NOTE — Op Note (Signed)
 [facility] Patient Name: Charlotte [name] Procedure Date: 03/16/2024 2:37 PM MRN: [id number] Endoscopist: Sherilyn Cooter L. Myrtie Neither , MD, [id number] Age: 57 Referring MD:  Date of Birth: 1966-05-30 Gender: Female Account #: [id number] Procedure:                Upper GI endoscopy Indications:              Nausea with vomiting                           Clinic details in 02/04/2024 office consult note Medicines:                Monitored Anesthesia Care Procedure:                Pre-Anesthesia Assessment:                           - Prior to the procedure, a History and Physical                            was performed, and patient medications and                            allergies were reviewed. The patient's tolerance of                            previous anesthesia was also reviewed. The risks                            and benefits of the procedure and the sedation                            options and risks were discussed with the patient.                            All questions were answered, and informed consent                            was obtained. Prior Anticoagulants: The patient has                            taken no anticoagulant or antiplatelet agents. ASA                            Grade Assessment: III - A patient with severe                            systemic disease. After reviewing the risks and                            benefits, the patient was deemed in satisfactory                            condition to undergo the procedure.  After obtaining informed consent, the endoscope was                            passed under direct vision. Throughout the                            procedure, the patient's blood pressure, pulse, and                            oxygen saturations were monitored continuously. The                            GIF [id number] #[id number] was introduced through the                            mouth, and advanced to the second  part of duodenum.                            The upper GI endoscopy was accomplished without                            difficulty. The patient tolerated the procedure                            well. Scope In: Scope Out: Findings:                 A few small erosions were found in the lower third                            of the esophagus.                           A single 12 mm sessile polypoid abnormality with no                            stigmata of recent bleeding was found in the                            cardia. It was seen on both forward view and                            retroflexion (see photos). It was best seen on                            forward view when a sliding small hiatal hernia was                            briefly evident. This finding seem to be contiguous                            with a circumferential nodular abnormality of the  EG junction with similar appearance. Biopsies were                            taken with a cold forceps for histology (from the                            polypoid portion and also at the EG junction).                           Otherwise normal mucosa was found in the entire                            examined stomach. Biopsies were taken with a cold                            forceps for histology( from the antrum and body in                            1 jar to rule out H. pylori).                           The exam of the stomach was otherwise normal, and                            distended well with insufflation. Scope passed                            through a patent pylorus without resistance.                           The examined duodenum was normal. Complications:            No immediate complications. Estimated Blood Loss:     Estimated blood loss was minimal. Impression:               - A few erosions in the lower third of the                            esophagus.                            - A single gastric polyp. Biopsied.                           - Normal mucosa was found in the entire stomach.                            Biopsied.                           - Normal examined duodenum.                           Significance of this EGJ/gastric cardia mucosal  finding uncertain. It may be polypoid inflamed                            tissue from the patient's regular episodes of                            vomiting,, particularly given the reported symptoms                            and the distal esophageal erosive findings.                           The cause of vomiting is still uncertain. It often                            occurs at night or early morning, sometimes                            associated with bowel movements and difficulty with                            constipation. Also possibly medication side effect. Recommendation:           - Patient has a contact number available for                            emergencies. The signs and symptoms of potential                            delayed complications were discussed with the                            patient. Return to normal activities tomorrow.                            Written discharge instructions were provided to the                            patient.                           - Resume previous diet.                           - Continue present medications.                           - Await pathology results. If H. pylori negative                            and no neoplasia and gastric cardia biopsies,                            schedule gastric emptying study.                           -  See the other procedure note for documentation of                            additional recommendations. Giorgia Wahler L. Myrtie Neither, MD 03/16/2024 3:33:17 PM This report has been signed electronically.

## 2024-03-16 NOTE — Patient Instructions (Addendum)
 - Resume previous diet - Continue present medications. - Await pathology results - Patient given educational handouts related to procedure. - MiraLAX 1 capful in a glass of water daily, adjusting dose as needed to relieve constipation without making stool too loose.   YOU HAD AN ENDOSCOPIC PROCEDURE TODAY AT THE [facility]:   Refer to the procedure report that was given to you for any specific questions about what was found during the examination.  If the procedure report does not answer your questions, please call your gastroenterologist to clarify.  If you requested that your care partner not be given the details of your procedure findings, then the procedure report has been included in a sealed envelope for you to review at your convenience later.  YOU SHOULD EXPECT: Some feelings of bloating in the abdomen. Passage of more gas than usual.  Walking can help get rid of the air that was put into your GI tract during the procedure and reduce the bloating. If you had a lower endoscopy (such as a colonoscopy or flexible sigmoidoscopy) you may notice spotting of blood in your stool or on the toilet paper. If you underwent a bowel prep for your procedure, you may not have a normal bowel movement for a few days.  Please Note:  You might notice some irritation and congestion in your nose or some drainage.  This is from the oxygen used during your procedure.  There is no need for concern and it should clear up in a day or so.  SYMPTOMS TO REPORT IMMEDIATELY:  Following lower endoscopy (colonoscopy or flexible sigmoidoscopy):  Excessive amounts of blood in the stool  Significant tenderness or worsening of abdominal pains  Swelling of the abdomen that is new, acute  Fever of 100F or higher  Following upper endoscopy (EGD)  Vomiting of blood or coffee ground material  New chest pain or pain under the shoulder blades  Painful or persistently difficult swallowing  New shortness of  breath  Fever of 100F or higher  Black, tarry-looking stools  For urgent or emergent issues, a gastroenterologist can be reached at any hour by calling [phone]. Do not use MyChart messaging for urgent concerns.    DIET:  We do recommend a small meal at first, but then you may proceed to your regular diet.  Drink plenty of fluids but you should avoid alcoholic beverages for 24 hours.  ACTIVITY:  You should plan to take it easy for the rest of today and you should NOT DRIVE or use heavy machinery until tomorrow (because of the sedation medicines used during the test).    FOLLOW UP: Our staff will call the number listed on your records the next business day following your procedure.  We will call around 7:15- 8:00 am to check on you and address any questions or concerns that you may have regarding the information given to you following your procedure. If we do not reach you, we will leave a message.     If any biopsies were taken you will be contacted by phone or by letter within the next 1-3 weeks.  Please call us at [phone] if you have not heard about the biopsies in 3 weeks.    SIGNATURES/CONFIDENTIALITY: You and/or your care partner have signed paperwork which will be entered into your electronic medical record.  These signatures attest to the fact that that the information above on your After Visit Summary has been reviewed and is understood.  Full responsibility  of the confidentiality of this discharge information lies with you and/or your care-partner.

## 2024-03-16 NOTE — Progress Notes (Signed)
 Pt's states no medical or surgical changes since previsit or office visit.

## 2024-03-16 NOTE — Progress Notes (Signed)
 Called to room to assist during endoscopic procedure.  Patient ID and intended procedure confirmed with present staff. Received instructions for my participation in the procedure from the performing physician.
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
## 2024-03-17 ENCOUNTER — Telehealth: Payer: Self-pay

## 2024-03-17 NOTE — Telephone Encounter (Signed)
  Follow up Call-     03/16/2024    1:38 PM  Call back number  Post procedure Call Back phone  # [phone]  Permission to leave phone message Yes     Patient questions:  Do you have a fever, pain , or abdominal swelling? No. Pain Score  0 *  Have you tolerated food without any problems? Yes.    Have you been able to return to your normal activities? Yes.    Do you have any questions about your discharge instructions: Diet   No. Medications  No. Follow up visit  No.  Do you have questions or concerns about your Care? No.  Actions: * If pain score is 4 or above: No action needed, pain <4.

## 2024-03-22 LAB — SURGICAL PATHOLOGY

## 2024-03-23 ENCOUNTER — Other Ambulatory Visit: Payer: Self-pay

## 2024-03-23 DIAGNOSIS — R112 Nausea with vomiting, unspecified: Secondary | ICD-10-CM

## 2024-04-10 ENCOUNTER — Telehealth: Payer: Self-pay

## 2024-04-10 NOTE — Telephone Encounter (Signed)
 FYI Dr Gaither Juba called back this morning about NM GES. She stated her daughter was getting ready to start Chemo at [facility] and would get this test ordered by them if still needed.   Appt has been cancelled

## 2024-04-11 NOTE — Telephone Encounter (Signed)
 Thank you for the update.  Memory Staggers MD

## 2024-05-24 ENCOUNTER — Encounter: Payer: Self-pay | Admitting: Gastroenterology

## 2024-05-25 ENCOUNTER — Ambulatory Visit: Admitting: Physician Assistant

## 2024-07-10 ENCOUNTER — Other Ambulatory Visit: Payer: Self-pay

## 2024-07-10 ENCOUNTER — Other Ambulatory Visit: Payer: Self-pay | Admitting: Gynecology

## 2024-07-10 DIAGNOSIS — M79602 Pain in left arm: Secondary | ICD-10-CM

## 2024-07-12 ENCOUNTER — Other Ambulatory Visit
